# Patient Record
Sex: Female | Born: 1935 | Race: Black or African American | Hispanic: No | Smoking: Never smoker
Health system: Southern US, Community
[De-identification: ages and names within clinical notes are randomized; demographics above are authoritative.]

## PROBLEM LIST (undated history)

## (undated) DIAGNOSIS — Z9289 Personal history of other medical treatment: Secondary | ICD-10-CM

## (undated) DIAGNOSIS — R55 Syncope and collapse: Secondary | ICD-10-CM

## (undated) DIAGNOSIS — R0902 Hypoxemia: Secondary | ICD-10-CM

## (undated) DIAGNOSIS — E049 Nontoxic goiter, unspecified: Secondary | ICD-10-CM

## (undated) DIAGNOSIS — L309 Dermatitis, unspecified: Secondary | ICD-10-CM

## (undated) DIAGNOSIS — S065X9A Traumatic subdural hemorrhage with loss of consciousness of unspecified duration, initial encounter: Secondary | ICD-10-CM

## (undated) DIAGNOSIS — I480 Paroxysmal atrial fibrillation: Principal | ICD-10-CM

## (undated) DIAGNOSIS — N329 Bladder disorder, unspecified: Secondary | ICD-10-CM

## (undated) DIAGNOSIS — E039 Hypothyroidism, unspecified: Secondary | ICD-10-CM

## (undated) DIAGNOSIS — E785 Hyperlipidemia, unspecified: Secondary | ICD-10-CM

## (undated) DIAGNOSIS — M1990 Unspecified osteoarthritis, unspecified site: Secondary | ICD-10-CM

## (undated) DIAGNOSIS — S065XAA Traumatic subdural hemorrhage with loss of consciousness status unknown, initial encounter: Secondary | ICD-10-CM

## (undated) DIAGNOSIS — I495 Sick sinus syndrome: Secondary | ICD-10-CM

## (undated) HISTORY — DX: Traumatic subdural hemorrhage with loss of consciousness status unknown, initial encounter: S06.5XAA

## (undated) HISTORY — PX: APPENDECTOMY: SHX54

## (undated) HISTORY — DX: Hyperlipidemia, unspecified: E78.5

## (undated) HISTORY — PX: OTHER SURGICAL HISTORY: SHX169

## (undated) HISTORY — DX: Personal history of other medical treatment: Z92.89

## (undated) HISTORY — PX: ABDOMINAL HYSTERECTOMY: SHX81

## (undated) HISTORY — DX: Paroxysmal atrial fibrillation: I48.0

## (undated) HISTORY — DX: Traumatic subdural hemorrhage with loss of consciousness of unspecified duration, initial encounter: S06.5X9A

## (undated) HISTORY — DX: Syncope and collapse: R55

## (undated) HISTORY — DX: Hypothyroidism, unspecified: E03.9

## (undated) HISTORY — DX: Nontoxic goiter, unspecified: E04.9

## (undated) HISTORY — DX: Hypoxemia: R09.02

---

## 1998-12-26 ENCOUNTER — Encounter: Admission: RE | Admit: 1998-12-26 | Discharge: 1998-12-26 | Payer: Self-pay | Admitting: Endocrinology

## 1998-12-26 ENCOUNTER — Encounter: Payer: Self-pay | Admitting: Obstetrics

## 2000-01-29 ENCOUNTER — Encounter: Payer: Self-pay | Admitting: Endocrinology

## 2000-01-29 ENCOUNTER — Encounter: Admission: RE | Admit: 2000-01-29 | Discharge: 2000-01-29 | Payer: Self-pay | Admitting: Endocrinology

## 2001-03-10 ENCOUNTER — Encounter: Payer: Self-pay | Admitting: Endocrinology

## 2001-03-10 ENCOUNTER — Encounter: Admission: RE | Admit: 2001-03-10 | Discharge: 2001-03-10 | Payer: Self-pay | Admitting: Endocrinology

## 2001-10-07 ENCOUNTER — Encounter: Payer: Self-pay | Admitting: Endocrinology

## 2001-10-07 ENCOUNTER — Encounter: Admission: RE | Admit: 2001-10-07 | Discharge: 2001-10-07 | Payer: Self-pay | Admitting: Endocrinology

## 2001-10-20 ENCOUNTER — Other Ambulatory Visit: Admission: RE | Admit: 2001-10-20 | Discharge: 2001-10-20 | Payer: Self-pay | Admitting: Obstetrics and Gynecology

## 2001-12-24 ENCOUNTER — Encounter: Payer: Self-pay | Admitting: Urology

## 2001-12-24 ENCOUNTER — Encounter: Admission: RE | Admit: 2001-12-24 | Discharge: 2001-12-24 | Payer: Self-pay | Admitting: Urology

## 2002-02-25 ENCOUNTER — Encounter: Admission: RE | Admit: 2002-02-25 | Discharge: 2002-02-25 | Payer: Self-pay | Admitting: Endocrinology

## 2002-02-25 ENCOUNTER — Encounter: Payer: Self-pay | Admitting: Endocrinology

## 2002-03-16 ENCOUNTER — Encounter: Payer: Self-pay | Admitting: Endocrinology

## 2002-03-16 ENCOUNTER — Encounter: Admission: RE | Admit: 2002-03-16 | Discharge: 2002-03-16 | Payer: Self-pay | Admitting: Endocrinology

## 2002-12-02 ENCOUNTER — Encounter: Admission: RE | Admit: 2002-12-02 | Discharge: 2002-12-02 | Payer: Self-pay | Admitting: Endocrinology

## 2003-03-24 ENCOUNTER — Encounter: Admission: RE | Admit: 2003-03-24 | Discharge: 2003-03-24 | Payer: Self-pay | Admitting: Endocrinology

## 2004-04-23 ENCOUNTER — Ambulatory Visit (HOSPITAL_COMMUNITY): Admission: RE | Admit: 2004-04-23 | Discharge: 2004-04-23 | Payer: Self-pay | Admitting: Endocrinology

## 2004-04-24 ENCOUNTER — Encounter: Admission: RE | Admit: 2004-04-24 | Discharge: 2004-04-24 | Payer: Self-pay | Admitting: Endocrinology

## 2005-04-29 ENCOUNTER — Encounter: Admission: RE | Admit: 2005-04-29 | Discharge: 2005-04-29 | Payer: Self-pay | Admitting: Endocrinology

## 2005-07-18 ENCOUNTER — Ambulatory Visit (HOSPITAL_COMMUNITY): Admission: RE | Admit: 2005-07-18 | Discharge: 2005-07-18 | Payer: Self-pay | Admitting: Endocrinology

## 2006-05-05 ENCOUNTER — Encounter: Admission: RE | Admit: 2006-05-05 | Discharge: 2006-05-05 | Payer: Self-pay | Admitting: Endocrinology

## 2006-09-02 ENCOUNTER — Encounter: Payer: Self-pay | Admitting: Cardiology

## 2006-10-06 ENCOUNTER — Ambulatory Visit (HOSPITAL_COMMUNITY): Admission: RE | Admit: 2006-10-06 | Discharge: 2006-10-06 | Payer: Self-pay | Admitting: Endocrinology

## 2007-03-31 ENCOUNTER — Encounter: Payer: Self-pay | Admitting: Cardiology

## 2007-06-25 ENCOUNTER — Encounter: Admission: RE | Admit: 2007-06-25 | Discharge: 2007-06-25 | Payer: Self-pay | Admitting: Endocrinology

## 2008-07-27 ENCOUNTER — Ambulatory Visit (HOSPITAL_COMMUNITY): Admission: RE | Admit: 2008-07-27 | Discharge: 2008-07-27 | Payer: Self-pay | Admitting: Endocrinology

## 2008-12-22 ENCOUNTER — Encounter: Payer: Self-pay | Admitting: Cardiology

## 2009-01-04 ENCOUNTER — Encounter: Payer: Self-pay | Admitting: Cardiology

## 2009-01-05 ENCOUNTER — Encounter: Admission: RE | Admit: 2009-01-05 | Discharge: 2009-01-05 | Payer: Self-pay | Admitting: Endocrinology

## 2009-01-26 ENCOUNTER — Other Ambulatory Visit: Admission: RE | Admit: 2009-01-26 | Discharge: 2009-01-26 | Payer: Self-pay | Admitting: Endocrinology

## 2009-03-21 ENCOUNTER — Encounter: Payer: Self-pay | Admitting: Cardiology

## 2009-07-31 ENCOUNTER — Encounter: Payer: Self-pay | Admitting: Cardiology

## 2009-08-15 ENCOUNTER — Encounter: Admission: RE | Admit: 2009-08-15 | Discharge: 2009-08-15 | Payer: Self-pay | Admitting: Endocrinology

## 2010-01-05 ENCOUNTER — Encounter
Admission: RE | Admit: 2010-01-05 | Discharge: 2010-01-05 | Payer: Self-pay | Source: Home / Self Care | Attending: Endocrinology | Admitting: Endocrinology

## 2010-01-14 HISTORY — PX: CATARACT EXTRACTION W/ INTRAOCULAR LENS  IMPLANT, BILATERAL: SHX1307

## 2010-02-03 ENCOUNTER — Encounter: Payer: Self-pay | Admitting: Endocrinology

## 2010-03-07 DIAGNOSIS — R42 Dizziness and giddiness: Secondary | ICD-10-CM

## 2010-03-07 HISTORY — DX: Dizziness and giddiness: R42

## 2010-03-08 ENCOUNTER — Encounter: Payer: Self-pay | Admitting: Cardiology

## 2010-03-08 ENCOUNTER — Ambulatory Visit (INDEPENDENT_AMBULATORY_CARE_PROVIDER_SITE_OTHER): Payer: Medicare Other | Admitting: Cardiology

## 2010-03-08 DIAGNOSIS — R072 Precordial pain: Secondary | ICD-10-CM

## 2010-03-08 DIAGNOSIS — R011 Cardiac murmur, unspecified: Secondary | ICD-10-CM | POA: Insufficient documentation

## 2010-03-08 DIAGNOSIS — E785 Hyperlipidemia, unspecified: Secondary | ICD-10-CM

## 2010-03-08 DIAGNOSIS — R079 Chest pain, unspecified: Secondary | ICD-10-CM | POA: Insufficient documentation

## 2010-03-08 DIAGNOSIS — E039 Hypothyroidism, unspecified: Secondary | ICD-10-CM

## 2010-03-08 HISTORY — DX: Hypothyroidism, unspecified: E03.9

## 2010-03-08 HISTORY — DX: Hyperlipidemia, unspecified: E78.5

## 2010-03-13 NOTE — Assessment & Plan Note (Signed)
Summary: NP6 CHEST [place] MURMUR/SELF REF/[facility]/[place] [facility] [address]...   CC:  chest pressure.  History of Present Illness: 77 year old female for evaluation of chest pain. Echocardiogram in December of 2000 and showed normal LV function with an ejection fraction of 50%. There was trace mitral and tricuspid regurgitation. There was mild biatrial enlargement. Exercise tolerance test in March of 2009 was negative per report. Patient has been seen by Dr. Pauline Aus in the past. She states that she has had a "tired" feeling in her chest intermittently for some years. She noticed this recently while walking in the mall and wanted to be evaluated. She denies chest pain, pressure or tightness. She has mild dyspnea on exertion relieved with rest. There is no orthopnea, PND, pedal edema or syncope. Because of the above we were asked to further evaluate.  Preventive Screening-Counseling & Management  Alcohol-Tobacco     Smoking Status: never  Current Medications (verified): 1)  Multivitamins   Tabs (Multiple Vitamin) .Marland Kitchen.. 1 Tab By Mouth Once Daily 2)  Vit C .... 1 Tab By Mouth Once Daily 3)  Vit D .... 1 Tab By Mouth Once Daily 4)  Fish Oil   Oil (Fish Oil) .Marland Kitchen.. 1 Tab By Mouth Three Times A Day 5)  Synthroid 88 Mcg Tabs (Levothyroxine Sodium) .Marland Kitchen.. 1 Tab By Mouth Once Daily 6)  Potassium .Marland Kitchen.. 1 Tab By Mouth Three Times A Day  Past History:  Past Medical History: Hyperlipidemia Hypothyroid Goiter Murmur Cholelithiasis  Past Surgical History: Appendectomy TAH and BSO Tonsillectomy  Family History: Father with CHF  Social History: Part Time Building control surveyor Married  Tobacco Use - No.  Alcohol Use - no Smoking Status:  never  Review of Systems       Hematuria in the past which she states her physician is aware of and dizziness after eating pork but no fevers or chills, productive cough, hemoptysis, dysphasia, odynophagia, melena, hematochezia, dysuria,  rash, seizure activity,  orthopnea, PND, pedal edema, claudication. Remaining systems are negative.   Vital Signs:  Patient profile:   77 year old female Height:      67 inches Weight:      169 pounds BMI:     26.56 Pulse rate:   90 / minute Resp:     14 per minute BP sitting:   120 / 80  (right arm)  Vitals Entered By: Burnett Kanaris (March 08, 2010 11:58 AM)  Physical Exam  General:  Well developed/well nourished in NAD Skin warm/dry Patient not depressed No peripheral clubbing Back-normal HEENT-normal/normal eyelids Neck supple/normal carotid upstroke bilaterally; no bruits; no JVD; no thyromegaly chest - CTA/ normal expansion CV - RRR/normal S1 and S2; no murmurs, rubs or gallops;  PMI nondisplaced Abdomen -NT/ND, no HSM, no mass, + bowel sounds, no bruit 2+ femoral pulses, no bruits Ext-no edema, chords, 2+ DP Neuro-grossly nonfocal     EKG  Procedure date:  03/08/2010  Findings:      Sinus rhythm, occasional PVCs, left axis deviation, left atrial enlargement, RV conduction delay.  Impression & Recommendations:  Problem # 1:  CHEST PAIN (ICD-786.50) Symptoms are atypical. Schedule Myoview for risk stratification. Orders: Nuclear Stress Test (Nuc Stress Test)  Problem # 2:  DIZZINESS (ICD-780.4) Patient has had occasional dizziness in the past but only after eating pork. No further evaluation.  Problem # 3:  HYPOTHYROIDISM (ICD-244.9)  Her updated medication list for this problem includes:    Synthroid 88 Mcg Tabs (Levothyroxine sodium) .Marland Kitchen... 1 tab by mouth once  daily  Problem # 4:  HYPERLIPIDEMIA (ICD-272.4) Management per primary care.  Patient Instructions: 1)  Your physician has requested that you have an exercise stress myoview.  For further information please visit [email or web address].  Please follow instruction sheet, as given.

## 2010-03-23 ENCOUNTER — Telehealth (INDEPENDENT_AMBULATORY_CARE_PROVIDER_SITE_OTHER): Payer: Self-pay | Admitting: *Deleted

## 2010-03-26 ENCOUNTER — Ambulatory Visit (HOSPITAL_COMMUNITY): Payer: Medicare Other | Attending: Cardiology

## 2010-03-26 ENCOUNTER — Encounter: Payer: Self-pay | Admitting: Cardiology

## 2010-03-26 DIAGNOSIS — R0609 Other forms of dyspnea: Secondary | ICD-10-CM

## 2010-03-26 DIAGNOSIS — R0989 Other specified symptoms and signs involving the circulatory and respiratory systems: Secondary | ICD-10-CM

## 2010-03-26 DIAGNOSIS — R079 Chest pain, unspecified: Secondary | ICD-10-CM | POA: Insufficient documentation

## 2010-03-26 DIAGNOSIS — R0789 Other chest pain: Secondary | ICD-10-CM

## 2010-03-27 NOTE — Letter (Signed)
Summary: [facility]   Imported By: Marilynne Drivers 03/19/2010 09:57:41  _____________________________________________________________________  External Attachment:    Type:   Image     Comment:   External Document

## 2010-03-27 NOTE — Progress Notes (Signed)
Summary: Nuclear Pre-Procedure  Phone Note Outgoing Call Call back at Home Phone [phone]   Call placed by: Eliezer Lofts, EMT-P,  March 23, 2010 9:19 AM Action Taken: Phone Call Completed Summary of Call: Left message with information on Myoview Information Sheet (see scanned document for details). Eliezer Lofts, EMT-P  March 23, 2010 9:19 AM      Nuclear Med Background Indications for Stress Test: Evaluation for Ischemia   History: Echo, GXT  History Comments: GXT: (-) '00 Echo: EF=50%  Symptoms: Chest Pain, Chest Pain with Exertion, Dizziness, DOE    Nuclear Pre-Procedure Cardiac Risk Factors: Lipids Height (in): 67

## 2010-04-03 NOTE — Assessment & Plan Note (Signed)
Summary: Cardiology Nuclear Testing  Nuclear Med Background Indications for Stress Test: Evaluation for Ischemia   History: Echo, GXT, Myocardial Perfusion Study  History Comments: ?4 yrs. ago MPS:OK per patient; '09 LOV:FIEPPI; '00 Echo:EF=50%  Symptoms: Chest Pressure, Chest Pressure with Exertion, DOE  Symptoms Comments: Last episode of CP:2 weeks ago.   Nuclear Pre-Procedure Cardiac Risk Factors: Lipids Caffeine/Decaff Intake: None NPO After: 10:00 PM Lungs: Clear IV 0.9% NS with Angio Cath: 22g     IV Site: R Antecubital IV Started by: Irven Baltimore, RN Chest Size (in) 38     Cup Size B     Height (in): 67 Weight (lb): 169 BMI: 26.56  Nuclear Med Study 1 or 2 day study:  1 day     Stress Test Type:  Stress Reading MD:  Kirk Ruths, MD     Referring MD:  Kirk Ruths, MD Resting Radionuclide:  Technetium 69mTetrofosmin     Resting Radionuclide Dose:  10.8 mCi  Stress Radionuclide:  Technetium 93metrofosmin     Stress Radionuclide Dose:  33 mCi   Stress Protocol Exercise Time (min):  4:00 min     Max HR:  136 bpm     Predicted Max HR:  14951pm  Max Systolic BP: 17884m Hg     Percent Max HR:  93.79 %     METS: 5.8 Rate Pressure Product:  [id number]  Stress Test Technologist:  ShValetta FullerCMA-N     Nuclear Technologist:  ToAnnye RuskCNMT  Rest Procedure  Myocardial perfusion imaging was performed at rest 45 minutes following the intravenous administration of Technetium 9950mtrofosmin.  Stress Procedure  The patient exercised for four minutes.  The patient stopped due to fatigue and denied any chest pain, but did c/o right arm pain, 5-6/10..  Marland Kitchenhere were no significant ST-T wave changes, only frequent PVC's.  Technetium 37m58mrofosmin was injected at peak exercise and myocardial perfusion imaging was performed after a brief delay.  QPS Raw Data Images:  Acquisition technically good; normal left ventricular size. Stress Images:  There is decreased uptake in the  distal anterior wall. Rest Images:  There is decreased uptake in the distal anterior wall. Subtraction (SDS):  No evidence of ischemia. Transient Ischemic Dilatation:  .93  (Normal <1.22)  Lung/Heart Ratio:  .26  (Normal <0.45)  Quantitative Gated Spect Images QGS EDV:  74 ml QGS ESV:  22 ml QGS EF:  70 % QGS cine images:  Normal wall motion.   Overall Impression  Exercise Capacity: Fair exercise capacity. BP Response: Normal blood pressure response. Clinical Symptoms: Right arm pain. ECG Impression: No significant ST segment change suggestive of ischemia; frquent PVCs. Overall Impression: Normal stress nuclear study with mild soft tissue attenuation but no ischemia.  Appended Document: Cardiology Nuclear Testing ok  Appended Document: Cardiology Nuclear Testing pt aware of results

## 2010-04-03 NOTE — Letter (Signed)
Summary: [facility] Echo, EKG, 24 Hr Holter, myocardial Per  [facility] Echo, EKG, 24 Hr Holter, myocardial Perf 2007-2007   Imported By: Sallee Provencal 03/26/2010 09:24:34  _____________________________________________________________________  External Attachment:    Type:   Image     Comment:   External Document

## 2010-07-10 ENCOUNTER — Encounter: Payer: Self-pay | Admitting: Cardiology

## 2010-08-21 ENCOUNTER — Other Ambulatory Visit: Payer: Self-pay | Admitting: Endocrinology

## 2010-08-21 DIAGNOSIS — Z1231 Encounter for screening mammogram for malignant neoplasm of breast: Secondary | ICD-10-CM

## 2010-09-12 ENCOUNTER — Ambulatory Visit
Admission: RE | Admit: 2010-09-12 | Discharge: 2010-09-12 | Disposition: A | Discharge status: Home / Self Care | Payer: Medicare Other | Source: Ambulatory Visit | Attending: Endocrinology | Admitting: Endocrinology

## 2010-09-12 DIAGNOSIS — Z1231 Encounter for screening mammogram for malignant neoplasm of breast: Secondary | ICD-10-CM

## 2010-11-19 ENCOUNTER — Encounter (HOSPITAL_BASED_OUTPATIENT_CLINIC_OR_DEPARTMENT_OTHER): Payer: Self-pay | Admitting: *Deleted

## 2010-11-19 NOTE — Progress Notes (Signed)
NPO after MN with exception water/ gatorade until 0730 (no cream./ milk products). Pt to arrive 1215. Will take levothyroxine w/ sip of water. Needs hg . Ekg in epic ( ECG tab)

## 2010-11-21 ENCOUNTER — Ambulatory Visit (HOSPITAL_BASED_OUTPATIENT_CLINIC_OR_DEPARTMENT_OTHER)
Admission: RE | Admit: 2010-11-21 | Discharge: 2010-11-21 | Disposition: A | Discharge status: Home / Self Care | Payer: Medicare Other | Source: Ambulatory Visit | Attending: Urology | Admitting: Urology

## 2010-11-21 ENCOUNTER — Other Ambulatory Visit: Payer: Self-pay | Admitting: Urology

## 2010-11-21 ENCOUNTER — Ambulatory Visit (HOSPITAL_BASED_OUTPATIENT_CLINIC_OR_DEPARTMENT_OTHER): Payer: Medicare Other | Admitting: Anesthesiology

## 2010-11-21 ENCOUNTER — Encounter (HOSPITAL_BASED_OUTPATIENT_CLINIC_OR_DEPARTMENT_OTHER)
Admission: RE | Disposition: A | Discharge status: Home / Self Care | Payer: Self-pay | Source: Ambulatory Visit | Attending: Urology

## 2010-11-21 ENCOUNTER — Encounter (HOSPITAL_BASED_OUTPATIENT_CLINIC_OR_DEPARTMENT_OTHER): Payer: Self-pay | Admitting: Anesthesiology

## 2010-11-21 ENCOUNTER — Encounter (HOSPITAL_BASED_OUTPATIENT_CLINIC_OR_DEPARTMENT_OTHER): Payer: Self-pay | Admitting: *Deleted

## 2010-11-21 DIAGNOSIS — Z79899 Other long term (current) drug therapy: Secondary | ICD-10-CM | POA: Insufficient documentation

## 2010-11-21 DIAGNOSIS — R3129 Other microscopic hematuria: Secondary | ICD-10-CM | POA: Insufficient documentation

## 2010-11-21 DIAGNOSIS — E079 Disorder of thyroid, unspecified: Secondary | ICD-10-CM | POA: Insufficient documentation

## 2010-11-21 DIAGNOSIS — Z7982 Long term (current) use of aspirin: Secondary | ICD-10-CM | POA: Insufficient documentation

## 2010-11-21 DIAGNOSIS — R5381 Other malaise: Secondary | ICD-10-CM | POA: Insufficient documentation

## 2010-11-21 DIAGNOSIS — L539 Erythematous condition, unspecified: Secondary | ICD-10-CM | POA: Insufficient documentation

## 2010-11-21 HISTORY — DX: Unspecified osteoarthritis, unspecified site: M19.90

## 2010-11-21 HISTORY — PX: CYSTOSCOPY WITH BIOPSY: SHX5122

## 2010-11-21 HISTORY — DX: Bladder disorder, unspecified: N32.9

## 2010-11-21 HISTORY — DX: Dermatitis, unspecified: L30.9

## 2010-11-21 HISTORY — PX: CYSTOSCOPY/RETROGRADE/URETEROSCOPY: SHX5316

## 2010-11-21 LAB — POCT HEMOGLOBIN-HEMACUE: Hemoglobin: 12.3 g/dL (ref 12.0–15.0)

## 2010-11-21 SURGERY — CYSTOSCOPY, WITH BIOPSY
Anesthesia: Monitor Anesthesia Care | Site: Ureter

## 2010-11-21 MED ORDER — FENTANYL CITRATE 0.05 MG/ML IJ SOLN
INTRAMUSCULAR | Status: DC | PRN
  Administered 2010-11-21: 25 ug via INTRAVENOUS
  Administered 2010-11-21 (×2): 50 ug via INTRAVENOUS

## 2010-11-21 MED ORDER — FENTANYL CITRATE 0.05 MG/ML IJ SOLN: 25.0000 ug | INTRAMUSCULAR | Status: DC | PRN

## 2010-11-21 MED ORDER — LIDOCAINE HCL (CARDIAC) 20 MG/ML IV SOLN
INTRAVENOUS | Status: DC | PRN
  Administered 2010-11-21: 60 mg via INTRAVENOUS

## 2010-11-21 MED ORDER — CEFAZOLIN SODIUM 1-5 GM-% IV SOLN
1.0000 g | INTRAVENOUS | Status: AC
  Administered 2010-11-21: 2 g via INTRAVENOUS

## 2010-11-21 MED ORDER — STERILE WATER FOR IRRIGATION IR SOLN
Status: DC | PRN
  Administered 2010-11-21: 3000 mL

## 2010-11-21 MED ORDER — PROPOFOL 10 MG/ML IV EMUL
INTRAVENOUS | Status: DC | PRN
  Administered 2010-11-21: 200 mg via INTRAVENOUS

## 2010-11-21 MED ORDER — STERILE WATER FOR IRRIGATION IR SOLN
Status: DC | PRN
  Administered 2010-11-21: 2 mL

## 2010-11-21 MED ORDER — IOHEXOL 350 MG/ML SOLN
INTRAVENOUS | Status: DC | PRN
  Administered 2010-11-21: 10 mL

## 2010-11-21 MED ORDER — PROMETHAZINE HCL 25 MG/ML IJ SOLN: 6.2500 mg | INTRAMUSCULAR | Status: DC | PRN

## 2010-11-21 MED ORDER — ONDANSETRON HCL 4 MG/2ML IJ SOLN
INTRAMUSCULAR | Status: DC | PRN
  Administered 2010-11-21: 4 mg via INTRAVENOUS

## 2010-11-21 MED ORDER — INDIGOTINDISULFONATE SODIUM 8 MG/ML IJ SOLN
INTRAMUSCULAR | Status: DC | PRN
  Administered 2010-11-21: 3 mL

## 2010-11-21 MED ORDER — LACTATED RINGERS IV SOLN
INTRAVENOUS | Status: DC
  Administered 2010-11-21 (×2): via INTRAVENOUS

## 2010-11-21 SURGICAL SUPPLY — 21 items
BAG DRAIN URO-CYSTO SKYTR STRL (DRAIN) ×3 IMPLANT
CANISTER SUCT LVC 12 LTR MEDI- (MISCELLANEOUS) ×3 IMPLANT
CATH ROBINSON RED A/P 16FR (CATHETERS) ×3 IMPLANT
CATH URET 5FR 28IN OPEN ENDED (CATHETERS) ×3 IMPLANT
CLOTH BEACON ORANGE TIMEOUT ST (SAFETY) ×3 IMPLANT
DRAPE CAMERA CLOSED 9X96 (DRAPES) ×3 IMPLANT
ELECT REM PT RETURN 9FT ADLT (ELECTROSURGICAL) ×3
ELECTRODE REM PT RTRN 9FT ADLT (ELECTROSURGICAL) ×2 IMPLANT
GLOVE ECLIPSE 7.0 STRL STRAW (GLOVE) ×6 IMPLANT
GLOVE INDICATOR 6.5 STRL GRN (GLOVE) ×6 IMPLANT
GLOVE INDICATOR 7.5 STRL GRN (GLOVE) IMPLANT
GOWN BRE IMP SLV AUR LG STRL (GOWN DISPOSABLE) ×6 IMPLANT
GOWN STRL REIN XL XLG (GOWN DISPOSABLE) ×6 IMPLANT
GUIDEWIRE ANG ZIPWIRE 038X150 (WIRE) ×3 IMPLANT
NEEDLE HYPO 22GX1.5 SAFETY (NEEDLE) IMPLANT
NS IRRIG 500ML POUR BTL (IV SOLUTION) IMPLANT
PACK CYSTOSCOPY (CUSTOM PROCEDURE TRAY) ×3 IMPLANT
SYR 5ML LL (SYRINGE) ×3 IMPLANT
SYRINGE IRR TOOMEY STRL 70CC (SYRINGE) ×3 IMPLANT
WATER STERILE IRR 3000ML UROMA (IV SOLUTION) ×3 IMPLANT
WATER STERILE IRR 500ML POUR (IV SOLUTION) ×3 IMPLANT

## 2010-11-21 NOTE — H&P (Signed)
Chief Complaint  Hematuria   History of Present Illness 77 year old female who has a history of gross hematuria which was worked up in 2009 with a cystoscopy and a CT scan which was negative except for showing kidney stones. She has no personal history of smoking, working in a Systems developer, or exposure to dyes or rubbers. She does have a significant history of exposure to secondhand smoke from her husband for over 11 years. We discussed the risk and benefits of hematuria workup and I advised that we do this again since it has been since 2009 since her last workup. She is at increased risk given the fact that she has more than 3 red blood cells in her urine, her age, and exposure to secondhand smoke.  She had a small frondular area on office cystoscopy at the 12'o'clock position of her bladder neck on flexible cystoscopy.  She has a significant history of exposure to second hand smoke.  Her CT scan showed no findings consistent with GU malignancy.  Today I recommended a bladder biopsy. We discussed the alternatives, risks, benefits, and likelihood of achieving her goals with a bladder biopsy. Today I explained the risks of the procedure which include, but are not limited to, heart attack, stroke, pulmonary embolus, pneumonia, death, positioning injury, permanent or temporary neuropathy, bleeding, infection, need for further procedure, damage to contiguous structures, bladder perforation, hematuria, need for Foley catheter.Alternatives include surveillance with cystoscopy.  I did explain to her that it is likely that she would need to go home with the catheter as the bladder neck is very vascular area. She had the opportunity ask questions and wishes to proceed. We'll schedule her for a cystoscopy and bladder biopsy with the PK gyrus.    Past Medical History Problems  1. History of  Arthritis V13.4 2. History of  Murmurs 785.2 3. History of  Thyroid Disorder 246.9  Surgical History Problems    1. History of  Appendectomy 2. History of  Cataract Surgery Bilateral 3. History of  Gallbladder Surgery 4. History of  Hysterectomy 5. History of  Tonsillectomy  Current Meds 1. Aspirin 81 MG Oral Tablet Chewable; Therapy: (Recorded:12Feb2008) to 2. Multi-Vitamin TABS; Therapy: (Recorded:12Feb2008) to 3. Omega-3 Fish Oil CAPS; Therapy: (Recorded:26Apr2010) to 4. Potassium Citrate 10 MEQ (1080 MG) Oral Tablet Extended Release; TAKE 1 TABLET BY MOUTH  3 TIMES A DAY AFTER MEALS; Therapy: [id number] to (Evaluate:24Feb2013)  Requested for:  27Sep2012; Last VO:[id number] 5. Synthroid 88 MCG Oral Tablet; Therapy: [id number] to  Allergies Medication  1. Tylenol TABS  Family History Problems  1. Family history of  Coronary Artery Disease 2. Paternal history of  Death In The Family Father Age 19 3. Maternal history of  Death In The Family Mother Age 63 4. Family history of  Family Health Status Number Of Children 1 son 5. Family history of  Ischemic Stroke V17.1  Social History Problems  1. Marital History - Currently Married 2. Never A Smoker 3. Occupation: sub. Pharmacist, hospital Denied  4. Alcohol Use  Review of Systems Skin, eye, otolaryngeal, cardiovascular, pulmonary, endocrine, musculoskeletal, gastrointestinal and neurological system(s) were reviewed and pertinent findings if present are noted.  Constitutional: feeling tired (fatigue), but no fever and no night sweats.  Hematologic/Lymphatic: a tendency to easily bruise, but no swollen glands.  Psychiatric: anxiety, but no depression.     Physical Exam Constitutional: Well nourished and well developed.  ENT:. The ears and nose are normal in appearance. The oropharynx is normal.  Neck: The appearance of the neck is normal and no neck mass is present.  Pulmonary: No respiratory distress and clear bilateral breath sounds.  Cardiovascular: Heart rate and rhythm are normal . No peripheral edema.  Abdomen: The abdomen is soft and  nontender. No masses are palpated. The abdomen is no rebound. No CVA tenderness.  Lymphatics: The posterior cervical and supraclavicular nodes are not enlarged or tender.  Skin: Normal skin turgor and no visible rash.  Neuro/Psych:. Mood and affect are appropriate. No focal sensory deficits.     Assessment Assessed  1. Hematuria  2. Bladder Polyps   Plan Plan for cystoscopy and bladder biopsy with PK gyrus for polypoid area at the 12:00 position on the bladder neck.

## 2010-11-21 NOTE — Brief Op Note (Signed)
11/21/2010  2:41 PM  PATIENT:  Nancy Meadows P Armes  77 y.o. female  PRE-OPERATIVE DIAGNOSIS:  HEMATURIA & BLADDER LESION  POST-OPERATIVE DIAGNOSIS:  Microscopic hematuria, bladder erythema.  PROCEDURE:  Procedure(s): CYSTOSCOPY WITH BIOPSY CYSTOSCOPY/RETROGRADE/URETEROSCOPY  SURGEON:  Surgeon(s): Molli Hazard, MD  PHYSICIAN ASSISTANT:   ASSISTANTS: none   ANESTHESIA:   general  EBL:  None  BLOOD ADMINISTERED:none  DRAINS: none   LOCAL MEDICATIONS USED:  NONE  SPECIMEN:  Source of Specimen:  12 o'clock of bladder neck.  DISPOSITION OF SPECIMEN:  PATHOLOGY  COUNTS:  YES  TOURNIQUET:  * No tourniquets in log *  DICTATION: .Other Dictation: Dictation Number [id number]  PLAN OF CARE: Discharge to home after PACU  PATIENT DISPOSITION:  PACU - hemodynamically stable.   Delay start of Pharmacological VTE agent (>24hrs) due to surgical blood loss or risk of bleeding:  no

## 2010-11-21 NOTE — Anesthesia Preprocedure Evaluation (Addendum)
Anesthesia Evaluation  Patient identified by MRN, date of birth, ID band Patient awake    Reviewed: Allergy & Precautions, H&P , NPO status , Patient's Chart, lab work & pertinent test results, reviewed documented beta blocker date and time   Airway Mallampati: II TM Distance: >3 FB Neck ROM: full    Dental No notable dental hx. (+) Partial Upper and Partial Lower   Pulmonary neg pulmonary ROS,  clear to auscultation  Pulmonary exam normal       Cardiovascular Exercise Tolerance: Good neg cardio ROS - CAD + Valvular Problems/Murmurs (asymtomatic) regular Normal    Neuro/Psych Negative Neurological ROS  Negative Psych ROS   GI/Hepatic negative GI ROS, Neg liver ROS,   Endo/Other  Negative Endocrine ROS  Renal/GU negative Renal ROS  Genitourinary negative   Musculoskeletal   Abdominal   Peds  Hematology negative hematology ROS (+)   Anesthesia Other Findings   Reproductive/Obstetrics negative OB ROS                           Anesthesia Physical Anesthesia Plan  ASA: II  Anesthesia Plan: General   Post-op Pain Management:    Induction:   Airway Management Planned: LMA  Additional Equipment:   Intra-op Plan:   Post-operative Plan:   Informed Consent: I have reviewed the patients History and Physical, chart, labs and discussed the procedure including the risks, benefits and alternatives for the proposed anesthesia with the patient or authorized representative who has indicated his/her understanding and acceptance.   Dental Advisory Given and Dental advisory given  Plan Discussed with: CRNA  Anesthesia Plan Comments:        Anesthesia Quick Evaluation

## 2010-11-21 NOTE — Anesthesia Procedure Notes (Addendum)
Procedure Name: LMA Insertion Date/Time: 11/21/2010 1:33 PM Performed by: Edwyna Perfect Pre-anesthesia Checklist: Patient identified, Emergency Drugs available, Suction available and Patient being monitored Patient Re-evaluated:Patient Re-evaluated prior to inductionOxygen Delivery Method: Circle System Utilized Preoxygenation: Pre-oxygenation with 100% oxygen Intubation Type: IV induction Ventilation: Mask ventilation without difficulty LMA: LMA inserted LMA Size: 4.0 Number of attempts: 1 Placement Confirmation: positive ETCO2 and breath sounds checked- equal and bilateral Tube secured with: Tape Dental Injury: Teeth and Oropharynx as per pre-operative assessment

## 2010-11-21 NOTE — Transfer of Care (Signed)
Immediate Anesthesia Transfer of Care Note  Patient: Nancy Meadows  Procedure(s) Performed:  CYSTOSCOPY WITH BIOPSY - CYSTOSCOPY WITH BLADDER BIOPSY  AND INSTILLATION OF INDIGO CARMINE; CYSTOSCOPY/RETROGRADE/URETEROSCOPY  Patient Location: PACU  Anesthesia Type: General  Level of Consciousness: awake and alert   Airway & Oxygen Therapy: Patient Spontanous Breathing and Patient connected to face mask oxygen  Post-op Assessment: Report given to PACU RN and Post -op Vital signs reviewed and stable  Post vital signs: Reviewed and stable  Complications: No apparent anesthesia complications

## 2010-11-21 NOTE — Op Note (Signed)
NAMESERAPHIM, AFFINITO NO.:  [id number]  MEDICAL RECORD NO.:  [id number]  LOCATION:                                 FACILITY:  PHYSICIAN:  Rolan Bucco, MD    DATE OF BIRTH:  1932-07-09  DATE OF PROCEDURE:  11/21/2010 DATE OF DISCHARGE:                              OPERATIVE REPORT   SURGEON:  Rolan Bucco, MD  ASSISTANT:  None.  PREOPERATIVE DIAGNOSIS:  Microscopic hematuria.  POSTOPERATIVE DIAGNOSIS:  Microscopic hematuria.  PROCEDURES PERFORMED:  Cystoscopy, left retrograde pyelogram, vaginal exam under anesthesia, and bladder biopsy.  FINDINGS:  Small erythematous area at the 12 o'clock position on the bladder neck.  Also a dimple medial to her left ureteral orifice.  Placing contrast into this dimpled area resulted in extravasation of contrast material on fluoroscopy.  ESTIMATED BLOOD LOSS:  None.  SPECIMEN:  Bladder biopsy sent for permanent pathology.  HISTORY OF PRESENT ILLNESS:  This is a 77 year old female, who has a significant history of exposure to secondhand smoke.  Because she continued to have significant microscopic hematuria, I recommended further workup with a CT scan and cystoscopy in the office.  CT scan revealed no concerning findings, but cystoscopy in the office revealed a small frondular area at the 12 o'clock position of the bladder.  I recommended biopsy of this area.  She presents today for that biopsy.  PROCEDURE:  Informed consent was obtained.  This patient was taken to the operating room, where she was placed in supine position.  IV antibiotics were infused and general anesthesia was induced.  She was then placed in dorsal lithotomy position where genitals were prepped and draped in usual fashion.  Following this, the patient had a placement of a 12-degree rigid cystoscope through the urethra into the bladder.  The bladder was evaluated with this in a systematic fashion with the 12- degree and 70-degree lens.   There were no lesions in the bladder except for small erythematous area where there had been previous frondular lesion on an office cystoscopy at the 12 o'clock position of the bladder neck.  Evaluation of the rest of the bladder revealed left ureteral orifice with a small dimple medial to this left ureteral orifice.  It appeared that it could be a duplicated ureter; however, her CT scan did not show any duplication of her ureter or collecting system.  A angled- tip Glidewire catheter was able to be placed into this dimple and then a 5-French open-ended ureteral catheter was placed over the guidewire and the guidewire was removed.  Contrast was then placed in through the catheter and there noted to be what appeared to be extravasation of the contrast material without filling of any type of defect in the urinary system and it did not appear to be filling the bowel or any kind of GYN structure.  A extra glove was then placed on my hand and I had performed a rectal examination while manipulating this area, and did not appear to be any communication with the rectum.  After this was done, my gloves were changed, and a retrograde pyelogram was obtained on the left side, which showed the left ureter without communication  to this area, which travelled all the way up into the collecting system normally.  Next, bladder biopsy was carried out with cold cup biopsy forceps at the 12 o'clock position on the bladder neck and a Bugbee electrode was used to maintain hemostasis. Finally, a Red Robinson 16-French catheter was placed into the bladder. The bladder was drained and then filled by gravity with approximately 500 mL of normal saline with indigo carmine.  Before this was done, the vagina was packed with clean dry 4x4s.  Two 4x4s were placed into the vagina.  After the bladder was filled, it was held for several minutes and then drained and then the 4x4s were removed from the vagina and there was no  blue staining on the 4x4s.  The patient has placed back in supine position.  Anesthesia was reversed and she was taken to PACU in a stable condition.  DISCHARGE MEDICATIONS:  The patient was given discharge medications of: 1. Oxycodone 5 mg tablets, 25 total tablets. 2. Pyridium for dysuria. 3. Ciprofloxacin for antibiotic.  The patient will follow up with me in 2 days for further evaluation.          ______________________________ Rolan Bucco, MD     DW/MEDQ  D:  11/21/2010  T:  11/21/2010  Job:  [id number]

## 2010-11-22 NOTE — Anesthesia Postprocedure Evaluation (Signed)
  Anesthesia Post-op Note  Patient: Nancy Meadows  Procedure(s) Performed:  CYSTOSCOPY WITH BIOPSY - CYSTOSCOPY WITH BLADDER BIOPSY  AND INSTILLATION OF INDIGO CARMINE; CYSTOSCOPY/RETROGRADE/URETEROSCOPY  Patient Location: PACU  Anesthesia Type: General  Level of Consciousness: awake and alert   Airway and Oxygen Therapy: Patient Spontanous Breathing  Post-op Pain: mild  Post-op Assessment: Post-op Vital signs reviewed, Patient's Cardiovascular Status Stable, Respiratory Function Stable, Patent Airway and No signs of Nausea or vomiting  Post-op Vital Signs: stable  Complications: No apparent anesthesia complications

## 2010-11-26 ENCOUNTER — Encounter (HOSPITAL_BASED_OUTPATIENT_CLINIC_OR_DEPARTMENT_OTHER): Payer: Self-pay | Admitting: Urology

## 2010-12-10 ENCOUNTER — Other Ambulatory Visit: Payer: Self-pay | Admitting: Endocrinology

## 2010-12-10 DIAGNOSIS — E041 Nontoxic single thyroid nodule: Secondary | ICD-10-CM

## 2010-12-11 ENCOUNTER — Ambulatory Visit
Admission: RE | Admit: 2010-12-11 | Discharge: 2010-12-11 | Disposition: A | Discharge status: Home / Self Care | Payer: Medicare Other | Source: Ambulatory Visit | Attending: Endocrinology | Admitting: Endocrinology

## 2010-12-11 DIAGNOSIS — E041 Nontoxic single thyroid nodule: Secondary | ICD-10-CM

## 2010-12-14 ENCOUNTER — Telehealth: Payer: Self-pay | Admitting: Cardiology

## 2010-12-14 NOTE — Telephone Encounter (Signed)
LOV,Stress.12 faxed to [facility] @ [phone] KM

## 2010-12-18 ENCOUNTER — Other Ambulatory Visit: Payer: Self-pay | Admitting: Endocrinology

## 2010-12-18 DIAGNOSIS — R911 Solitary pulmonary nodule: Secondary | ICD-10-CM

## 2010-12-24 ENCOUNTER — Ambulatory Visit
Admission: RE | Admit: 2010-12-24 | Discharge: 2010-12-24 | Disposition: A | Discharge status: Home / Self Care | Payer: Medicare Other | Source: Ambulatory Visit | Attending: Endocrinology | Admitting: Endocrinology

## 2010-12-24 DIAGNOSIS — R911 Solitary pulmonary nodule: Secondary | ICD-10-CM

## 2011-09-24 ENCOUNTER — Other Ambulatory Visit: Payer: Self-pay | Admitting: Endocrinology

## 2011-09-24 DIAGNOSIS — Z1231 Encounter for screening mammogram for malignant neoplasm of breast: Secondary | ICD-10-CM

## 2011-10-11 ENCOUNTER — Ambulatory Visit: Payer: Self-pay

## 2011-10-21 ENCOUNTER — Ambulatory Visit
Admission: RE | Admit: 2011-10-21 | Discharge: 2011-10-21 | Disposition: A | Discharge status: Home / Self Care | Payer: Medicare Other | Source: Ambulatory Visit | Attending: Endocrinology | Admitting: Endocrinology

## 2011-10-21 DIAGNOSIS — Z1231 Encounter for screening mammogram for malignant neoplasm of breast: Secondary | ICD-10-CM

## 2012-02-08 ENCOUNTER — Encounter (HOSPITAL_BASED_OUTPATIENT_CLINIC_OR_DEPARTMENT_OTHER): Payer: Self-pay | Admitting: *Deleted

## 2012-02-08 ENCOUNTER — Emergency Department (HOSPITAL_BASED_OUTPATIENT_CLINIC_OR_DEPARTMENT_OTHER): Payer: Medicare Other

## 2012-02-08 ENCOUNTER — Inpatient Hospital Stay (HOSPITAL_BASED_OUTPATIENT_CLINIC_OR_DEPARTMENT_OTHER)
Admission: EM | Admit: 2012-02-08 | Discharge: 2012-02-11 | DRG: 084 | Disposition: A | Discharge status: Home / Self Care | Payer: Medicare Other | Attending: Internal Medicine | Admitting: Internal Medicine

## 2012-02-08 DIAGNOSIS — I2510 Atherosclerotic heart disease of native coronary artery without angina pectoris: Secondary | ICD-10-CM | POA: Diagnosis present

## 2012-02-08 DIAGNOSIS — W1809XA Striking against other object with subsequent fall, initial encounter: Secondary | ICD-10-CM | POA: Diagnosis present

## 2012-02-08 DIAGNOSIS — R079 Chest pain, unspecified: Secondary | ICD-10-CM

## 2012-02-08 DIAGNOSIS — R011 Cardiac murmur, unspecified: Secondary | ICD-10-CM

## 2012-02-08 DIAGNOSIS — S0990XA Unspecified injury of head, initial encounter: Secondary | ICD-10-CM

## 2012-02-08 DIAGNOSIS — E039 Hypothyroidism, unspecified: Secondary | ICD-10-CM

## 2012-02-08 DIAGNOSIS — E049 Nontoxic goiter, unspecified: Secondary | ICD-10-CM | POA: Diagnosis present

## 2012-02-08 DIAGNOSIS — R42 Dizziness and giddiness: Secondary | ICD-10-CM

## 2012-02-08 DIAGNOSIS — S066X9A Traumatic subarachnoid hemorrhage with loss of consciousness of unspecified duration, initial encounter: Secondary | ICD-10-CM | POA: Diagnosis present

## 2012-02-08 DIAGNOSIS — E785 Hyperlipidemia, unspecified: Secondary | ICD-10-CM

## 2012-02-08 DIAGNOSIS — S065X9A Traumatic subdural hemorrhage with loss of consciousness of unspecified duration, initial encounter: Secondary | ICD-10-CM

## 2012-02-08 DIAGNOSIS — R55 Syncope and collapse: Secondary | ICD-10-CM

## 2012-02-08 DIAGNOSIS — Z79899 Other long term (current) drug therapy: Secondary | ICD-10-CM

## 2012-02-08 DIAGNOSIS — S065XAA Traumatic subdural hemorrhage with loss of consciousness status unknown, initial encounter: Secondary | ICD-10-CM

## 2012-02-08 DIAGNOSIS — I609 Nontraumatic subarachnoid hemorrhage, unspecified: Secondary | ICD-10-CM

## 2012-02-08 NOTE — ED Notes (Signed)
Pt states she was outside earlier today around 5p, bent over and got dizzy. +LOC. Hit back of head on cement. Now c/o pain to same. PERL

## 2012-02-08 NOTE — ED Provider Notes (Addendum)
History   This chart was scribed for Moriyah Byington Alfonso Patten, MD scribed by Mitchell Heir. The patient was seen in room MH01/MH01 at 23:07   CSN: [id number]  Arrival date & time 02/08/12  2205   None     Chief Complaint  Patient presents with  . Fall    (Consider location/radiation/quality/duration/timing/severity/associated sxs/prior treatment) The history is provided by the patient and a relative. No language interpreter was used.   Nancy Meadows is a 79 y.o. female who presents to the Emergency Department for EVAL following a fall that occurred this evening. She says that she was outside today and was bent over to pick up something when she got dizzy and fell over. She says she LOC and that she woke up with a bump on her head. She notes she has only taken a bayer asa today. The patient notes that she took 25 mg meclizine a long time before her fall, which she takes to treat for dizziness.   Patient states she does not have spinning sensation.  She denies weakness numbness no CP no SOB.  No vomiting  Past Medical History  Diagnosis Date  . HLD (hyperlipidemia)   . Hypothyroid   . Goiter   . Cholelithiasis   . Murmur   . Coronary artery disease     cardiologist- dr Stanford Breed-- last visit  feb'12  note ,ekg &stress test(04-03-10)  in epic  . Arthritis     ddd-- occ. pain  . Eczema   . Lesion of bladder     hematuria/ frequency    Past Surgical History  Procedure Date  . Appendectomy age 89  . Abdominal hysterectomy age 18  . Bilateral salpingoophectomy age 9  . Gallstones removed age 67  . Cataract extraction w/ intraocular lens  implant, bilateral 2012  . Cystoscopy with biopsy 11/21/2010    Procedure: CYSTOSCOPY WITH BIOPSY;  Surgeon: Molli Hazard, MD;  Location: [facility];  Service: Urology;  Laterality: N/A;  CYSTOSCOPY WITH BLADDER BIOPSY  AND INSTILLATION OF INDIGO CARMINE  . Cystoscopy/retrograde/ureteroscopy 11/21/2010    Procedure:  CYSTOSCOPY/RETROGRADE/URETEROSCOPY;  Surgeon: Molli Hazard, MD;  Location: [facility];  Service: Urology;  Laterality: Left;    History reviewed. No pertinent family history.  History  Substance Use Topics  . Smoking status: Never Smoker   . Smokeless tobacco: Never Used     Comment: tobacco use - no  . Alcohol Use: No   Review of Systems  Neurological: Positive for dizziness, syncope and headaches. Negative for speech difficulty and numbness.  All other systems reviewed and are negative.    Allergies  Acetaminophen  Home Medications   Current Outpatient Rx  Name  Route  Sig  Dispense  Refill  . OMEGA-3 FATTY ACIDS 1000 MG PO CAPS   Oral   Take 1,200 mg by mouth daily. Takes sometimes         . LEVOTHYROXINE SODIUM 88 MCG PO TABS   Oral   Take 88 mcg by mouth every morning.          Marland Kitchen ONE-DAILY MULTI VITAMINS PO TABS   Oral   Take 1 tablet by mouth daily.          Marland Kitchen BIOTIN PLUS/CALCIUM/VIT D3 PO TABS   Oral   Take by mouth once a week. Dosage unknown          . POTASSIUM PO   Oral   Take 10 mEq by mouth 3 (  three) times daily.            BP 135/76  Pulse 90  Temp 98.3 F (36.8 C) (Oral)  Resp 20  Ht 5' 7.5" (1.715 m)  Wt 170 lb (77.111 kg)  BMI 26.23 kg/m2  SpO2 99%  Physical Exam  Nursing note and vitals reviewed. Constitutional: She is oriented to person, place, and time. She appears well-developed and well-nourished. No distress.  HENT:  Head: Normocephalic and atraumatic.  Right Ear: No hemotympanum.  Left Ear: No hemotympanum.  Mouth/Throat: Oropharynx is clear and moist. No oropharyngeal exudate.       No cephalohematoma.   Eyes: EOM are normal. Pupils are equal, round, and reactive to light.  Neck: Normal range of motion. Neck supple. Carotid bruit is not present. No tracheal deviation present.  Cardiovascular: Normal rate, regular rhythm and normal heart sounds.   No murmur heard. Pulmonary/Chest: Effort  normal and breath sounds normal. No stridor. No respiratory distress. She has no wheezes. She has no rales.       Lungs bilaterally  Abdominal: Soft. Bowel sounds are normal. She exhibits no distension. There is no tenderness. There is no rebound and no guarding.  Musculoskeletal: Normal range of motion. She exhibits no edema and no tenderness.       No c-spine tenderness.   Neurological: She is alert and oriented to person, place, and time. She has normal reflexes. No cranial nerve deficit or sensory deficit. She exhibits normal muscle tone.  Skin: Skin is warm and dry.  Psychiatric: She has a normal mood and affect. Her behavior is normal.    ED Course  Procedures (including critical care time) DIAGNOSTIC STUDIES: Oxygen Saturation is 99% on room air, normal by my interpretation.    COORDINATION OF CARE: 23:08: Physical exam performed.  23:50: Consult with Dr. Saintclair Halsted with neurosurgery complete and patient case discussed. Agrees to admit for further work-up.  23:58: Consult complete. Discussed with Dr. Nicole Kindred with neurology. States no need for stroke work-up and agrees to admit for further treatment.  00:25: Dr. Arnoldo Morale, admitting provider agreed to admit at team10 step down at [place] Contrast  02/08/2012  *RADIOLOGY REPORT*  Clinical Data:  Fall.  CT HEAD WITHOUT CONTRAST CT CERVICAL SPINE WITHOUT CONTRAST  Technique:  Multidetector CT imaging of the head and cervical spine was performed following the standard protocol without intravenous contrast.  Multiplanar CT image reconstructions of the cervical spine were also generated.  Comparison:   None  CT HEAD  Findings: Moderate to large sized left occipital subcutaneous hematoma.  No underlying skull fracture.  Small amount of subarachnoid blood/cortical parenchymal blood posterior left frontal - parietal junction.  Small left tentorial subdural hematoma.  No CT evidence of large acute infarct.  No hydrocephalus. No intracranial  mass lesion detected on this unenhanced exam.  IMPRESSION: Moderate to large sized left occipital subcutaneous hematoma.  No underlying skull fracture.  Small amount of subarachnoid blood/cortical parenchymal blood posterior left frontal - parietal junction.  Small left tentorial subdural hematoma.  CT CERVICAL SPINE  Findings: No evidence of cervical spine fracture.  Slightly blunted appearance of the left C5 superior facet but with no adjacent fracture fragment noted.  Cervical spondylotic changes with various degrees of spinal stenosis and foraminal narrowing.  Anterior slip of C7 felt to be related to the facet joint degenerative changes.  No abnormal prevertebral soft tissue swelling.  No apical pneumothorax.  Carotid bifurcation calcifications.  IMPRESSION: No cervical  spine fracture.  Please see above.  Critical Value/emergent results were called by telephone at the time of interpretation on 02/08/2012 at 11:37 p.m. to Dr. Randal Buba., who verbally acknowledged these results.   Original Report Authenticated By: Genia Del, M.D.    Ct Cervical Spine Wo Contrast  02/08/2012  *RADIOLOGY REPORT*  Clinical Data:  Fall.  CT HEAD WITHOUT CONTRAST CT CERVICAL SPINE WITHOUT CONTRAST  Technique:  Multidetector CT imaging of the head and cervical spine was performed following the standard protocol without intravenous contrast.  Multiplanar CT image reconstructions of the cervical spine were also generated.  Comparison:   None  CT HEAD  Findings: Moderate to large sized left occipital subcutaneous hematoma.  No underlying skull fracture.  Small amount of subarachnoid blood/cortical parenchymal blood posterior left frontal - parietal junction.  Small left tentorial subdural hematoma.  No CT evidence of large acute infarct.  No hydrocephalus. No intracranial mass lesion detected on this unenhanced exam.  IMPRESSION: Moderate to large sized left occipital subcutaneous hematoma.  No underlying skull fracture.  Small amount  of subarachnoid blood/cortical parenchymal blood posterior left frontal - parietal junction.  Small left tentorial subdural hematoma.  CT CERVICAL SPINE  Findings: No evidence of cervical spine fracture.  Slightly blunted appearance of the left C5 superior facet but with no adjacent fracture fragment noted.  Cervical spondylotic changes with various degrees of spinal stenosis and foraminal narrowing.  Anterior slip of C7 felt to be related to the facet joint degenerative changes.  No abnormal prevertebral soft tissue swelling.  No apical pneumothorax.  Carotid bifurcation calcifications.  IMPRESSION: No cervical spine fracture.  Please see above.  Critical Value/emergent results were called by telephone at the time of interpretation on 02/08/2012 at 11:37 p.m. to Dr. Randal Buba., who verbally acknowledged these results.   Original Report Authenticated By: Genia Del, M.D.      No diagnosis found.    MDM   Date: 02/09/2012  Rate: 73  Rhythm: normal sinus rhythm  QRS Axis: normal  Intervals: PR prolonged  ST/T Wave abnormalities: normal  Conduction Disutrbances:first-degree A-V block   Narrative Interpretation:   Old EKG Reviewed: none available   I personally performed the services described in this documentation, which was scribed in my presence. The recorded information has been reviewed and is accurate.  CRITICAL CARE Performed by: Carlisle Beers   Total critical care time: 60 minutes  Critical care time was exclusive of separately billable procedures and treating other patients.  Critical care was necessary to treat or prevent imminent or life-threatening deterioration.  Critical care was time spent personally by me on the following activities: development of treatment plan with patient and/or surrogate as well as nursing, discussions with consultants, evaluation of patient's response to treatment, examination of patient, obtaining history from patient or surrogate, ordering  and performing treatments and interventions, ordering and review of laboratory studies, ordering and review of radiographic studies, pulse oximetry and re-evaluation of patient's condition.    MDM Reviewed: nursing note and vitals Interpretation: labs, ECG, x-ray and CT scan Total time providing critical care: 30-74 minutes. This excludes time spent performing separately reportable procedures and services. Consults: neurosurgery, neurology and admitting MD     Annya Lizana K Cartha Rotert-Rasch, MD 02/09/12 [phone]

## 2012-02-09 ENCOUNTER — Emergency Department (HOSPITAL_BASED_OUTPATIENT_CLINIC_OR_DEPARTMENT_OTHER): Payer: Medicare Other

## 2012-02-09 DIAGNOSIS — S0990XA Unspecified injury of head, initial encounter: Secondary | ICD-10-CM | POA: Diagnosis present

## 2012-02-09 DIAGNOSIS — I6200 Nontraumatic subdural hemorrhage, unspecified: Secondary | ICD-10-CM

## 2012-02-09 DIAGNOSIS — R55 Syncope and collapse: Secondary | ICD-10-CM | POA: Diagnosis present

## 2012-02-09 DIAGNOSIS — R42 Dizziness and giddiness: Secondary | ICD-10-CM

## 2012-02-09 DIAGNOSIS — I609 Nontraumatic subarachnoid hemorrhage, unspecified: Secondary | ICD-10-CM | POA: Diagnosis present

## 2012-02-09 DIAGNOSIS — S065X9A Traumatic subdural hemorrhage with loss of consciousness of unspecified duration, initial encounter: Secondary | ICD-10-CM

## 2012-02-09 DIAGNOSIS — S065XAA Traumatic subdural hemorrhage with loss of consciousness status unknown, initial encounter: Secondary | ICD-10-CM

## 2012-02-09 HISTORY — DX: Unspecified injury of head, initial encounter: S09.90XA

## 2012-02-09 HISTORY — DX: Nontraumatic subarachnoid hemorrhage, unspecified: I60.9

## 2012-02-09 HISTORY — DX: Traumatic subdural hemorrhage with loss of consciousness status unknown, initial encounter: S06.5XAA

## 2012-02-09 HISTORY — DX: Traumatic subdural hemorrhage with loss of consciousness of unspecified duration, initial encounter: S06.5X9A

## 2012-02-09 LAB — CBC WITH DIFFERENTIAL/PLATELET
Eosinophils Absolute: 0.1 10*3/uL (ref 0.0–0.7)
Eosinophils Relative: 1 % (ref 0–5)
Lymphs Abs: 2.7 10*3/uL (ref 0.7–4.0)
MCH: 31 pg (ref 26.0–34.0)
MCV: 93.1 fL (ref 78.0–100.0)
Monocytes Absolute: 0.8 10*3/uL (ref 0.1–1.0)
Monocytes Relative: 9 % (ref 3–12)
Platelets: 160 10*3/uL (ref 150–400)
RBC: 3.93 MIL/uL (ref 3.87–5.11)

## 2012-02-09 LAB — COMPREHENSIVE METABOLIC PANEL
BUN: 20 mg/dL (ref 6–23)
Calcium: 10.2 mg/dL (ref 8.4–10.5)
Creatinine, Ser: 1.1 mg/dL (ref 0.50–1.10)
GFR calc Af Amer: 55 mL/min — ABNORMAL LOW (ref >90)
Glucose, Bld: 109 mg/dL — ABNORMAL HIGH (ref 70–99)
Sodium: 140 mEq/L (ref 135–145)
Total Protein: 8 g/dL (ref 6.0–8.3)

## 2012-02-09 LAB — URINE MICROSCOPIC-ADD ON

## 2012-02-09 LAB — URINALYSIS, ROUTINE W REFLEX MICROSCOPIC
Leukocytes, UA: NEGATIVE
Nitrite: NEGATIVE
Specific Gravity, Urine: 1.014 (ref 1.005–1.030)
Urobilinogen, UA: 0.2 mg/dL (ref 0.0–1.0)
pH: 5 (ref 5.0–8.0)

## 2012-02-09 MED ORDER — ONDANSETRON HCL 4 MG/2ML IJ SOLN: 4.0000 mg | Freq: Four times a day (QID) | INTRAMUSCULAR | Status: DC | PRN

## 2012-02-09 MED ORDER — SODIUM CHLORIDE 0.9 % IJ SOLN
3.0000 mL | Freq: Two times a day (BID) | INTRAMUSCULAR | Status: DC
  Administered 2012-02-09 – 2012-02-11 (×4): 3 mL via INTRAVENOUS

## 2012-02-09 MED ORDER — SODIUM CHLORIDE 0.9 % IJ SOLN: 3.0000 mL | INTRAMUSCULAR | Status: DC | PRN

## 2012-02-09 MED ORDER — MORPHINE SULFATE 2 MG/ML IJ SOLN: 1.0000 mg | INTRAMUSCULAR | Status: DC | PRN

## 2012-02-09 MED ORDER — OXYCODONE HCL 5 MG PO TABS: 5.0000 mg | ORAL_TABLET | ORAL | Status: DC | PRN

## 2012-02-09 MED ORDER — CYCLOSPORINE 0.05 % OP EMUL
1.0000 [drp] | Freq: Every day | OPHTHALMIC | Status: DC
  Administered 2012-02-09 – 2012-02-11 (×3): 1 [drp] via OPHTHALMIC
  Filled 2012-02-09 (×3): qty 1

## 2012-02-09 MED ORDER — HYDROMORPHONE HCL PF 1 MG/ML IJ SOLN: 1.0000 mg | INTRAMUSCULAR | Status: DC | PRN

## 2012-02-09 MED ORDER — ONDANSETRON HCL 4 MG PO TABS: 4.0000 mg | ORAL_TABLET | Freq: Four times a day (QID) | ORAL | Status: DC | PRN

## 2012-02-09 MED ORDER — HYDROMORPHONE HCL PF 1 MG/ML IJ SOLN: 0.5000 mg | INTRAMUSCULAR | Status: DC | PRN

## 2012-02-09 MED ORDER — TRAMADOL HCL 50 MG PO TABS
25.0000 mg | ORAL_TABLET | Freq: Four times a day (QID) | ORAL | Status: DC | PRN
  Administered 2012-02-10: 25 mg via ORAL
  Filled 2012-02-09: qty 1

## 2012-02-09 MED ORDER — LEVOTHYROXINE SODIUM 88 MCG PO TABS
88.0000 ug | ORAL_TABLET | Freq: Every day | ORAL | Status: DC
  Administered 2012-02-09 – 2012-02-11 (×3): 88 ug via ORAL
  Filled 2012-02-09 (×4): qty 1

## 2012-02-09 MED ORDER — SODIUM CHLORIDE 0.9 % IV SOLN: 250.0000 mL | INTRAVENOUS | Status: DC | PRN

## 2012-02-09 NOTE — ED Notes (Signed)
Assigned to room 3106 @ [facility], [facility] called for transport, RN notified.

## 2012-02-09 NOTE — H&P (Signed)
Chief Complaint:  Passed out  HPI: 79 yo female overall healthy was cleaning her bbq pit when she was bending over and got dizzy and passed out, hit her head.  She has been in her normal state of health.  No fevers.  No headache prior to syncopal event.  No cp.  No vision changes.  She is not having any neurological deficits now, denies any n/t anywhere or weakness.  Denies headache or vision changes now.  No n/v.  No le edema or swelling.  Took 2 baby asa yesterday and takes this inconsistently not daily.  Review of Systems:  O/w neg  Past Medical History: Past Medical History  Diagnosis Date  . HLD (hyperlipidemia)   . Hypothyroid   . Goiter   . Cholelithiasis   . Murmur   . Coronary artery disease     cardiologist- dr Stanford Breed-- last visit  feb'12  note ,ekg &stress test(04-03-10)  in epic  . Arthritis     ddd-- occ. pain  . Eczema   . Lesion of bladder     hematuria/ frequency   Past Surgical History  Procedure Date  . Appendectomy age 72  . Abdominal hysterectomy age 26  . Bilateral salpingoophectomy age 49  . Gallstones removed age 89  . Cataract extraction w/ intraocular lens  implant, bilateral 2012  . Cystoscopy with biopsy 11/21/2010    Procedure: CYSTOSCOPY WITH BIOPSY;  Surgeon: Molli Hazard, MD;  Location: [facility];  Service: Urology;  Laterality: N/A;  CYSTOSCOPY WITH BLADDER BIOPSY  AND INSTILLATION OF INDIGO CARMINE  . Cystoscopy/retrograde/ureteroscopy 11/21/2010    Procedure: CYSTOSCOPY/RETROGRADE/URETEROSCOPY;  Surgeon: Molli Hazard, MD;  Location: [facility];  Service: Urology;  Laterality: Left;    Medications: Prior to Admission medications   Medication Sig Start Date End Date Taking? Authorizing Provider  meclizine (ANTIVERT) 25 MG tablet Take 25 mg by mouth 3 (three) times daily as needed.   Yes Historical Provider, MD  fish oil-omega-3 fatty acids 1000 MG capsule Take 1,200 mg by mouth daily. Takes  sometimes    Historical Provider, MD  levothyroxine (SYNTHROID, LEVOTHROID) 88 MCG tablet Take 88 mcg by mouth every morning.     Historical Provider, MD  Multiple Vitamin (MULTIVITAMIN) tablet Take 1 tablet by mouth daily.     Historical Provider, MD  Multiple Vitamins-Minerals (BITOIN PLUS/CALCIUM/VIT D3) TABS Take by mouth once a week. Dosage unknown     Historical Provider, MD  POTASSIUM PO Take 10 mEq by mouth 3 (three) times daily.     Historical Provider, MD    Allergies:   Allergies  Allergen Reactions  . Acetaminophen Other (See Comments)    REACTION: Syncope    Social History:  reports that she has never smoked. She has never used smokeless tobacco. She reports that she does not drink alcohol or use illicit drugs.  Family History: History reviewed. No pertinent family history.  Physical Exam: Filed Vitals:   02/09/12 0039 02/09/12 0110 02/09/12 0210 02/09/12 0300  BP:  142/72 132/69 121/60  Pulse:  71 77 80  Temp: 98.3 F (36.8 C) 98 F (36.7 C)    TempSrc:  Oral    Resp:   16 19  Height:      Weight:      SpO2:  99% 100% 98%   General appearance: alert, cooperative and no distress Neck: no JVD and supple, symmetrical, trachea midline Lungs: clear to auscultation bilaterally Heart: regular rate and rhythm,  S1, S2 normal, no murmur, click, rub or gallop Abdomen: soft, non-tender; bowel sounds normal; no masses,  no organomegaly Extremities: extremities normal, atraumatic, no cyanosis or edema Pulses: 2+ and symmetric Skin: Skin color, texture, turgor normal. No rashes or lesions Neurologic: Grossly normal strength normal.  Vision normal.      Labs on Admission:   [facility] 02/09/12 0020  NA 140  K 4.1  CL 103  CO2 27  GLUCOSE 109*  BUN 20  CREATININE 1.10  CALCIUM 10.2  MG --  PHOS --    Basename 02/09/12 0020  AST 21  ALT 16  ALKPHOS 84  BILITOT 0.3  PROT 8.0  ALBUMIN 3.8    Basename 02/09/12 0020  WBC 8.8  NEUTROABS 5.3  HGB 12.2    HCT 36.6  MCV 93.1  PLT 160    Basename 02/09/12 0020  CKTOTAL --  CKMB --  CKMBINDEX --  TROPONINI <0.30   Radiological Exams on Admission: Dg Chest 2 View  02/09/2012  *RADIOLOGY REPORT*  Clinical Data: Syncope; dizziness.  Concern for chest injury.  CHEST - 2 VIEW  Comparison: Chest radiograph performed 12/29/2008, and CT of the chest performed 12/24/2010  Findings: The lungs are mildly hypoexpanded; mild bibasilar atelectasis is noted.  There is no evidence of pleural effusion or pneumothorax.  The heart is normal in size; the mediastinal contour is within normal limits.  No acute osseous abnormalities are seen.  IMPRESSION: Lungs mildly hypoexpanded, with mild bibasilar atelectasis.   Original Report Authenticated By: Santa Lighter, M.D.    Ct Head Wo Contrast  02/08/2012  *RADIOLOGY REPORT*  Clinical Data:  Fall.  CT HEAD WITHOUT CONTRAST CT CERVICAL SPINE WITHOUT CONTRAST  Technique:  Multidetector CT imaging of the head and cervical spine was performed following the standard protocol without intravenous contrast.  Multiplanar CT image reconstructions of the cervical spine were also generated.  Comparison:   None  CT HEAD  Findings: Moderate to large sized left occipital subcutaneous hematoma.  No underlying skull fracture.  Small amount of subarachnoid blood/cortical parenchymal blood posterior left frontal - parietal junction.  Small left tentorial subdural hematoma.  No CT evidence of large acute infarct.  No hydrocephalus. No intracranial mass lesion detected on this unenhanced exam.  IMPRESSION: Moderate to large sized left occipital subcutaneous hematoma.  No underlying skull fracture.  Small amount of subarachnoid blood/cortical parenchymal blood posterior left frontal - parietal junction.  Small left tentorial subdural hematoma.  CT CERVICAL SPINE  Findings: No evidence of cervical spine fracture.  Slightly blunted appearance of the left C5 superior facet but with no adjacent  fracture fragment noted.  Cervical spondylotic changes with various degrees of spinal stenosis and foraminal narrowing.  Anterior slip of C7 felt to be related to the facet joint degenerative changes.  No abnormal prevertebral soft tissue swelling.  No apical pneumothorax.  Carotid bifurcation calcifications.  IMPRESSION: No cervical spine fracture.  Please see above.  Critical Value/emergent results were called by telephone at the time of interpretation on 02/08/2012 at 11:37 p.m. to Dr. Randal Buba., who verbally acknowledged these results.   Original Report Authenticated By: Genia Del, M.D.    Ct Cervical Spine Wo Contrast  02/08/2012  *RADIOLOGY REPORT*  Clinical Data:  Fall.  CT HEAD WITHOUT CONTRAST CT CERVICAL SPINE WITHOUT CONTRAST  Technique:  Multidetector CT imaging of the head and cervical spine was performed following the standard protocol without intravenous contrast.  Multiplanar CT image reconstructions of the cervical spine were  also generated.  Comparison:   None  CT HEAD  Findings: Moderate to large sized left occipital subcutaneous hematoma.  No underlying skull fracture.  Small amount of subarachnoid blood/cortical parenchymal blood posterior left frontal - parietal junction.  Small left tentorial subdural hematoma.  No CT evidence of large acute infarct.  No hydrocephalus. No intracranial mass lesion detected on this unenhanced exam.  IMPRESSION: Moderate to large sized left occipital subcutaneous hematoma.  No underlying skull fracture.  Small amount of subarachnoid blood/cortical parenchymal blood posterior left frontal - parietal junction.  Small left tentorial subdural hematoma.  CT CERVICAL SPINE  Findings: No evidence of cervical spine fracture.  Slightly blunted appearance of the left C5 superior facet but with no adjacent fracture fragment noted.  Cervical spondylotic changes with various degrees of spinal stenosis and foraminal narrowing.  Anterior slip of C7 felt to be related to  the facet joint degenerative changes.  No abnormal prevertebral soft tissue swelling.  No apical pneumothorax.  Carotid bifurcation calcifications.  IMPRESSION: No cervical spine fracture.  Please see above.  Critical Value/emergent results were called by telephone at the time of interpretation on 02/08/2012 at 11:37 p.m. to Dr. Randal Buba., who verbally acknowledged these results.   Original Report Authenticated By: Genia Del, M.D.     Assessment/Plan  79 yo female with syncopal event resulting in head trauma/subdural hematoma and subarachnoid hemorrhage Principal Problem:  *Acute head trauma Active Problems:  Syncope  Subdural hematoma, acute  SAH (subarachnoid hemorrhage)  No neurological deficits.  Hold all asa products and anticoagulants.  Neurosurgery following.  Repeat ct head in approx 24 hours.  Will also do syncopal w/u with 2d cardiac echo, ck orthostatics and serial enzymes with cardiac monitoring.  Also carotid dopplers.  Keep in stepdown overnight with frequent neuro checks.  ekg nsr 1st avb and irbbb.    Makayla Confer A 02/09/2012, 3:41 AM

## 2012-02-09 NOTE — Consult Note (Signed)
Reason for Consult: Fall closed head injury subarachnoid and subdural hematoma Referring Physician: Emergency department  Nancy Meadows is an 79 y.o. female.  HPI: Patient is 79 year old female who apparently had a feeling of dizziness or vertigo earlier took some meclizine significantly made her more confused dizzy and she passed out striking the back of her head. She is amnestic for most the event she was taken to [facility] was evaluated noted to be neurologically intact a CT scan revealed a small amount of subarachnoid hemorrhage and tentorial subdural as well as a supple hematoma this is been transferred for workup of syncope  Past Medical History  Diagnosis Date  . HLD (hyperlipidemia)   . Hypothyroid   . Goiter   . Cholelithiasis   . Murmur   . Coronary artery disease     cardiologist- dr Stanford Breed-- last visit  feb'12  note ,ekg &stress test(04-03-10)  in epic  . Arthritis     ddd-- occ. pain  . Eczema   . Lesion of bladder     hematuria/ frequency    Past Surgical History  Procedure Date  . Appendectomy age 10  . Abdominal hysterectomy age 88  . Bilateral salpingoophectomy age 10  . Gallstones removed age 66  . Cataract extraction w/ intraocular lens  implant, bilateral 2012  . Cystoscopy with biopsy 11/21/2010    Procedure: CYSTOSCOPY WITH BIOPSY;  Surgeon: Molli Hazard, MD;  Location: [facility];  Service: Urology;  Laterality: N/A;  CYSTOSCOPY WITH BLADDER BIOPSY  AND INSTILLATION OF INDIGO CARMINE  . Cystoscopy/retrograde/ureteroscopy 11/21/2010    Procedure: CYSTOSCOPY/RETROGRADE/URETEROSCOPY;  Surgeon: Molli Hazard, MD;  Location: [facility];  Service: Urology;  Laterality: Left;    History reviewed. No pertinent family history.  Social History:  reports that she has never smoked. She has never used smokeless tobacco. She reports that she does not drink alcohol or use illicit drugs.  Allergies:    Allergies  Allergen Reactions  . Acetaminophen Other (See Comments)    REACTION: Syncope    Medications: I have reviewed the patient's current medications.  Results for orders placed during the hospital encounter of 02/08/12 (from the past 48 hour(s))  URINALYSIS, ROUTINE W REFLEX MICROSCOPIC     Status: Abnormal   Collection Time   02/09/12 12:17 AM      Component Value Range Comment   Color, Urine YELLOW  YELLOW    APPearance CLEAR  CLEAR    Specific Gravity, Urine 1.014  1.005 - 1.030    pH 5.0  5.0 - 8.0    Glucose, UA NEGATIVE  NEGATIVE mg/dL    Hgb urine dipstick MODERATE (*) NEGATIVE    Bilirubin Urine NEGATIVE  NEGATIVE    Ketones, ur NEGATIVE  NEGATIVE mg/dL    Protein, ur NEGATIVE  NEGATIVE mg/dL    Urobilinogen, UA 0.2  0.0 - 1.0 mg/dL    Nitrite NEGATIVE  NEGATIVE    Leukocytes, UA NEGATIVE  NEGATIVE   URINE MICROSCOPIC-ADD ON     Status: Normal   Collection Time   02/09/12 12:17 AM      Component Value Range Comment   Squamous Epithelial / LPF RARE  RARE    WBC, UA 0-2  <3 WBC/hpf    RBC / HPF 0-2  <3 RBC/hpf   CBC WITH DIFFERENTIAL     Status: Normal   Collection Time   02/09/12 12:20 AM      Component Value Range  Comment   WBC 8.8  4.0 - 10.5 K/uL    RBC 3.93  3.87 - 5.11 MIL/uL    Hemoglobin 12.2  12.0 - 15.0 g/dL    HCT 36.6  36.0 - 46.0 %    MCV 93.1  78.0 - 100.0 fL    MCH 31.0  26.0 - 34.0 pg    MCHC 33.3  30.0 - 36.0 g/dL    RDW 13.9  11.5 - 15.5 %    Platelets 160  150 - 400 K/uL    Neutrophils Relative 60  43 - 77 %    Neutro Abs 5.3  1.7 - 7.7 K/uL    Lymphocytes Relative 30  12 - 46 %    Lymphs Abs 2.7  0.7 - 4.0 K/uL    Monocytes Relative 9  3 - 12 %    Monocytes Absolute 0.8  0.1 - 1.0 K/uL    Eosinophils Relative 1  0 - 5 %    Eosinophils Absolute 0.1  0.0 - 0.7 K/uL    Basophils Relative 0  0 - 1 %    Basophils Absolute 0.0  0.0 - 0.1 K/uL   COMPREHENSIVE METABOLIC PANEL     Status: Abnormal   Collection Time   02/09/12 12:20 AM       Component Value Range Comment   Sodium 140  135 - 145 mEq/L    Potassium 4.1  3.5 - 5.1 mEq/L    Chloride 103  96 - 112 mEq/L    CO2 27  19 - 32 mEq/L    Glucose, Bld 109 (*) 70 - 99 mg/dL    BUN 20  6 - 23 mg/dL    Creatinine, Ser 1.10  0.50 - 1.10 mg/dL    Calcium 10.2  8.4 - 10.5 mg/dL    Total Protein 8.0  6.0 - 8.3 g/dL    Albumin 3.8  3.5 - 5.2 g/dL    AST 21  0 - 37 U/L    ALT 16  0 - 35 U/L    Alkaline Phosphatase 84  39 - 117 U/L    Total Bilirubin 0.3  0.3 - 1.2 mg/dL    GFR calc non Af Amer 47 (*) >90 mL/min    GFR calc Af Amer 55 (*) >90 mL/min   TROPONIN I     Status: Normal   Collection Time   02/09/12 12:20 AM      Component Value Range Comment   Troponin I <0.30  <0.30 ng/mL     Dg Chest 2 View  02/09/2012  *RADIOLOGY REPORT*  Clinical Data: Syncope; dizziness.  Concern for chest injury.  CHEST - 2 VIEW  Comparison: Chest radiograph performed 12/29/2008, and CT of the chest performed 12/24/2010  Findings: The lungs are mildly hypoexpanded; mild bibasilar atelectasis is noted.  There is no evidence of pleural effusion or pneumothorax.  The heart is normal in size; the mediastinal contour is within normal limits.  No acute osseous abnormalities are seen.  IMPRESSION: Lungs mildly hypoexpanded, with mild bibasilar atelectasis.   Original Report Authenticated By: Santa Lighter, M.D.    Ct Head Wo Contrast  02/08/2012  *RADIOLOGY REPORT*  Clinical Data:  Fall.  CT HEAD WITHOUT CONTRAST CT CERVICAL SPINE WITHOUT CONTRAST  Technique:  Multidetector CT imaging of the head and cervical spine was performed following the standard protocol without intravenous contrast.  Multiplanar CT image reconstructions of the cervical spine were also generated.  Comparison:   None  CT HEAD  Findings: Moderate to large sized left occipital subcutaneous hematoma.  No underlying skull fracture.  Small amount of subarachnoid blood/cortical parenchymal blood posterior left frontal - parietal junction.   Small left tentorial subdural hematoma.  No CT evidence of large acute infarct.  No hydrocephalus. No intracranial mass lesion detected on this unenhanced exam.  IMPRESSION: Moderate to large sized left occipital subcutaneous hematoma.  No underlying skull fracture.  Small amount of subarachnoid blood/cortical parenchymal blood posterior left frontal - parietal junction.  Small left tentorial subdural hematoma.  CT CERVICAL SPINE  Findings: No evidence of cervical spine fracture.  Slightly blunted appearance of the left C5 superior facet but with no adjacent fracture fragment noted.  Cervical spondylotic changes with various degrees of spinal stenosis and foraminal narrowing.  Anterior slip of C7 felt to be related to the facet joint degenerative changes.  No abnormal prevertebral soft tissue swelling.  No apical pneumothorax.  Carotid bifurcation calcifications.  IMPRESSION: No cervical spine fracture.  Please see above.  Critical Value/emergent results were called by telephone at the time of interpretation on 02/08/2012 at 11:37 p.m. to Dr. Randal Buba., who verbally acknowledged these results.   Original Report Authenticated By: Genia Del, M.D.    Ct Cervical Spine Wo Contrast  02/08/2012  *RADIOLOGY REPORT*  Clinical Data:  Fall.  CT HEAD WITHOUT CONTRAST CT CERVICAL SPINE WITHOUT CONTRAST  Technique:  Multidetector CT imaging of the head and cervical spine was performed following the standard protocol without intravenous contrast.  Multiplanar CT image reconstructions of the cervical spine were also generated.  Comparison:   None  CT HEAD  Findings: Moderate to large sized left occipital subcutaneous hematoma.  No underlying skull fracture.  Small amount of subarachnoid blood/cortical parenchymal blood posterior left frontal - parietal junction.  Small left tentorial subdural hematoma.  No CT evidence of large acute infarct.  No hydrocephalus. No intracranial mass lesion detected on this unenhanced exam.   IMPRESSION: Moderate to large sized left occipital subcutaneous hematoma.  No underlying skull fracture.  Small amount of subarachnoid blood/cortical parenchymal blood posterior left frontal - parietal junction.  Small left tentorial subdural hematoma.  CT CERVICAL SPINE  Findings: No evidence of cervical spine fracture.  Slightly blunted appearance of the left C5 superior facet but with no adjacent fracture fragment noted.  Cervical spondylotic changes with various degrees of spinal stenosis and foraminal narrowing.  Anterior slip of C7 felt to be related to the facet joint degenerative changes.  No abnormal prevertebral soft tissue swelling.  No apical pneumothorax.  Carotid bifurcation calcifications.  IMPRESSION: No cervical spine fracture.  Please see above.  Critical Value/emergent results were called by telephone at the time of interpretation on 02/08/2012 at 11:37 p.m. to Dr. Randal Buba., who verbally acknowledged these results.   Original Report Authenticated By: Genia Del, M.D.     Review of Systems  Constitutional: Negative.   Eyes: Negative.   Respiratory: Negative.   Cardiovascular: Negative.   Gastrointestinal: Negative.   Skin: Negative.   Neurological: Positive for dizziness and headaches.  Psychiatric/Behavioral: Negative.    Blood pressure 130/76, pulse 89, temperature 98.3 F (36.8 C), temperature source Oral, resp. rate 18, height 5' 7.5" (1.715 m), weight 77.111 kg (170 lb), SpO2 99.00%. Physical Exam  Constitutional: She is oriented to person, place, and time. She appears well-developed and well-nourished.  HENT:  Head: Normocephalic and atraumatic.  Eyes: Pupils are equal, round, and reactive to light.  Neck: Normal range of motion.  Cardiovascular: Normal rate.   Respiratory: Effort normal.  GI: Soft.  Genitourinary: Vagina normal.  Musculoskeletal: Normal range of motion.  Neurological: She is alert and oriented to person, place, and time. She has normal strength.  GCS eye subscore is 4. GCS verbal subscore is 5. GCS motor subscore is 6.       Awake alert oriented strength 5 out of 5 pupils are equal and reactive extraocular movements are intact no pronator drift    Assessment/Plan: 79 year female with a minor closed head injury very small amount of traumatic subarachnoid hemorrhage left parietal and tentorial subdural nonsignificant recommend a repeat CT scan 24 hours but otherwise continue medical workup for syncope and vertigo  Ryden Wainer P 02/09/2012, 1:59 AM

## 2012-02-09 NOTE — Progress Notes (Signed)
*  PRELIMINARY RESULTS* Vascular Ultrasound Carotid Duplex (Doppler) has been completed.  Preliminary findings: Bilateral:  No evidence of hemodynamically significant internal carotid artery stenosis.   Vertebral artery flow is antegrade.      Landry Mellow, RDMS, RVT 02/09/2012, 9:37 AM

## 2012-02-09 NOTE — Progress Notes (Signed)
[facility] Progress Note [place] TEAM 1 - Stepdown/ICU TEAM   Nancy Meadows CBJ:[id number] DOB: [birth year] DOA: 02/08/2012 PCP: No primary provider on file.  Brief narrative: 79 yo female overall healthy was cleaning her bbq pit when she was bending over and got dizzy and passed out, hitting her head. She had experienced a dizzy spell earlier in the day and took a meclizine, but reported that the med only made her feel more dizzy and confused.  She had been in her normal state of health. No fevers. No headache prior to syncopal event. No cp. No vision changes. She was not having any neurological deficits on presentation, denied any weakness. Denied headache or vision changes. No n/v. No le edema or swelling. Took 2 baby asa 1/25 - takes this inconsistently/not daily.  Assessment/Plan:  Closed head injury w/ acute SDH and SAH F/u CT scan in AM - clinically stable  Syncope (postural) Possibly realted to meclizine use - TTE pending - carotids normal  HLD  Hypothyroid Check TSH   CAD? NORMAL stress nuclear study March 2012 - EKG w/o acute findings, but does reveal 1st degree AVB - follow on tele - Followed by [place] as outpt  Remote hx of gross hematuria Followed by Urology as outpt  Code Status: FULL Family Communication:  Disposition Plan: transfer to neuro tele bed  Consultants: Neurosurgery  Procedures: Carotid dopplers - 1/26 - no stenosis noted - antegrade vertebral artery flow  Antibiotics: none  DVT prophylaxis: SCDs only  HPI/Subjective: F/U visit completed   Objective: Blood pressure 110/64, pulse 71, temperature 98.1 F (36.7 C), temperature source Oral, resp. rate 20, height _0  (1.727 m), weight 75.5 kg (166 lb 7.2 oz), SpO2 99.00%.  Intake/Output Summary (Last 24 hours) at 02/09/12 1010 Last data filed at 02/09/12 0700  Gross per 24 hour  Intake      0 ml  Output    650 ml  Net   -650 ml     Exam: F/U exam completed  Data  Reviewed: Basic Metabolic Panel:  Lab 16/07/37 0020  NA 140  K 4.1  CL 103  CO2 27  GLUCOSE 109*  BUN 20  CREATININE 1.10  CALCIUM 10.2  MG --  PHOS --   Liver Function Tests:  Lab 02/09/12 0020  AST 21  ALT 16  ALKPHOS 84  BILITOT 0.3  PROT 8.0  ALBUMIN 3.8   CBC:  Lab 02/09/12 0020  WBC 8.8  NEUTROABS 5.3  HGB 12.2  HCT 36.6  MCV 93.1  PLT 160   Cardiac Enzymes:  Lab 02/09/12 0845 02/09/12 0020  CKTOTAL -- --  CKMB -- --  CKMBINDEX -- --  TROPONINI <0.30 <0.30    Recent Results (from the past 240 hour(s))  MRSA PCR SCREENING     Status: Normal   Collection Time   02/09/12  1:49 AM      Component Value Range Status Comment   MRSA by PCR NEGATIVE  NEGATIVE Final      Studies:  Recent x-ray studies have been reviewed in detail by the Attending Physician  Scheduled Meds:  Reviewed in detail by the Attending Physician   Cherene Altes, MD [facility] Office  [phone] Pager [phone]  On-Call/Text Page:      [email or web address]      password [email or web address]  If 7PM-7AM, please contact night-coverage [email or web address] Password [email or web address] 02/09/2012, 10:10 AM   LOS: 1 day

## 2012-02-10 ENCOUNTER — Inpatient Hospital Stay (HOSPITAL_COMMUNITY): Payer: Medicare Other

## 2012-02-10 DIAGNOSIS — I059 Rheumatic mitral valve disease, unspecified: Secondary | ICD-10-CM

## 2012-02-10 LAB — COMPREHENSIVE METABOLIC PANEL
ALT: 13 U/L (ref 0–35)
Alkaline Phosphatase: 72 U/L (ref 39–117)
BUN: 19 mg/dL (ref 6–23)
Chloride: 104 mEq/L (ref 96–112)
GFR calc Af Amer: 59 mL/min — ABNORMAL LOW (ref >90)
Glucose, Bld: 92 mg/dL (ref 70–99)
Potassium: 4.2 mEq/L (ref 3.5–5.1)
Sodium: 139 mEq/L (ref 135–145)
Total Bilirubin: 0.5 mg/dL (ref 0.3–1.2)
Total Protein: 6.9 g/dL (ref 6.0–8.3)

## 2012-02-10 NOTE — Progress Notes (Signed)
Subjective: Patient reports She feels great no headaches no nausea no vomiting no dizziness  Objective: Vital signs in last 24 hours: Temp:  [97.9 F (36.6 C)-98.8 F (37.1 C)] 98.1 F (36.7 C) (01/27 0602) Pulse Rate:  [70-95] 95  (01/27 0602) Resp:  [18-23] 20  (01/27 0602) BP: (93-137)/(55-91) 93/55 mmHg (01/27 0602) SpO2:  [97 %-100 %] 98 % (01/27 0602)  Intake/Output from previous day: 01/26 0701 - 01/27 0700 In: -  Out: 800 [Urine:800] Intake/Output this shift: Total I/O In: 95 [Other:95] Out: -   Awake alert oriented strength 5 out of 5  Lab Results:  [facility] 02/09/12 0020  WBC 8.8  HGB 12.2  HCT 36.6  PLT 160   BMET  Basename 02/10/12 0655 02/09/12 0020  NA 139 140  K 4.2 4.1  CL 104 103  CO2 27 27  GLUCOSE 92 109*  BUN 19 20  CREATININE 1.04 1.10  CALCIUM 9.3 10.2    Studies/Results: Dg Chest 2 View  02/09/2012  *RADIOLOGY REPORT*  Clinical Data: Syncope; dizziness.  Concern for chest injury.  CHEST - 2 VIEW  Comparison: Chest radiograph performed 12/29/2008, and CT of the chest performed 12/24/2010  Findings: The lungs are mildly hypoexpanded; mild bibasilar atelectasis is noted.  There is no evidence of pleural effusion or pneumothorax.  The heart is normal in size; the mediastinal contour is within normal limits.  No acute osseous abnormalities are seen.  IMPRESSION: Lungs mildly hypoexpanded, with mild bibasilar atelectasis.   Original Report Authenticated By: Santa Lighter, M.D.    Ct Head Wo Contrast  02/10/2012  *RADIOLOGY REPORT*  Clinical Data: Follow-up closed head injury with intracranial hemorrhage  CT HEAD WITHOUT CONTRAST  Technique:  Contiguous axial images were obtained from the base of the skull through the vertex without contrast.  Comparison: 02/08/2011  Findings: A 4-5 mm focus of hemorrhage with in a sulcus at the vertex on the left has not increased.  No more generalized subarachnoid hemorrhage is noted.  Minimal hyperdensity along  the left falx remains evident to suggest the possibility of a small left tentorial subdural hematoma.  I think there could also be some mild hemorrhage/hemorrhagic contusion in the left posterior temporal lobe adjacent to that.  This has not progressed. Ventricular size is stable.  No mass effect or shift.  No skull fracture.  No fluid in the sinuses.  Scalp hematoma is smaller.  IMPRESSION: No increase in intracranial hemorrhage and no development of a worrisome complication.  Small amount of blood in a sulcus at the vertex on the left has not increased.  Small amount of blood, subdural along the left tentorium has not increased.  I do think there may be some mild contusion of the left posterior temporal lobe, not increased.   Original Report Authenticated By: Nelson Chimes, M.D.    Ct Head Wo Contrast  02/08/2012  *RADIOLOGY REPORT*  Clinical Data:  Fall.  CT HEAD WITHOUT CONTRAST CT CERVICAL SPINE WITHOUT CONTRAST  Technique:  Multidetector CT imaging of the head and cervical spine was performed following the standard protocol without intravenous contrast.  Multiplanar CT image reconstructions of the cervical spine were also generated.  Comparison:   None  CT HEAD  Findings: Moderate to large sized left occipital subcutaneous hematoma.  No underlying skull fracture.  Small amount of subarachnoid blood/cortical parenchymal blood posterior left frontal - parietal junction.  Small left tentorial subdural hematoma.  No CT evidence of large acute infarct.  No hydrocephalus. No  intracranial mass lesion detected on this unenhanced exam.  IMPRESSION: Moderate to large sized left occipital subcutaneous hematoma.  No underlying skull fracture.  Small amount of subarachnoid blood/cortical parenchymal blood posterior left frontal - parietal junction.  Small left tentorial subdural hematoma.  CT CERVICAL SPINE  Findings: No evidence of cervical spine fracture.  Slightly blunted appearance of the left C5 superior facet but  with no adjacent fracture fragment noted.  Cervical spondylotic changes with various degrees of spinal stenosis and foraminal narrowing.  Anterior slip of C7 felt to be related to the facet joint degenerative changes.  No abnormal prevertebral soft tissue swelling.  No apical pneumothorax.  Carotid bifurcation calcifications.  IMPRESSION: No cervical spine fracture.  Please see above.  Critical Value/emergent results were called by telephone at the time of interpretation on 02/08/2012 at 11:37 p.m. to Dr. Randal Buba., who verbally acknowledged these results.   Original Report Authenticated By: Genia Del, M.D.    Ct Cervical Spine Wo Contrast  02/08/2012  *RADIOLOGY REPORT*  Clinical Data:  Fall.  CT HEAD WITHOUT CONTRAST CT CERVICAL SPINE WITHOUT CONTRAST  Technique:  Multidetector CT imaging of the head and cervical spine was performed following the standard protocol without intravenous contrast.  Multiplanar CT image reconstructions of the cervical spine were also generated.  Comparison:   None  CT HEAD  Findings: Moderate to large sized left occipital subcutaneous hematoma.  No underlying skull fracture.  Small amount of subarachnoid blood/cortical parenchymal blood posterior left frontal - parietal junction.  Small left tentorial subdural hematoma.  No CT evidence of large acute infarct.  No hydrocephalus. No intracranial mass lesion detected on this unenhanced exam.  IMPRESSION: Moderate to large sized left occipital subcutaneous hematoma.  No underlying skull fracture.  Small amount of subarachnoid blood/cortical parenchymal blood posterior left frontal - parietal junction.  Small left tentorial subdural hematoma.  CT CERVICAL SPINE  Findings: No evidence of cervical spine fracture.  Slightly blunted appearance of the left C5 superior facet but with no adjacent fracture fragment noted.  Cervical spondylotic changes with various degrees of spinal stenosis and foraminal narrowing.  Anterior slip of C7 felt to  be related to the facet joint degenerative changes.  No abnormal prevertebral soft tissue swelling.  No apical pneumothorax.  Carotid bifurcation calcifications.  IMPRESSION: No cervical spine fracture.  Please see above.  Critical Value/emergent results were called by telephone at the time of interpretation on 02/08/2012 at 11:37 p.m. to Dr. Randal Buba., who verbally acknowledged these results.   Original Report Authenticated By: Genia Del, M.D.     Assessment/Plan: 79 year old female admitted with close injury and small amount of traumatic subarachnoid and tentorial subdural symptoms are completely resolved followup CAT scans have shown significant interval improvement she is stable enough my perspective to be discharged home when cleared from medical perspective. I do not necessarily need to follow patient up as an outpatient unless she has a recurrence of symptoms  LOS: 2 days     Jahzion Brogden P 02/10/2012, 1:13 PM

## 2012-02-10 NOTE — Evaluation (Signed)
Occupational Therapy Evaluation Patient Details Name: Nancy Meadows MRN: [id number] DOB: 12/03/1932 Today's Date: 02/10/2012 Time: 0903-0149 OT Time Calculation (min): 24 min  OT Assessment / Plan / Recommendation Clinical Impression  Pt admitted after fall due to dizziness in which she hit her head.  Dizziness has resolved.  Pt is performing mobililty and ADL independently.  No further OT needs.    OT Assessment  Patient does not need any further OT services    Follow Up Recommendations  No OT follow up    Barriers to Discharge      Equipment Recommendations       Recommendations for Other Services    Frequency       Precautions / Restrictions Precautions Precautions: None   Pertinent Vitals/Pain Soreness on back of head from fall, repositioned    ADL  Eating/Feeding: Independent Where Assessed - Eating/Feeding: Bed level Grooming: Independent Where Assessed - Grooming: Unsupported standing Upper Body Bathing: Independent Where Assessed - Upper Body Bathing: Unsupported standing Lower Body Bathing: Independent Where Assessed - Lower Body Bathing: Supported standing Upper Body Dressing: Independent Where Assessed - Upper Body Dressing: Unsupported sitting Lower Body Dressing: Independent Where Assessed - Lower Body Dressing: Unsupported sitting;Supported sit to stand Tub/Shower Transfer: Modified independent Tub/Shower Transfer Method: Ambulating Transfers/Ambulation Related to ADLs: Independent    OT Diagnosis:    OT Problem List:   OT Treatment Interventions:     OT Goals    Visit Information  Last OT Received On: 02/10/12 PT/OT Co-Evaluation/Treatment: Yes    Subjective Data  Subjective: "My husband likes to eat steak every Sunday." Patient Stated Goal: Return home.   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 Communication: No difficulties Dominant Hand: Right         Vision/Perception     Cognition  Overall Cognitive Status: Appears within functional limits for tasks assessed/performed Arousal/Alertness: Awake/alert Orientation Level: Appears intact for tasks assessed;Oriented X4 / Intact Behavior During Session: [facility] for tasks performed    Extremity/Trunk Assessment Right Upper Extremity Assessment RUE ROM/Strength/Tone: [facility] for tasks assessed Left Upper Extremity Assessment LUE ROM/Strength/Tone: WFL for tasks assessed Right Lower Extremity Assessment RLE ROM/Strength/Tone: [facility] for tasks assessed Left Lower Extremity Assessment LLE ROM/Strength/Tone: [facility] for tasks assessed Trunk Assessment Trunk Assessment: Normal     Mobility Bed Mobility Bed Mobility: Supine to Sit;Sitting - Scoot to Edge of Bed Supine to Sit: 6: Modified independent (Device/Increase time) Sitting - Scoot to Edge of Bed: 6: Modified independent (Device/Increase time) Details for Bed Mobility Assistance: mod I for increased time Transfers Sit to Stand: 6: Modified independent (Device/Increase time);From bed Stand to Sit: 6: Modified independent (Device/Increase time);To chair/3-in-1;With armrests Details for Transfer Assistance: mod I for increased time     Shoulder Instructions     Exercise     Balance     End of Session OT - End of Session Activity Tolerance: Patient tolerated treatment well Patient left: in chair;with call bell/phone within reach  GO     Malka So 02/10/2012, 12:49 PM [phone]

## 2012-02-10 NOTE — Progress Notes (Signed)
[facility] PROGRESS NOTE  Aija Scarfo [place] QQP:[id number] DOB: 01/23/1932 DOA: 02/08/2012 PCP: No primary provider on file.  Brief narrative:  79 yo female overall healthy was cleaning her bbq pit when she was bending over and got dizzy and passed out, hitting her head. She had experienced a dizzy spell earlier in the day and took a meclizine, but reported that the med only made her feel more dizzy and confused. She had been in her normal state of health. No fevers. No headache prior to syncopal event. No cp. No vision changes. She was not having any neurological deficits on presentation, denied any weakness. Denied headache or vision changes. No n/v. No le edema or swelling. Took 2 baby asa 1/25 - takes this inconsistently/not daily.  Assessment/Plan: Closed head injury w/ acute SDH and SAH  Repeat head CT on 02/10/2012 showed no increase in intracranial hemorrhage and no development of worrisome complication.  Clinical stable.  Continue to hold aspirin.  Syncope (postural)  Possibly realted to meclizine use? TTE pending. Carotids normal.  Continue physical therapy.  Hyperlipidemia Stable.  Hypothyroid  Continue levothyroxine.  TSH normal.  Coronary artery disease Normal stress nuclear study March 2012. EKG w/o acute findings, but does reveal 1st degree AVB. No events on telemetry. Followed by Velora Heckler as outpatient.  Remote hx of gross hematuria  Followed by Urology as outpt  Code Status:  Full code. Family Communication: No family at bedside. Disposition Plan:  Pending.  Consultants:  Dr. Saintclair Halsted, neurosurgery.  Procedures: Carotid Dopplers 02/09/2012 Bilateral: No evidence of hemodynamically significant internal carotid artery stenosis. Vertebral artery flow is antegrade.   Antibiotics:  None.  HPI/Subjective:  No specific concerns.  Wondering if she can go home  Objective: Filed Vitals:   02/09/12 2200 02/10/12 0600 02/10/12 0601 02/10/12 0602  BP: 137/66 100/61  106/62 93/55  Pulse: 85 76 78 95  Temp: 97.9 F (36.6 C) 98.1 F (36.7 C) 98.1 F (36.7 C) 98.1 F (36.7 C)  TempSrc: Oral Oral Oral Oral  Resp: _0 Height: _1  (1.702 m)     Weight:      SpO2: 100% 99% 98% 98%    Intake/Output Summary (Last 24 hours) at 02/10/12 1128 Last data filed at 02/10/12 0805  Gross per 24 hour  Intake     95 ml  Output    600 ml  Net   -505 ml   Filed Weights   02/08/12 2214 02/09/12 0110  Weight: 77.111 kg (170 lb) 75.5 kg (166 lb 7.2 oz)    Exam: Physical Exam: General: Awake, Oriented, No acute distress. HEENT: EOMI. Neck: Supple CV: S1 and S2 Lungs: Clear to ascultation bilaterally Abdomen: Soft, Nontender, Nondistended, +bowel sounds. Ext: Good pulses. Trace edema.  Data Reviewed: Basic Metabolic Panel:  Lab 71/24/58 0655 02/09/12 0020  NA 139 140  K 4.2 4.1  CL 104 103  CO2 27 27  GLUCOSE 92 109*  BUN 19 20  CREATININE 1.04 1.10  CALCIUM 9.3 10.2  MG -- --  PHOS -- --   Liver Function Tests:  Lab 02/10/12 0655 02/09/12 0020  AST 17 21  ALT 13 16  ALKPHOS 72 84  BILITOT 0.5 0.3  PROT 6.9 8.0  ALBUMIN 3.2* 3.8   No results found for this basename: LIPASE:5,AMYLASE:5 in the last 168 hours No results found for this basename: AMMONIA:5 in the last 168 hours CBC:  Lab 02/09/12 0020  WBC 8.8  NEUTROABS 5.3  HGB  12.2  HCT 36.6  MCV 93.1  PLT 160   Cardiac Enzymes:  Lab 02/10/12 0655 02/09/12 0845 02/09/12 0020  CKTOTAL -- -- --  CKMB -- -- --  CKMBINDEX -- -- --  TROPONINI <0.30 <0.30 <0.30   BNP (last 3 results) No results found for this basename: PROBNP:3 in the last 8760 hours CBG: No results found for this basename: GLUCAP:5 in the last 168 hours  Recent Results (from the past 240 hour(s))  MRSA PCR SCREENING     Status: Normal   Collection Time   02/09/12  1:49 AM      Component Value Range Status Comment   MRSA by PCR NEGATIVE  NEGATIVE Final      Studies: Dg Chest 2  View  02/09/2012  *RADIOLOGY REPORT*  Clinical Data: Syncope; dizziness.  Concern for chest injury.  CHEST - 2 VIEW  Comparison: Chest radiograph performed 12/29/2008, and CT of the chest performed 12/24/2010  Findings: The lungs are mildly hypoexpanded; mild bibasilar atelectasis is noted.  There is no evidence of pleural effusion or pneumothorax.  The heart is normal in size; the mediastinal contour is within normal limits.  No acute osseous abnormalities are seen.  IMPRESSION: Lungs mildly hypoexpanded, with mild bibasilar atelectasis.   Original Report Authenticated By: Santa Lighter, M.D.    Ct Head Wo Contrast  02/10/2012  *RADIOLOGY REPORT*  Clinical Data: Follow-up closed head injury with intracranial hemorrhage  CT HEAD WITHOUT CONTRAST  Technique:  Contiguous axial images were obtained from the base of the skull through the vertex without contrast.  Comparison: 02/08/2011  Findings: A 4-5 mm focus of hemorrhage with in a sulcus at the vertex on the left has not increased.  No more generalized subarachnoid hemorrhage is noted.  Minimal hyperdensity along the left falx remains evident to suggest the possibility of a small left tentorial subdural hematoma.  I think there could also be some mild hemorrhage/hemorrhagic contusion in the left posterior temporal lobe adjacent to that.  This has not progressed. Ventricular size is stable.  No mass effect or shift.  No skull fracture.  No fluid in the sinuses.  Scalp hematoma is smaller.  IMPRESSION: No increase in intracranial hemorrhage and no development of a worrisome complication.  Small amount of blood in a sulcus at the vertex on the left has not increased.  Small amount of blood, subdural along the left tentorium has not increased.  I do think there may be some mild contusion of the left posterior temporal lobe, not increased.   Original Report Authenticated By: Nelson Chimes, M.D.    Ct Head Wo Contrast  02/08/2012  *RADIOLOGY REPORT*  Clinical Data:   Fall.  CT HEAD WITHOUT CONTRAST CT CERVICAL SPINE WITHOUT CONTRAST  Technique:  Multidetector CT imaging of the head and cervical spine was performed following the standard protocol without intravenous contrast.  Multiplanar CT image reconstructions of the cervical spine were also generated.  Comparison:   None  CT HEAD  Findings: Moderate to large sized left occipital subcutaneous hematoma.  No underlying skull fracture.  Small amount of subarachnoid blood/cortical parenchymal blood posterior left frontal - parietal junction.  Small left tentorial subdural hematoma.  No CT evidence of large acute infarct.  No hydrocephalus. No intracranial mass lesion detected on this unenhanced exam.  IMPRESSION: Moderate to large sized left occipital subcutaneous hematoma.  No underlying skull fracture.  Small amount of subarachnoid blood/cortical parenchymal blood posterior left frontal - parietal junction.  Small left  tentorial subdural hematoma.  CT CERVICAL SPINE  Findings: No evidence of cervical spine fracture.  Slightly blunted appearance of the left C5 superior facet but with no adjacent fracture fragment noted.  Cervical spondylotic changes with various degrees of spinal stenosis and foraminal narrowing.  Anterior slip of C7 felt to be related to the facet joint degenerative changes.  No abnormal prevertebral soft tissue swelling.  No apical pneumothorax.  Carotid bifurcation calcifications.  IMPRESSION: No cervical spine fracture.  Please see above.  Critical Value/emergent results were called by telephone at the time of interpretation on 02/08/2012 at 11:37 p.m. to Dr. Randal Buba., who verbally acknowledged these results.   Original Report Authenticated By: Genia Del, M.D.    Ct Cervical Spine Wo Contrast  02/08/2012  *RADIOLOGY REPORT*  Clinical Data:  Fall.  CT HEAD WITHOUT CONTRAST CT CERVICAL SPINE WITHOUT CONTRAST  Technique:  Multidetector CT imaging of the head and cervical spine was performed following the  standard protocol without intravenous contrast.  Multiplanar CT image reconstructions of the cervical spine were also generated.  Comparison:   None  CT HEAD  Findings: Moderate to large sized left occipital subcutaneous hematoma.  No underlying skull fracture.  Small amount of subarachnoid blood/cortical parenchymal blood posterior left frontal - parietal junction.  Small left tentorial subdural hematoma.  No CT evidence of large acute infarct.  No hydrocephalus. No intracranial mass lesion detected on this unenhanced exam.  IMPRESSION: Moderate to large sized left occipital subcutaneous hematoma.  No underlying skull fracture.  Small amount of subarachnoid blood/cortical parenchymal blood posterior left frontal - parietal junction.  Small left tentorial subdural hematoma.  CT CERVICAL SPINE  Findings: No evidence of cervical spine fracture.  Slightly blunted appearance of the left C5 superior facet but with no adjacent fracture fragment noted.  Cervical spondylotic changes with various degrees of spinal stenosis and foraminal narrowing.  Anterior slip of C7 felt to be related to the facet joint degenerative changes.  No abnormal prevertebral soft tissue swelling.  No apical pneumothorax.  Carotid bifurcation calcifications.  IMPRESSION: No cervical spine fracture.  Please see above.  Critical Value/emergent results were called by telephone at the time of interpretation on 02/08/2012 at 11:37 p.m. to Dr. Randal Buba., who verbally acknowledged these results.   Original Report Authenticated By: Genia Del, M.D.     Scheduled Meds:   . cycloSPORINE  1 drop Both Eyes Daily  . levothyroxine  88 mcg Oral QAC breakfast  . sodium chloride  3 mL Intravenous Q12H   Continuous Infusions:   Principal Problem:  *Acute head trauma Active Problems:  Syncope  Subdural hematoma, acute  SAH (subarachnoid hemorrhage)    Cleotilde Spadaccini A, MD  [facility] Pager [phone]. If 7PM-7AM, please contact  night-coverage at [email or web address], password [facility] 02/10/2012, 11:28 AM  LOS: 2 days

## 2012-02-10 NOTE — Evaluation (Signed)
Physical Therapy Evaluation Patient Details Name: Nancy Meadows MRN: [id number] DOB: 12-08-1932 Today's Date: 02/10/2012 Time: 1212-1236 PT Time Calculation (min): 24 min  PT Assessment / Plan / Recommendation Clinical Impression  Pt is a 79 y.o. female s/p dizziness with fall causing head trauma.  Patient is at baseline level of mobility and is feeling much better. No dizziness reported since fall. Patient educated on slow controlled transitions to avoid orthostatic event. No further Acute PT needs at this time. Rec d/c home     PT Assessment  Patent does not need any further PT services                      Precautions / Restrictions Precautions Precautions: None   Pertinent Vitals/Pain Pt reports no pain or dizziness       Mobility  Bed Mobility Bed Mobility: Supine to Sit;Sitting - Scoot to Edge of Bed Supine to Sit: 6: Modified independent (Device/Increase time) Sitting - Scoot to Edge of Bed: 6: Modified independent (Device/Increase time) Details for Bed Mobility Assistance: mod I for increased time Transfers Transfers: Sit to Stand;Stand to Sit Sit to Stand: 6: Modified independent (Device/Increase time);From bed Stand to Sit: 6: Modified independent (Device/Increase time);To chair/3-in-1;With armrests Details for Transfer Assistance: mod I for increased time Ambulation/Gait Ambulation/Gait Assistance: 7: Independent Ambulation Distance (Feet): 100 Feet Assistive device: None Ambulation/Gait Assistance Details: Pt at baseline Gait Pattern: Within Functional Limits Gait velocity: WFL Stairs: Yes Stairs Assistance: 6: Modified independent (Device/Increase time) Stair Management Technique: One rail Right Number of Stairs: 5  (two trials)        Visit Information  Last PT Received On: 02/10/12 Assistance Needed: +1 PT/OT Co-Evaluation/Treatment: Yes    Subjective Data  Subjective: My husband wanted a steak so I told him this wouldn't have happened if he  would have eaten something else Patient Stated Goal: to go home   Prior [place] Lives With: Spouse Available Help at Discharge: Family;Available 24 hours/day Type of Home: House Home Access: Ramped entrance Home Layout: One level (2 steps from living room to kitchen with rail) Bathroom Shower/Tub: Tub/shower unit;Curtain Bathroom Toilet: Handicapped height Home Adaptive Equipment: Heritage manager - four wheeled Prior Function Level of Independence: Independent Able to Take Stairs?: Yes Driving: Yes 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 Communication: No difficulties Dominant Hand: Right    Cognition  Overall Cognitive Status: Appears within functional limits for tasks assessed/performed Arousal/Alertness: Awake/alert Orientation Level: Appears intact for tasks assessed;Oriented X4 / Intact Behavior During Session: [facility] for tasks performed    Extremity/Trunk Assessment Right Upper Extremity Assessment RUE ROM/Strength/Tone: [facility] for tasks assessed Left Upper Extremity Assessment LUE ROM/Strength/Tone: [facility] for tasks assessed Right Lower Extremity Assessment RLE ROM/Strength/Tone: [facility] for tasks assessed Left Lower Extremity Assessment LLE ROM/Strength/Tone: WFL for tasks assessed Trunk Assessment Trunk Assessment: Normal      End of Session PT - End of Session Equipment Utilized During Treatment: Gait belt Activity Tolerance: Patient tolerated treatment well Patient left: in chair;with call bell/phone within reach Nurse Communication: Mobility status  GP     Duncan Dull 02/10/2012, 1:04 PM  Alben Deeds, [place] DPT  [phone]

## 2012-02-10 NOTE — Progress Notes (Signed)
Orthostatic VS have been taken. Please refer to doc flowsheets for them.

## 2012-02-10 NOTE — Progress Notes (Signed)
Echocardiogram 2D Echocardiogram has been performed.  Deirdra Heumann 02/10/2012, 2:26 PM

## 2012-02-11 NOTE — Progress Notes (Signed)
[facility] PROGRESS NOTE  Sansa Alkema Frew WTG:[id number] DOB: [birth year] DOA: 02/08/2012 PCP: [name], MD  Brief narrative:  79 yo female overall healthy was cleaning her bbq pit when she was bending over and got dizzy and passed out, hitting her head. She had experienced a dizzy spell earlier in the day and took a meclizine, but reported that the med only made her feel more dizzy and confused. She had been in her normal state of health. No fevers. No headache prior to syncopal event. No cp. No vision changes. She was not having any neurological deficits on presentation, denied any weakness. Denied headache or vision changes. No n/v. No le edema or swelling. Took 2 baby asa 1/25 - takes this inconsistently/not daily.  Assessment/Plan: Closed head injury w/ acute SDH and SAH  Repeat head CT on 02/10/2012 showed no increase in intracranial hemorrhage and no development of worrisome complication.  Clinical stable.  Continue to hold aspirin, patient was instructed to discuss with her primary care physician about when to resume her aspirin.  Syncope (postural)  Possibly related to meclizine use? TTE unremarkable, results as indicated below. Carotids normal.  No physical therapy needs.  Hyperlipidemia Stable.  Hypothyroid  Continue levothyroxine.  TSH normal.  Coronary artery disease? Patient does not endorse ever having problems with her heart or heart attacks.  Normal stress nuclear study March 2012. EKG w/o acute findings, but does reveal 1st degree AVB. No events on telemetry. Followed by Velora Heckler as outpatient.  Continue to hold aspirin, discussed with primary care physician about when to resume aspirin.  Remote hx of gross hematuria  Followed by Urology as outpt  Code Status:  Full code. Family Communication: No family at bedside. Disposition Plan:  Discharge patient today.  Consultants:  Dr. Saintclair Halsted, neurosurgery.  Procedures: Carotid Dopplers 02/09/2012 Bilateral:  No evidence of hemodynamically significant internal carotid artery stenosis. Vertebral artery flow is antegrade.   2-D echocardiogram on 02/10/2012 Study Conclusions - Left ventricle: The cavity size was normal. Wall thickness was normal. Systolic function was normal. The estimated ejection fraction was in the range of 55% to 65%. - Mitral valve: Mild regurgitation.  Antibiotics:  None.  HPI/Subjective: Wondering if she can go home. No specific concerns.  Objective: Filed Vitals:   02/10/12 1400 02/10/12 2140 02/11/12 0615 02/11/12 1003  BP: 115/57 106/59 124/72 104/55  Pulse: 79 65 67 73  Temp: 98.1 F (36.7 C) 98.4 F (36.9 C) 98.1 F (36.7 C) 97.8 F (36.6 C)  TempSrc: Oral Oral Oral Oral  Resp: _0 Height:      Weight:      SpO2: 100% 100% 100% 100%    Intake/Output Summary (Last 24 hours) at 02/11/12 1152 Last data filed at 02/10/12 1300  Gross per 24 hour  Intake    360 ml  Output      0 ml  Net    360 ml   Filed Weights   02/08/12 2214 02/09/12 0110  Weight: 77.111 kg (170 lb) 75.5 kg (166 lb 7.2 oz)    Exam: Physical Exam: General: Awake, Oriented, No acute distress. HEENT: EOMI. Neck: Supple CV: S1 and S2 Lungs: Clear to ascultation bilaterally Abdomen: Soft, Nontender, Nondistended, +bowel sounds. Ext: Good pulses. Trace edema.  Data Reviewed: Basic Metabolic Panel:  Lab 92/49/32 0655 02/09/12 0020  NA 139 140  K 4.2 4.1  CL 104 103  CO2 27 27  GLUCOSE 92 109*  BUN 19 20  CREATININE 1.04  1.10  CALCIUM 9.3 10.2  MG -- --  PHOS -- --   Liver Function Tests:  Lab 02/10/12 0655 02/09/12 0020  AST 17 21  ALT 13 16  ALKPHOS 72 84  BILITOT 0.5 0.3  PROT 6.9 8.0  ALBUMIN 3.2* 3.8   No results found for this basename: LIPASE:5,AMYLASE:5 in the last 168 hours No results found for this basename: AMMONIA:5 in the last 168 hours CBC:  Lab 02/09/12 0020  WBC 8.8  NEUTROABS 5.3  HGB 12.2  HCT 36.6  MCV 93.1  PLT 160    Cardiac Enzymes:  Lab 02/10/12 0655 02/09/12 0845 02/09/12 0020  CKTOTAL -- -- --  CKMB -- -- --  CKMBINDEX -- -- --  TROPONINI <0.30 <0.30 <0.30   BNP (last 3 results) No results found for this basename: PROBNP:3 in the last 8760 hours CBG: No results found for this basename: GLUCAP:5 in the last 168 hours  Recent Results (from the past 240 hour(s))  MRSA PCR SCREENING     Status: Normal   Collection Time   02/09/12  1:49 AM      Component Value Range Status Comment   MRSA by PCR NEGATIVE  NEGATIVE Final      Studies: Ct Head Wo Contrast  02/10/2012  *RADIOLOGY REPORT*  Clinical Data: Follow-up closed head injury with intracranial hemorrhage  CT HEAD WITHOUT CONTRAST  Technique:  Contiguous axial images were obtained from the base of the skull through the vertex without contrast.  Comparison: 02/08/2011  Findings: A 4-5 mm focus of hemorrhage with in a sulcus at the vertex on the left has not increased.  No more generalized subarachnoid hemorrhage is noted.  Minimal hyperdensity along the left falx remains evident to suggest the possibility of a small left tentorial subdural hematoma.  I think there could also be some mild hemorrhage/hemorrhagic contusion in the left posterior temporal lobe adjacent to that.  This has not progressed. Ventricular size is stable.  No mass effect or shift.  No skull fracture.  No fluid in the sinuses.  Scalp hematoma is smaller.  IMPRESSION: No increase in intracranial hemorrhage and no development of a worrisome complication.  Small amount of blood in a sulcus at the vertex on the left has not increased.  Small amount of blood, subdural along the left tentorium has not increased.  I do think there may be some mild contusion of the left posterior temporal lobe, not increased.   Original Report Authenticated By: Nelson Chimes, M.D.     Scheduled Meds:    . cycloSPORINE  1 drop Both Eyes Daily  . levothyroxine  88 mcg Oral QAC breakfast  . sodium chloride   3 mL Intravenous Q12H   Continuous Infusions:   Principal Problem:  *Acute head trauma Active Problems:  Syncope  Subdural hematoma, acute  SAH (subarachnoid hemorrhage)    Anushka Hartinger A, MD  [facility] Pager [phone]. If 7PM-7AM, please contact night-coverage at [email or web address], password [facility] 02/11/2012, 11:52 AM  LOS: 3 days

## 2012-02-11 NOTE — Care Management Note (Signed)
Page 1 of 1   02/11/2012     3:03:36 PM   CARE MANAGEMENT NOTE 02/11/2012  Patient:  Nancy Meadows, Nancy Meadows   Account Number:  [id number]  Date Initiated:  02/10/2012  Documentation initiated by:  Fuller Plan  Subjective/Objective Assessment:   Admitted with SDH, SAH     Action/Plan:   PT/OT evals-no follow up needed   Anticipated DC Date:  02/13/2012   Anticipated DC Plan:  [place]  CM consult      Choice offered to / List presented to:             Status of service:  Completed, signed off Medicare Important Message given?   (If response is "NO", the following Medicare IM given date fields will be blank) Date Medicare IM given:   Date Additional Medicare IM given:    Discharge Disposition:  HOME/SELF CARE  Per UR Regulation:  Reviewed for med. necessity/level of care/duration of stay  If discussed at [place] of Stay Meetings, dates discussed:    Comments:

## 2012-02-11 NOTE — Progress Notes (Signed)
Pt given discharge instructions, no questions voiced. Discharged home.

## 2012-02-11 NOTE — Discharge Summary (Signed)
Physician Discharge Summary  Nancy Meadows QIO:[id number] DOB: 12-22-1932 DOA: 02/08/2012  PCP: [name], MD  Admit date: 02/08/2012 Discharge date: 02/11/2012  Time spent: 25 minutes  Recommendations for Outpatient Follow-up:  1. Followup with primary care physician in 1 week.  Discharge Diagnoses:  Principal Problem:  *Acute head trauma Active Problems:  Syncope  Subdural hematoma, acute  SAH (subarachnoid hemorrhage)   Discharge Condition: Stable  Diet recommendation: Heart healthy diet  Filed Weights   02/08/12 2214 02/09/12 0110  Weight: 77.111 kg (170 lb) 75.5 kg (166 lb 7.2 oz)    History of present illness:  79 yo female overall healthy was cleaning her bbq pit when she was bending over and got dizzy and passed out, hitting her head. She had experienced a dizzy spell earlier in the day and took a meclizine, but reported that the med only made her feel more dizzy and confused. She had been in her normal state of health. No fevers. No headache prior to syncopal event. No cp. No vision changes. She was not having any neurological deficits on presentation, denied any weakness. Denied headache or vision changes. No n/v. No le edema or swelling. Took 2 baby asa 1/25 - takes this inconsistently/not daily.  Hospital Course:  Closed head injury w/ acute SDH and SAH  Repeat head CT on 02/10/2012 showed no increase in intracranial hemorrhage and no development of worrisome complication.  Clinical stable.  Aspirin was discontinued.  Patient was instructed to discuss with her primary care physician about when to resume her aspirin.  Syncope (postural)  Possibly related to meclizine use? TTE unremarkable, results as indicated below. Carotids normal.  No physical therapy needs.  Hyperlipidemia Stable.  Hypothyroid  Continue levothyroxine.  TSH normal.  Coronary artery disease? Patient does not endorse ever having problems with her heart or heart attacks.  Normal stress  nuclear study March 2012. EKG w/o acute findings, but does reveal 1st degree AVB. No events on telemetry. Followed by Velora Heckler as outpatient.  Continue to hold aspirin, discussed with primary care physician about when to resume aspirin.  Remote hx of gross hematuria  Followed by Urology as outpt  Consultants:  Dr. Saintclair Halsted, neurosurgery.  Procedures: Carotid Dopplers 02/09/2012 Bilateral: No evidence of hemodynamically significant internal carotid artery stenosis. Vertebral artery flow is antegrade.   2-D echocardiogram on 02/10/2012 Study Conclusions - Left ventricle: The cavity size was normal. Wall thickness was normal. Systolic function was normal. The estimated ejection fraction was in the range of 55% to 65%. - Mitral valve: Mild regurgitation.  Antibiotics:  None.  Discharge Exam: Filed Vitals:   02/10/12 1400 02/10/12 2140 02/11/12 0615 02/11/12 1003  BP: 115/57 106/59 124/72 104/55  Pulse: 79 65 67 73  Temp: 98.1 F (36.7 C) 98.4 F (36.9 C) 98.1 F (36.7 C) 97.8 F (36.6 C)  TempSrc: Oral Oral Oral Oral  Resp: _0 Height:      Weight:      SpO2: 100% 100% 100% 100%   Discharge Instructions      Discharge Orders    Future Orders Please Complete By Expires   Diet - low sodium heart healthy      Increase activity slowly      Discharge instructions      Comments:   Please followup with [name], MD (PCP) in 1 week.  Did not take any aspirin.  Discuss with your primary care physician about when to resume aspirin.  Medication List     As of 02/11/2012 12:03 PM    STOP taking these medications         aspirin EC 81 MG tablet      TAKE these medications         BIOTIN 5000 PO   Take 1 tablet by mouth once a week. Monday      cycloSPORINE 0.05 % ophthalmic emulsion   Commonly known as: RESTASIS   Place 1 drop into both eyes daily.      levothyroxine 88 MCG tablet   Commonly known as: SYNTHROID, LEVOTHROID   Take 88 mcg by  mouth every morning.      potassium chloride 10 MEQ tablet   Commonly known as: K-DUR   Take 10 mEq by mouth 2 (two) times daily.      VITAMIN C PO   Take 1 tablet by mouth daily.      VITAMIN E PO   Take 1 tablet by mouth daily.           The results of significant diagnostics from this hospitalization (including imaging, microbiology, ancillary and laboratory) are listed below for reference.    Significant Diagnostic Studies: Dg Chest 2 View  02/09/2012  *RADIOLOGY REPORT*  Clinical Data: Syncope; dizziness.  Concern for chest injury.  CHEST - 2 VIEW  Comparison: Chest radiograph performed 12/29/2008, and CT of the chest performed 12/24/2010  Findings: The lungs are mildly hypoexpanded; mild bibasilar atelectasis is noted.  There is no evidence of pleural effusion or pneumothorax.  The heart is normal in size; the mediastinal contour is within normal limits.  No acute osseous abnormalities are seen.  IMPRESSION: Lungs mildly hypoexpanded, with mild bibasilar atelectasis.   Original Report Authenticated By: Santa Lighter, M.D.    Ct Head Wo Contrast  02/10/2012  *RADIOLOGY REPORT*  Clinical Data: Follow-up closed head injury with intracranial hemorrhage  CT HEAD WITHOUT CONTRAST  Technique:  Contiguous axial images were obtained from the base of the skull through the vertex without contrast.  Comparison: 02/08/2011  Findings: A 4-5 mm focus of hemorrhage with in a sulcus at the vertex on the left has not increased.  No more generalized subarachnoid hemorrhage is noted.  Minimal hyperdensity along the left falx remains evident to suggest the possibility of a small left tentorial subdural hematoma.  I think there could also be some mild hemorrhage/hemorrhagic contusion in the left posterior temporal lobe adjacent to that.  This has not progressed. Ventricular size is stable.  No mass effect or shift.  No skull fracture.  No fluid in the sinuses.  Scalp hematoma is smaller.  IMPRESSION: No  increase in intracranial hemorrhage and no development of a worrisome complication.  Small amount of blood in a sulcus at the vertex on the left has not increased.  Small amount of blood, subdural along the left tentorium has not increased.  I do think there may be some mild contusion of the left posterior temporal lobe, not increased.   Original Report Authenticated By: Nelson Chimes, M.D.    Ct Head Wo Contrast  02/08/2012  *RADIOLOGY REPORT*  Clinical Data:  Fall.  CT HEAD WITHOUT CONTRAST CT CERVICAL SPINE WITHOUT CONTRAST  Technique:  Multidetector CT imaging of the head and cervical spine was performed following the standard protocol without intravenous contrast.  Multiplanar CT image reconstructions of the cervical spine were also generated.  Comparison:   None  CT HEAD  Findings: Moderate to large sized left occipital  subcutaneous hematoma.  No underlying skull fracture.  Small amount of subarachnoid blood/cortical parenchymal blood posterior left frontal - parietal junction.  Small left tentorial subdural hematoma.  No CT evidence of large acute infarct.  No hydrocephalus. No intracranial mass lesion detected on this unenhanced exam.  IMPRESSION: Moderate to large sized left occipital subcutaneous hematoma.  No underlying skull fracture.  Small amount of subarachnoid blood/cortical parenchymal blood posterior left frontal - parietal junction.  Small left tentorial subdural hematoma.  CT CERVICAL SPINE  Findings: No evidence of cervical spine fracture.  Slightly blunted appearance of the left C5 superior facet but with no adjacent fracture fragment noted.  Cervical spondylotic changes with various degrees of spinal stenosis and foraminal narrowing.  Anterior slip of C7 felt to be related to the facet joint degenerative changes.  No abnormal prevertebral soft tissue swelling.  No apical pneumothorax.  Carotid bifurcation calcifications.  IMPRESSION: No cervical spine fracture.  Please see above.  Critical  Value/emergent results were called by telephone at the time of interpretation on 02/08/2012 at 11:37 p.m. to Dr. Randal Buba., who verbally acknowledged these results.   Original Report Authenticated By: Genia Del, M.D.    Ct Cervical Spine Wo Contrast  02/08/2012  *RADIOLOGY REPORT*  Clinical Data:  Fall.  CT HEAD WITHOUT CONTRAST CT CERVICAL SPINE WITHOUT CONTRAST  Technique:  Multidetector CT imaging of the head and cervical spine was performed following the standard protocol without intravenous contrast.  Multiplanar CT image reconstructions of the cervical spine were also generated.  Comparison:   None  CT HEAD  Findings: Moderate to large sized left occipital subcutaneous hematoma.  No underlying skull fracture.  Small amount of subarachnoid blood/cortical parenchymal blood posterior left frontal - parietal junction.  Small left tentorial subdural hematoma.  No CT evidence of large acute infarct.  No hydrocephalus. No intracranial mass lesion detected on this unenhanced exam.  IMPRESSION: Moderate to large sized left occipital subcutaneous hematoma.  No underlying skull fracture.  Small amount of subarachnoid blood/cortical parenchymal blood posterior left frontal - parietal junction.  Small left tentorial subdural hematoma.  CT CERVICAL SPINE  Findings: No evidence of cervical spine fracture.  Slightly blunted appearance of the left C5 superior facet but with no adjacent fracture fragment noted.  Cervical spondylotic changes with various degrees of spinal stenosis and foraminal narrowing.  Anterior slip of C7 felt to be related to the facet joint degenerative changes.  No abnormal prevertebral soft tissue swelling.  No apical pneumothorax.  Carotid bifurcation calcifications.  IMPRESSION: No cervical spine fracture.  Please see above.  Critical Value/emergent results were called by telephone at the time of interpretation on 02/08/2012 at 11:37 p.m. to Dr. Randal Buba., who verbally acknowledged these results.    Original Report Authenticated By: Genia Del, M.D.     Microbiology: Recent Results (from the past 240 hour(s))  MRSA PCR SCREENING     Status: Normal   Collection Time   02/09/12  1:49 AM      Component Value Range Status Comment   MRSA by PCR NEGATIVE  NEGATIVE Final      Labs: Basic Metabolic Panel:  Lab 02/77/41 0655 02/09/12 0020  NA 139 140  K 4.2 4.1  CL 104 103  CO2 27 27  GLUCOSE 92 109*  BUN 19 20  CREATININE 1.04 1.10  CALCIUM 9.3 10.2  MG -- --  PHOS -- --   Liver Function Tests:  Lab 02/10/12 0655 02/09/12 0020  AST 17 21  ALT  13 16  ALKPHOS 72 84  BILITOT 0.5 0.3  PROT 6.9 8.0  ALBUMIN 3.2* 3.8   No results found for this basename: LIPASE:5,AMYLASE:5 in the last 168 hours No results found for this basename: AMMONIA:5 in the last 168 hours CBC:  Lab 02/09/12 0020  WBC 8.8  NEUTROABS 5.3  HGB 12.2  HCT 36.6  MCV 93.1  PLT 160   Cardiac Enzymes:  Lab 02/10/12 0655 02/09/12 0845 02/09/12 0020  CKTOTAL -- -- --  CKMB -- -- --  CKMBINDEX -- -- --  TROPONINI <0.30 <0.30 <0.30   BNP: BNP (last 3 results) No results found for this basename: PROBNP:3 in the last 8760 hours CBG: No results found for this basename: GLUCAP:5 in the last 168 hours     Signed:  Allyce Bochicchio A  [facility] 02/11/2012, 12:03 PM

## 2012-06-22 ENCOUNTER — Encounter: Payer: Self-pay | Admitting: Physician Assistant

## 2012-06-24 ENCOUNTER — Telehealth: Payer: Self-pay | Admitting: *Deleted

## 2012-06-24 DIAGNOSIS — R42 Dizziness and giddiness: Secondary | ICD-10-CM

## 2012-06-24 DIAGNOSIS — R55 Syncope and collapse: Secondary | ICD-10-CM

## 2012-06-24 NOTE — Telephone Encounter (Signed)
24 Holter monitor ordered for dizziness/syncope with results to Dr. Wilson Singer.

## 2012-06-29 ENCOUNTER — Encounter: Payer: Self-pay | Admitting: *Deleted

## 2012-06-29 ENCOUNTER — Encounter (INDEPENDENT_AMBULATORY_CARE_PROVIDER_SITE_OTHER): Payer: Medicare Other

## 2012-06-29 DIAGNOSIS — R42 Dizziness and giddiness: Secondary | ICD-10-CM

## 2012-06-29 DIAGNOSIS — R55 Syncope and collapse: Secondary | ICD-10-CM

## 2012-06-29 NOTE — Progress Notes (Signed)
Patient ID: Nancy Meadows, female   DOB: [birth year], 79 y.o.   MRN: [id number] E-Cardio 24 hour holter monitor applied to patient.

## 2012-07-09 ENCOUNTER — Ambulatory Visit (INDEPENDENT_AMBULATORY_CARE_PROVIDER_SITE_OTHER): Payer: Medicare Other | Admitting: Physician Assistant

## 2012-07-09 ENCOUNTER — Encounter: Payer: Self-pay | Admitting: Physician Assistant

## 2012-07-09 VITALS — BP 122/84 | HR 83 | Ht 67.0 in | Wt 165.1 lb

## 2012-07-09 DIAGNOSIS — E039 Hypothyroidism, unspecified: Secondary | ICD-10-CM

## 2012-07-09 DIAGNOSIS — R42 Dizziness and giddiness: Secondary | ICD-10-CM

## 2012-07-09 DIAGNOSIS — R079 Chest pain, unspecified: Secondary | ICD-10-CM

## 2012-07-09 NOTE — Patient Instructions (Addendum)
Your physician recommends that you continue on your current medications as directed. Please refer to the Current Medication list given to you today.   Your physician has requested that you have en exercise stress myoview.Chest Pain For further information please visit [email or web address]. Please follow instruction sheet, as given.  Increase your fluid intake  Your physician recommends that you schedule a follow-up appointment with Dr. Rayann Heman in 2-4 weeks

## 2012-07-09 NOTE — Progress Notes (Signed)
[facility]. [address] [place], [place]  [address] Phone: [phone] Fax:  [phone]  Date:  07/09/2012   ID:  Nancy Meadows, DOB February 22, 1932, MRN [id number]  PCP:  [name], MD  Cardiologist:  Dr. Kirk Ruths     History of Present Illness: Nancy Meadows is a 79 y.o. female who is referred back by her PCP for the evaluation of atrial fibrillation.  She has a history of hypothyroidism, HL. She was seen by Dr. Stanford Breed 02/2010 for the evaluation of chest pain.  She had a history of an echocardiogram 12/1998 that demonstrated normal LV function and an ETT in 03/2007 it was negative.  ETT Myoview 03/2010: No ischemia, soft tissue attenuation, EF 70%. Patient was admitted 01/2012 after suffering a subdural hematoma and subarachnoid hemorrhage in the setting of syncope.  She did not require surgery. Echocardiogram 01/2012: EF 55-65%, mild MR.  Carotid Dopplers 01/2012: Negative for ICA stenosis.  Patient notes recent history of dizziness. This has been ongoing for the past month. She feels lightheaded. She denies further syncope. Lightheadedness may last for 30 seconds. She often gets that while sitting down. She denies any true near syncope. She does note a history of chest fullness with exertion. She has stopped walking at the mall because of this. She notes dyspnea with exertion. She probably describes NYHA class II-IIb symptoms. She denies orthopnea, PND or significant pedal edema.  I received records from her PCPs office. There was some question of atrial fibrillation. The patient had been placed on Pradaxa. In further review of the ECGs, these demonstrated sinus rhythm with PACs. Her Pradaxa has been discontinued. She was set up with a Holter monitor. I received that while the patient was in the room and reviewed it with her. I also reviewed her Holter monitor with Dr. Rayann Heman (DOD). This appears to demonstrate sinus rhythm, sinus bradycardia, PVCs, PACs and some brief  runs of atrial tachycardia. No apparent atrial fibrillation is noted.  Labs (1/14):  K 4.2, Cr 1.04, ALT 13, Hgb 12.2, TSH 0.518  Labs (6/14):  K 4.3, Cr 1.05, ALT 20, Hgb 12, TSH 1.020  Wt Readings from Last 3 Encounters:  02/09/12 166 lb 7.2 oz (75.5 kg)  11/19/10 170 lb (77.111 kg)  11/19/10 170 lb (77.111 kg)     Past Medical History  Diagnosis Date  . HLD (hyperlipidemia)   . Hypothyroid   . Goiter   . Cholelithiasis   . Murmur   . Coronary artery disease     cardiologist- dr Stanford Breed-- last visit  feb'12  note ,ekg &stress test(04-03-10)  in epic  . Arthritis     ddd-- occ. pain  . Eczema   . Lesion of bladder     hematuria/ frequency    Current Outpatient Prescriptions  Medication Sig Dispense Refill  . Ascorbic Acid (VITAMIN C PO) Take 1 tablet by mouth daily.      Marland Kitchen BIOTIN 5000 PO Take 1 tablet by mouth once a week. Monday      . cycloSPORINE (RESTASIS) 0.05 % ophthalmic emulsion Place 1 drop into both eyes daily.      Marland Kitchen levothyroxine (SYNTHROID, LEVOTHROID) 88 MCG tablet Take 88 mcg by mouth every morning.       . potassium chloride (K-DUR) 10 MEQ tablet Take 10 mEq by mouth 2 (two) times daily.      Marland Kitchen VITAMIN E PO Take 1 tablet by mouth daily.       No  current facility-administered medications for this visit.    Allergies:    Allergies  Allergen Reactions  . Acetaminophen Other (See Comments)    Syncope    Social History:  The patient  reports that she has never smoked. She has never used smokeless tobacco. She reports that she does not drink alcohol or use illicit drugs.   ROS:  Please see the history of present illness.      All other systems reviewed and negative.   PHYSICAL EXAM: VS:  BP 122/84  Pulse 83  Ht 5' 7" (1.702 m)  Wt 165 lb 1.9 oz (74.898 kg)  BMI 25.86 kg/m2 Well nourished, well developed, in no acute distress HEENT: normal Neck: no JVD Cardiac:  normal S1, S2; RRR; no murmur Lungs:  clear to auscultation bilaterally, no wheezing,  rhonchi or rales Abd: soft, nontender, no hepatomegaly Ext: no edema Skin: warm and dry Neuro:  CNs 2-12 intact, no focal abnormalities noted  EKG:  NSR, HR 77, frequent PACs, non-specific ST-T wave changes     ASSESSMENT AND PLAN:  1. Dizziness: As noted, I reviewed her Holter monitor and ECGs with Dr. Rayann Heman. She did not have any evidence of atrial fibrillation. With her history of syncope and associated subdural hemorrhage and subarachnoid hemorrhage, she is not an ideal candidate for chronic anticoagulation. At this point, she does not require anticoagulation. It is not clear that she is symptomatic from the PACs and atrial tachycardia noted on her Holter monitor. She does have some chest discomfort. This will be evaluated with stress testing. I will have her followup with Dr. Rayann Heman in several weeks. If she continues to have dizziness, we could consider implantation of a LINQ device (implantable loop recorder). Dr. Rayann Heman can discuss this with her further. 2. Chest Pain:  Arrange ETT-Myoview. This will allow for evaluation for chronotropic incompetence as well as to evaluate for ischemia. 3. Hypothyroidism: Recent TSH normal. 4. Disposition: Follow up with Dr. Rayann Heman in 2-4 weeks.  Signed, Richardson Dopp, PA-C  07/09/2012 2:02 PM

## 2012-07-14 ENCOUNTER — Ambulatory Visit (HOSPITAL_COMMUNITY): Payer: Medicare Other | Attending: Cardiology | Admitting: Radiology

## 2012-07-14 VITALS — BP 144/96 | HR 64 | Ht 67.0 in | Wt 164.0 lb

## 2012-07-14 DIAGNOSIS — R0602 Shortness of breath: Secondary | ICD-10-CM

## 2012-07-14 DIAGNOSIS — R0609 Other forms of dyspnea: Secondary | ICD-10-CM | POA: Insufficient documentation

## 2012-07-14 DIAGNOSIS — R079 Chest pain, unspecified: Secondary | ICD-10-CM

## 2012-07-14 DIAGNOSIS — E785 Hyperlipidemia, unspecified: Secondary | ICD-10-CM | POA: Insufficient documentation

## 2012-07-14 DIAGNOSIS — R5381 Other malaise: Secondary | ICD-10-CM | POA: Insufficient documentation

## 2012-07-14 DIAGNOSIS — R55 Syncope and collapse: Secondary | ICD-10-CM | POA: Insufficient documentation

## 2012-07-14 DIAGNOSIS — R0989 Other specified symptoms and signs involving the circulatory and respiratory systems: Secondary | ICD-10-CM | POA: Insufficient documentation

## 2012-07-14 DIAGNOSIS — R42 Dizziness and giddiness: Secondary | ICD-10-CM | POA: Insufficient documentation

## 2012-07-14 DIAGNOSIS — R0789 Other chest pain: Secondary | ICD-10-CM | POA: Insufficient documentation

## 2012-07-14 MED ORDER — TECHNETIUM TC 99M SESTAMIBI GENERIC - CARDIOLITE
11.0000 | Freq: Once | INTRAVENOUS | Status: AC | PRN
  Administered 2012-07-14: 11 via INTRAVENOUS

## 2012-07-14 MED ORDER — TECHNETIUM TC 99M SESTAMIBI GENERIC - CARDIOLITE
33.0000 | Freq: Once | INTRAVENOUS | Status: AC | PRN
  Administered 2012-07-14: 33 via INTRAVENOUS

## 2012-07-14 NOTE — Progress Notes (Signed)
[facility] [address] [place], [place] [address] [phone]    Cardiology Nuclear Med Study  Nancy Meadows is a 79 y.o. female     MRN : [id number]     DOB: September 04, 1932  Procedure Date: 07/14/2012  Nuclear Med Background Indication for Stress Test:  Evaluation for Ischemia History:  '09 WLS:LHTDSK; '12 AJG:OTLXBW, EF=70%; 02/10/12 Echo:EF=65% Cardiac Risk Factors: Lipids  Symptoms:  Chest Pressure with Exertion (last episode of chest discomfort was last week), Dizziness, DOE, Fatigue, Light-Headedness and Syncope (1/14)   Nuclear Pre-Procedure Caffeine/Decaff Intake:  None NPO After: 7:00pm   Lungs:  Clear. O2 Sat: 98% on room air. IV 0.9% NS with Angio Cath:  22g  IV Site: R Hand  IV Started by:  Matilde Haymaker, RN  Chest Size (in):  40 Cup Size: B  Height: _0  (1.702 m)  Weight:  164 lb (74.39 kg)  BMI:  Body mass index is 25.68 kg/(m^2). Tech Comments:  n/a    Nuclear Med Study 1 or 2 day study: 1 day  Stress Test Type:  Stress  Reading MD: Kirk Ruths, MD  Order Authorizing Provider:  Kirk Ruths, MD and Richardson Dopp, PA-C  Resting Radionuclide: Technetium 69mSestamibi  Resting Radionuclide Dose: 11.0 mCi   Stress Radionuclide:  Technetium 983mestamibi  Stress Radionuclide Dose: 33.0 mCi           Stress Protocol Rest HR: 64 Stress HR: 139  Rest BP: 144/96 Stress BP: 220/93  Exercise Time (min): 3:35 METS: 4.6   Predicted Max HR: 142 bpm % Max HR: 97.89 bpm Rate Pressure Product: 30580   Dose of Adenosine (mg):  n/a Dose of Lexiscan: n/a mg  Dose of Atropine (mg): n/a Dose of Dobutamine: n/a mcg/kg/min (at max HR)  Stress Test Technologist: ShLetta MoynahanCMA-N  Nuclear Technologist:  ElVedia PereyraCNMT     Rest Procedure:  Myocardial perfusion imaging was performed at rest 45 minutes following the intravenous administration of Technetium 9925mstamibi.  Rest ECG: Sinus with first degree AV block, pacs,  RVCD.  Stress Procedure:  The patient exercised on the treadmill utilizing the Bruce Protocol for 3:35 minutes. The patient stopped due to fatigue and denied any chest pain.  Technetium 26m26mtamibi was injected at peak exercise and myocardial perfusion imaging was performed after a brief delay.  Stress ECG: No significant ST segment change suggestive of ischemia.  QPS Raw Data Images:  Acquisition technically good; normal left ventricular size. Stress Images:  There is decreased uptake in the anterior wall. Rest Images:  There is decreased uptake in the anterior wall. Subtraction (SDS):  No evidence of ischemia. Transient Ischemic Dilatation (Normal <1.22):  n/a Lung/Heart Ratio (Normal <0.45):  0.32  Quantitative Gated Spect Images QGS EDV:  63 ml QGS ESV:  14 ml  Impression Exercise Capacity:  Poor exercise capacity. BP Response:  Hypertensive blood pressure response. Clinical Symptoms:  There is dyspnea. ECG Impression:  No significant ST segment change suggestive of ischemia. Comparison with Prior Nuclear Study: No images to compare  Overall Impression:  Low risk stress nuclear study with a moderate size, moderate intensity, fixed anterior defect consistent with soft tissue attenuation; no ischemia..  LV Ejection Fraction: 77%.  LV Wall Motion:  NL LV Function; NL Wall Motion  BriaKirk Ruths

## 2012-07-16 ENCOUNTER — Encounter: Payer: Self-pay | Admitting: Physician Assistant

## 2012-07-16 ENCOUNTER — Telehealth: Payer: Self-pay | Admitting: *Deleted

## 2012-07-16 NOTE — Telephone Encounter (Signed)
Message copied by Michae Kava on Thu Jul 16, 2012 10:06 AM ------      Message from: [place], [place] T      Created: Thu Jul 16, 2012  9:31 AM       Please inform patient stress test normal.      Richardson Dopp, PA-C  9:31 AM 07/16/2012 ------

## 2012-07-16 NOTE — Telephone Encounter (Signed)
lmom myoview normal,

## 2012-07-23 ENCOUNTER — Telehealth: Payer: Self-pay | Admitting: Internal Medicine

## 2012-07-23 NOTE — Telephone Encounter (Signed)
Called patient with nuc stess test results. States she did not get message from [place] on 7/3. Reminded her of appointment with Dr.Allred.

## 2012-07-23 NOTE — Telephone Encounter (Signed)
Follow Up    Pt calling to follow up on test results. Please call.

## 2012-08-17 ENCOUNTER — Ambulatory Visit (INDEPENDENT_AMBULATORY_CARE_PROVIDER_SITE_OTHER): Payer: Medicare Other | Admitting: Internal Medicine

## 2012-08-17 ENCOUNTER — Encounter: Payer: Self-pay | Admitting: Internal Medicine

## 2012-08-17 VITALS — BP 102/63 | HR 81 | Ht 67.5 in | Wt 162.0 lb

## 2012-08-17 DIAGNOSIS — R42 Dizziness and giddiness: Secondary | ICD-10-CM

## 2012-08-17 DIAGNOSIS — R002 Palpitations: Secondary | ICD-10-CM

## 2012-08-17 HISTORY — DX: Palpitations: R00.2

## 2012-08-17 NOTE — Progress Notes (Signed)
PCP:  [name], MD Primary Cardiologist: Kirk Ruths, MD  The patient presents today for electrophysiology followup.  She was seen by Richardson Dopp previously for dizziness and palpitations.  Since last being seen in our clinic, the patient reports doing very well. She has had no further dizzy spells.  She attributes her dizziness to "fumes" from a gas heater.  She has turned off the gas and thinks that her symptoms have resolved with this intervention.  She does report occasional palpitations but these are not bothersome.   Recent myoview demonstrated an EF of 70% with poor exercise tolerance; no ischemia.  She feels however that her exercise tolerance is preserved.  She cares for her elderly husband without difficulty.  She just has to "slow down".  Today, she denies symptoms of chest pain, shortness of breath, orthopnea, PND, lower extremity edema, presyncope, syncope, or neurologic sequela.  The patient feels that she is tolerating medications without difficulties and is otherwise without complaint today.   Past Medical History  Diagnosis Date  . HLD (hyperlipidemia)   . Hypothyroid   . Goiter   . Cholelithiasis   . Murmur   . Coronary artery disease     cardiologist- dr Stanford Breed-- last visit  feb'12  note ,ekg &stress test(04-03-10)  in epic  . Arthritis     ddd-- occ. pain  . Eczema   . Lesion of bladder     hematuria/ frequency  . Hx of cardiovascular stress test     a. ETT-MV 7/14:  Low risk, ant defect c/w soft tissue atten., no ischemia, EF 77%   Past Surgical History  Procedure Laterality Date  . Appendectomy  age 54  . Abdominal hysterectomy  age 62  . Bilateral salpingoophectomy  age 17  . Gallstones removed  age 60  . Cataract extraction w/ intraocular lens  implant, bilateral  2012  . Cystoscopy with biopsy  11/21/2010    Procedure: CYSTOSCOPY WITH BIOPSY;  Surgeon: Molli Hazard, MD;  Location: [facility];  Service: Urology;   Laterality: N/A;  CYSTOSCOPY WITH BLADDER BIOPSY  AND INSTILLATION OF INDIGO CARMINE  . Cystoscopy/retrograde/ureteroscopy  11/21/2010    Procedure: CYSTOSCOPY/RETROGRADE/URETEROSCOPY;  Surgeon: Molli Hazard, MD;  Location: [facility];  Service: Urology;  Laterality: Left;    Current Outpatient Prescriptions  Medication Sig Dispense Refill  . aspirin 325 MG tablet Take 325 mg by mouth daily.      Marland Kitchen BIOTIN 5000 PO Take 1 tablet by mouth once a week. Monday      . cycloSPORINE (RESTASIS) 0.05 % ophthalmic emulsion Place 1 drop into both eyes daily.      Marland Kitchen levothyroxine (SYNTHROID, LEVOTHROID) 88 MCG tablet Take 88 mcg by mouth every morning.       . Multiple Vitamins-Minerals (CENTRUM) tablet Take 1 tablet by mouth daily.      . potassium chloride (K-DUR) 10 MEQ tablet Take 10 mEq by mouth 2 (two) times daily.      Marland Kitchen VITAMIN E PO Take 1 tablet by mouth daily.       No current facility-administered medications for this visit.    Allergies  Allergen Reactions  . Acetaminophen Other (See Comments)    Syncope  . Meclizine Other (See Comments)    Increased dizziness and confusion    History   Social History  . Marital Status: Married    Spouse Name: N/A    Number of Children: N/A  . Years  of Education: N/A   Occupational History  . Not on file.   Social History Main Topics  . Smoking status: Never Smoker   . Smokeless tobacco: Never Used     Comment: tobacco use - no  . Alcohol Use: No  . Drug Use: No  . Sexually Active: Not on file   Other Topics Concern  . Not on file   Social History Narrative   Married, pt time Control and instrumentation engineer.     No family history on file.  ROS-  All systems are reviewed and are negative except as outlined in the HPI above  Physical Exam: Filed Vitals:   08/17/12 0910  BP: 102/63  Pulse: 81  Height: 5' 7.5" (1.715 m)  Weight: 162 lb (73.483 kg)    GEN- The patient is elderly appearing, alert and oriented x 3  today.   Head- normocephalic, atraumatic Eyes-  Sclera clear, conjunctiva pink Ears- hearing intact Oropharynx- clear Neck- supple Lungs- Clear to ausculation bilaterally, normal work of breathing Heart- Regular rate and rhythm, no murmurs, rubs or gallops, PMI not laterally displaced GI- soft, NT, ND, + BS Extremities- no clubbing, cyanosis, or edema MS- no significant deformity or atrophy Skin- no rash or lesion Psych- euthymic mood, full affect Neuro- strength and sensation are intact  holter reviewed Stress test reviewed  Assessment and Plan:  1.  Palpitations- stable,  She has pacs and nonsustained atach.  I do not see afib documented. No indication for anticoagulation at this time  2. Dizziness- resolved She does not wish to pursue further workup.  If further symptoms occur, could consider a implantable loop recorder  Follow-up with Dr Stanford Breed I will see as needed going forward

## 2012-08-17 NOTE — Patient Instructions (Addendum)
Your physician recommends that you schedule a follow-up appointment in: 3 months with Dr Stanford Breed and as needed with Dr Rayann Heman

## 2012-10-13 ENCOUNTER — Other Ambulatory Visit: Payer: Self-pay

## 2012-10-13 DIAGNOSIS — Z1231 Encounter for screening mammogram for malignant neoplasm of breast: Secondary | ICD-10-CM

## 2012-11-12 ENCOUNTER — Ambulatory Visit
Admission: RE | Admit: 2012-11-12 | Discharge: 2012-11-12 | Disposition: A | Discharge status: Home / Self Care | Payer: Medicare Other | Source: Ambulatory Visit

## 2012-11-12 DIAGNOSIS — Z1231 Encounter for screening mammogram for malignant neoplasm of breast: Secondary | ICD-10-CM

## 2012-11-17 ENCOUNTER — Encounter: Payer: Self-pay | Admitting: Cardiology

## 2012-11-17 ENCOUNTER — Encounter: Payer: Medicare Other | Admitting: Cardiology

## 2012-11-17 NOTE — Progress Notes (Signed)
HPI: fu Chest pain. Patient was admitted 01/2012 after suffering a subdural hematoma and subarachnoid hemorrhage in the setting of syncope. She did not require surgery. Echocardiogram 01/2012: EF 55-65%, mild MR. Carotid Dopplers 01/2012: Negative for ICA stenosis. Nuclear study in July of 2014 showed soft tissue attenuation but no ischemia. Her ejection fraction was 77%. Patient seen in June of 2014 for question atrial fibrillation. However review of the monitor showed sinus rhythm, sinus bradycardia, PVCs, PACs and some brief runs of atrial tachycardia. No apparent atrial fibrillation is noted.     Current Outpatient Prescriptions  Medication Sig Dispense Refill  . aspirin 325 MG tablet Take 325 mg by mouth daily.      Marland Kitchen BIOTIN 5000 PO Take 1 tablet by mouth once a week. Monday      . cycloSPORINE (RESTASIS) 0.05 % ophthalmic emulsion Place 1 drop into both eyes daily.      Marland Kitchen levothyroxine (SYNTHROID, LEVOTHROID) 88 MCG tablet Take 88 mcg by mouth every morning.       . Multiple Vitamins-Minerals (CENTRUM) tablet Take 1 tablet by mouth daily.      . potassium chloride (K-DUR) 10 MEQ tablet Take 10 mEq by mouth 2 (two) times daily.      Marland Kitchen VITAMIN E PO Take 1 tablet by mouth daily.       No current facility-administered medications for this visit.     Past Medical History  Diagnosis Date  . HLD (hyperlipidemia)   . Hypothyroid   . Goiter   . Cholelithiasis   . Murmur   . Coronary artery disease     cardiologist- dr Stanford Breed-- last visit  feb'12  note ,ekg &stress test(04-03-10)  in epic  . Arthritis     ddd-- occ. pain  . Eczema   . Lesion of bladder     hematuria/ frequency  . Hx of cardiovascular stress test     a. ETT-MV 7/14:  Low risk, ant defect c/w soft tissue atten., no ischemia, EF 77%    Past Surgical History  Procedure Laterality Date  . Appendectomy  age 22  . Abdominal hysterectomy  age 32  . Bilateral salpingoophectomy  age 44  . Gallstones removed  age 21   . Cataract extraction w/ intraocular lens  implant, bilateral  2012  . Cystoscopy with biopsy  11/21/2010    Procedure: CYSTOSCOPY WITH BIOPSY;  Surgeon: Molli Hazard, MD;  Location: [facility];  Service: Urology;  Laterality: N/A;  CYSTOSCOPY WITH BLADDER BIOPSY  AND INSTILLATION OF INDIGO CARMINE  . Cystoscopy/retrograde/ureteroscopy  11/21/2010    Procedure: CYSTOSCOPY/RETROGRADE/URETEROSCOPY;  Surgeon: Molli Hazard, MD;  Location: [facility];  Service: Urology;  Laterality: Left;    History   Social History  . Marital Status: Married    Spouse Name: N/A    Number of Children: N/A  . Years of Education: N/A   Occupational History  . Not on file.   Social History Main Topics  . Smoking status: Never Smoker   . Smokeless tobacco: Never Used     Comment: tobacco use - no  . Alcohol Use: No  . Drug Use: No  . Sexual Activity: Not on file   Other Topics Concern  . Not on file   Social History Narrative   Married, pt time Control and instrumentation engineer.     ROS: no fevers or chills, productive cough, hemoptysis, dysphasia, odynophagia, melena, hematochezia, dysuria, hematuria, rash, seizure activity, orthopnea,  PND, pedal edema, claudication. Remaining systems are negative.  Physical Exam: Well-developed well-nourished in no acute distress.  Skin is warm and dry.  HEENT is normal.  Neck is supple.  Chest is clear to auscultation with normal expansion.  Cardiovascular exam is regular rate and rhythm.  Abdominal exam nontender or distended. No masses palpated. Extremities show no edema. neuro grossly intact  ECG     This encounter was created in error - please disregard.

## 2012-12-22 ENCOUNTER — Encounter: Payer: Self-pay | Admitting: *Deleted

## 2012-12-22 ENCOUNTER — Encounter: Payer: Self-pay | Admitting: Cardiology

## 2012-12-22 ENCOUNTER — Ambulatory Visit (INDEPENDENT_AMBULATORY_CARE_PROVIDER_SITE_OTHER): Payer: Medicare Other | Admitting: Cardiology

## 2012-12-22 VITALS — BP 116/80 | HR 78 | Ht 68.0 in | Wt 169.0 lb

## 2012-12-22 DIAGNOSIS — R079 Chest pain, unspecified: Secondary | ICD-10-CM

## 2012-12-22 DIAGNOSIS — R55 Syncope and collapse: Secondary | ICD-10-CM

## 2012-12-22 DIAGNOSIS — E785 Hyperlipidemia, unspecified: Secondary | ICD-10-CM

## 2012-12-22 NOTE — Progress Notes (Signed)
HPI: FU chest pain and PAT. Patient was admitted 01/2012 after suffering a subdural hematoma and subarachnoid hemorrhage in the setting of syncope. She did not require surgery. Echocardiogram 01/2012: EF 55-65%, mild MR. Carotid Dopplers 01/2012: Negative for ICA stenosis. Patient seen recently by Richardson Dopp for question atrial fibrillation. However electrocardiograms revealed sinus rhythm with PACs and a Holter monitor showed sinus rhythm with PVCs, PACs and brief runs of atrial tachycardia. No atrial fibrillation. Myoview repeated in July 2014 and showed an ejection fraction of 77%. There is soft tissue attenuation but no ischemia. Patient has had episodes of dizziness and if recurrence implantable loop felt potentially needed based on review of Dr. Jackalyn Lombard notes. Since she was last seen she has some dyspnea on exertion but no orthopnea, PND, pedal edema or chest pain. She has had further dizzy spells. On Thanksgiving she was washing dishes. She developed sudden dizziness followed by syncope. No associated palpitations, dyspnea, chest pain, nausea or diaphoresis. No seizure activity or loss of consciousness. These episodes typically last 30 seconds and resolve.   Current Outpatient Prescriptions  Medication Sig Dispense Refill  . aspirin 325 MG tablet Take 325 mg by mouth every 4 (four) hours as needed.       . cycloSPORINE (RESTASIS) 0.05 % ophthalmic emulsion Place 1 drop into both eyes daily.      Marland Kitchen levothyroxine (SYNTHROID, LEVOTHROID) 88 MCG tablet Take 88 mcg by mouth every morning.       . Multiple Vitamins-Minerals (CENTRUM) tablet Take 1 tablet by mouth daily.      . potassium chloride (K-DUR) 10 MEQ tablet Take 10 mEq by mouth 2 (two) times daily.      Marland Kitchen VITAMIN E PO Take 1 tablet by mouth daily.       No current facility-administered medications for this visit.     Past Medical History  Diagnosis Date  . HLD (hyperlipidemia)   . Hypothyroid   . Goiter   . Cholelithiasis     . Arthritis     ddd-- occ. pain  . Eczema   . Lesion of bladder     hematuria/ frequency  . Hx of cardiovascular stress test     a. ETT-MV 7/14:  Low risk, ant defect c/w soft tissue atten., no ischemia, EF 77%  . SAH (subarachnoid hemorrhage)   . SDH (subdural hematoma)   . Syncope     Past Surgical History  Procedure Laterality Date  . Appendectomy  age 48  . Abdominal hysterectomy  age 58  . Bilateral salpingoophectomy  age 40  . Gallstones removed  age 87  . Cataract extraction w/ intraocular lens  implant, bilateral  2012  . Cystoscopy with biopsy  11/21/2010    Procedure: CYSTOSCOPY WITH BIOPSY;  Surgeon: Molli Hazard, MD;  Location: [facility];  Service: Urology;  Laterality: N/A;  CYSTOSCOPY WITH BLADDER BIOPSY  AND INSTILLATION OF INDIGO CARMINE  . Cystoscopy/retrograde/ureteroscopy  11/21/2010    Procedure: CYSTOSCOPY/RETROGRADE/URETEROSCOPY;  Surgeon: Molli Hazard, MD;  Location: [facility];  Service: Urology;  Laterality: Left;    History   Social History  . Marital Status: Married    Spouse Name: N/A    Number of Children: N/A  . Years of Education: N/A   Occupational History  . Not on file.   Social History Main Topics  . Smoking status: Never Smoker   . Smokeless tobacco: Never Used     Comment: tobacco  use - no  . Alcohol Use: No  . Drug Use: No  . Sexual Activity: Not on file   Other Topics Concern  . Not on file   Social History Narrative   Married, pt time Control and instrumentation engineer.     ROS: no fevers or chills, productive cough, hemoptysis, dysphasia, odynophagia, melena, hematochezia, dysuria, hematuria, rash, seizure activity, orthopnea, PND, pedal edema, claudication. Remaining systems are negative.  Physical Exam: Well-developed well-nourished in no acute distress.  Skin is warm and dry.  HEENT is normal.  Neck is supple.  Chest is clear to auscultation with normal expansion.  Cardiovascular  exam is regular rate and rhythm.  Abdominal exam nontender or distended. No masses palpated. Extremities show no edema. neuro grossly intact  ECG ssinus rhythm at a rate of 78. RV conduction delay. Left axis deviation. First degree AV block.

## 2012-12-22 NOTE — Assessment & Plan Note (Signed)
No further symptoms. Nuclear study showed soft tissue attenuation but no ischemia and normal LV function. No further evaluation at this time.

## 2012-12-22 NOTE — Assessment & Plan Note (Signed)
Patient has had another syncopal episode. Etiology unclear. We will arrange an implantable loop. I have instructed her not to drive for 6 months.

## 2012-12-22 NOTE — Patient Instructions (Addendum)
Your physician recommends that you schedule a follow-up appointment in: 3 MONTHS WITH DR CRENSHAW   REPORT TO [facility] ON 12-31-12 @ 9:30 AM FOR LOOP RECORDER INSERTION - LINQ

## 2012-12-22 NOTE — Assessment & Plan Note (Signed)
Management per primary care. 

## 2012-12-23 ENCOUNTER — Encounter (HOSPITAL_COMMUNITY): Payer: Self-pay | Admitting: Pharmacy Technician

## 2012-12-31 ENCOUNTER — Encounter (HOSPITAL_COMMUNITY)
Admission: RE | Disposition: A | Discharge status: Home / Self Care | Payer: Self-pay | Source: Ambulatory Visit | Attending: Internal Medicine

## 2012-12-31 ENCOUNTER — Ambulatory Visit (HOSPITAL_COMMUNITY)
Admission: RE | Admit: 2012-12-31 | Discharge: 2012-12-31 | Disposition: A | Discharge status: Home / Self Care | Payer: Medicare Other | Source: Ambulatory Visit | Attending: Internal Medicine | Admitting: Internal Medicine

## 2012-12-31 DIAGNOSIS — R55 Syncope and collapse: Secondary | ICD-10-CM | POA: Diagnosis present

## 2012-12-31 DIAGNOSIS — R002 Palpitations: Secondary | ICD-10-CM | POA: Diagnosis present

## 2012-12-31 DIAGNOSIS — R42 Dizziness and giddiness: Secondary | ICD-10-CM | POA: Diagnosis present

## 2012-12-31 HISTORY — PX: LOOP RECORDER IMPLANT: SHX5477

## 2012-12-31 SURGERY — LOOP RECORDER IMPLANT
Anesthesia: LOCAL

## 2012-12-31 MED ORDER — LIDOCAINE-EPINEPHRINE 1 %-1:100000 IJ SOLN
INTRAMUSCULAR | Status: AC
  Filled 2012-12-31: qty 1

## 2012-12-31 NOTE — Progress Notes (Signed)
   SUBJECTIVE: The patient is doing well today.  At this time, she denies chest pain, shortness of breath, or any new concerns.  She was recently seen in the office by Dr Stanford Breed after having another syncopal spell.  Her dizziness has also recurred.  She has occasional palpitations.  She has worn a holter monitor which I have previously reviewed which was unrevealing.  Myoview in the July of this year demonstrated an EF of 70% with poor exercise tolerance but no ischemia. Echocardiogram in January of this year demonstrated an EF of 55-65%, no RWMA.  OBJECTIVE: Physical Exam: There were no vitals filed for this visit. No intake or output data in the 24 hours ending 12/31/12 0946  GEN- The patient is well appearing, alert and oriented x 3 today.   Head- normocephalic, atraumatic Eyes-  Sclera clear, conjunctiva pink Ears- hearing intact Oropharynx- clear Neck- supple  Lungs- Clear to ausculation bilaterally, normal work of breathing Heart- Regular rate and rhythm  GI- soft, NT, ND, + BS Extremities- no clubbing, cyanosis, or edema  ASSESSMENT AND PLAN:  1.  Recurrent syncope Unclear etiology.  Given dizziness, recurrent unexplained syncope, and palpitations I agree with Dr Stanford Breed that an implantable loop recorder is reasonable. Risks, benefits, and alternatives to implantable loop recorder placement were discussed at length with the patient who wishes to proceed. She should not drive for 6 months following unexplained syncope.

## 2012-12-31 NOTE — H&P (View-Only) (Signed)
SUBJECTIVE: The patient is doing well today.  At this time, she denies chest pain, shortness of breath, or any new concerns.  She was recently seen in the office by Dr Stanford Breed after having another syncopal spell.  Her dizziness has also recurred.  She has occasional palpitations.  She has worn a holter monitor which I have previously reviewed which was unrevealing.  Myoview in the July of this year demonstrated an EF of 70% with poor exercise tolerance but no ischemia. Echocardiogram in January of this year demonstrated an EF of 55-65%, no RWMA.  OBJECTIVE: Physical Exam: There were no vitals filed for this visit. No intake or output data in the 24 hours ending 12/31/12 0946  GEN- The patient is well appearing, alert and oriented x 3 today.   Head- normocephalic, atraumatic Eyes-  Sclera clear, conjunctiva pink Ears- hearing intact Oropharynx- clear Neck- supple  Lungs- Clear to ausculation bilaterally, normal work of breathing Heart- Regular rate and rhythm  GI- soft, NT, ND, + BS Extremities- no clubbing, cyanosis, or edema  ASSESSMENT AND PLAN:  1.  Recurrent syncope Unclear etiology.  Given dizziness, recurrent unexplained syncope, and palpitations I agree with Dr Stanford Breed that an implantable loop recorder is reasonable. Risks, benefits, and alternatives to implantable loop recorder placement were discussed at length with the patient who wishes to proceed. She should not drive for 6 months following unexplained syncope.

## 2012-12-31 NOTE — CV Procedure (Signed)
SURGEON:  Thompson Grayer, MD     PREPROCEDURE DIAGNOSIS:  Recurrent unexplained syncope    POSTPROCEDURE DIAGNOSIS:  Recurrent unexplained syncope     PROCEDURES:   1. Implantable loop recorder implantation    INTRODUCTION:  Nancy Meadows is a 79 y.o. female with a history of unexplained syncope who presents today for implantable loop implantation.  The patient has had several episodes of unexplained syncope.  Despite an extensive workup, no reversible causes have been identified.  she has worn a holter monitor during which she did not have arrhythmias or syncope. The patient therefore presents today for implantable loop implantation.     DESCRIPTION OF PROCEDURE:  Informed written consent was obtained, and the patient was brought to the electrophysiology lab in a fasting state.  The patient required no sedation for the procedure today.  Mapping over the patient's chest was performed by the EP lab staff to identify the area where electrograms were most prominent for ILR recording.  This area was found to be the left parasternal region over the 3rd-4th intercostal space. The patients left chest was therefore prepped and draped in the usual sterile fashion by the EP lab staff. The skin overlying the left parasternal region was infiltrated with lidocaine for local analgesia.  A 0.5-cm incision was made over the left parasternal region over the 3rd intercostal space.  A subcutaneous ILR pocket was fashioned using a combination of sharp and blunt dissection.  A Medtronic Reveal [place] model [id number] implantable loop recorder was then placed into the pocket  R waves were very prominent and measured 0.45m.  Steri- Strips and a sterile dressing were then applied.  There were no early apparent complications.     CONCLUSIONS:   1. Successful implantation of a Medtronic Reveal LINQ implantable loop recorder for syncope  2. No early apparent complications.

## 2012-12-31 NOTE — Interval H&P Note (Signed)
History and Physical Interval Note:  12/31/2012 10:39 AM  Nancy Meadows  has presented today for surgery, with the diagnosis of Syncope  The various methods of treatment have been discussed with the patient and family. After consideration of risks, benefits and other options for treatment, the patient has consented to  Procedure(s): LOOP RECORDER IMPLANT (N/A) as a surgical intervention .  The patient's history has been reviewed, patient examined, no change in status, stable for surgery.  I have reviewed the patient's chart and labs.  Questions were answered to the patient's satisfaction.     Thompson Grayer

## 2013-01-01 ENCOUNTER — Ambulatory Visit (INDEPENDENT_AMBULATORY_CARE_PROVIDER_SITE_OTHER): Payer: Medicare Other | Admitting: *Deleted

## 2013-01-01 ENCOUNTER — Telehealth: Payer: Self-pay | Admitting: Cardiology

## 2013-01-01 DIAGNOSIS — R55 Syncope and collapse: Secondary | ICD-10-CM

## 2013-01-01 NOTE — Progress Notes (Signed)
The patient was seen in clinic today for concerns of oozing from her ILR placement done 12/31/12.  No further oozing noted and steri strips were reapplied.  Follow up 12/31 for a wound check.

## 2013-01-01 NOTE — Telephone Encounter (Signed)
New message   Loop was implanted incision is bleeding. Started last night.. Bandaged as well as she could// Transferred to device clinic.

## 2013-01-01 NOTE — Telephone Encounter (Signed)
Having pt come in this morning.

## 2013-01-13 ENCOUNTER — Ambulatory Visit (INDEPENDENT_AMBULATORY_CARE_PROVIDER_SITE_OTHER): Payer: Medicare Other | Admitting: *Deleted

## 2013-01-13 ENCOUNTER — Encounter: Payer: Self-pay | Admitting: Internal Medicine

## 2013-01-13 DIAGNOSIS — R55 Syncope and collapse: Secondary | ICD-10-CM

## 2013-01-13 LAB — MDC_IDC_ENUM_SESS_TYPE_INCLINIC
Zone Setting Detection Interval: 2000 ms
Zone Setting Detection Interval: 390 ms

## 2013-01-13 NOTE — Progress Notes (Signed)
Pt seen in device clinic for follow up of recently implanted loop recorder.  Wound well healed.  No redness, swelling, or edema.  Steri-strips removed today.   Device interrogated and found to be functioning normally.  No changes made today. See PaceArt for full details.  Pt denies chest pain, shortness of breath, palpitations, or dizziness.  Pt to follow up with Dr. Rayann Heman in 3 mo.

## 2013-02-17 ENCOUNTER — Ambulatory Visit (INDEPENDENT_AMBULATORY_CARE_PROVIDER_SITE_OTHER): Payer: Medicare HMO | Admitting: *Deleted

## 2013-02-17 ENCOUNTER — Telehealth: Payer: Self-pay | Admitting: *Deleted

## 2013-02-17 ENCOUNTER — Encounter: Payer: Self-pay | Admitting: Internal Medicine

## 2013-02-17 DIAGNOSIS — I495 Sick sinus syndrome: Secondary | ICD-10-CM

## 2013-02-17 NOTE — Telephone Encounter (Signed)
Spoke w/pt in regards to [facility] monitor being disconnected. Pt successfully sent a manuel transmission with monitor.

## 2013-03-05 ENCOUNTER — Encounter: Payer: Self-pay | Admitting: Internal Medicine

## 2013-03-05 ENCOUNTER — Ambulatory Visit (INDEPENDENT_AMBULATORY_CARE_PROVIDER_SITE_OTHER): Payer: Medicare HMO | Admitting: Internal Medicine

## 2013-03-05 VITALS — BP 134/74 | HR 84 | Ht 67.5 in | Wt 170.0 lb

## 2013-03-05 DIAGNOSIS — R002 Palpitations: Secondary | ICD-10-CM

## 2013-03-05 DIAGNOSIS — I495 Sick sinus syndrome: Secondary | ICD-10-CM

## 2013-03-05 DIAGNOSIS — R55 Syncope and collapse: Secondary | ICD-10-CM

## 2013-03-05 LAB — MDC_IDC_ENUM_SESS_TYPE_INCLINIC
Date Time Interrogation Session: 20150220133244
MDC IDC SET ZONE DETECTION INTERVAL: 2000 ms
Zone Setting Detection Interval: 3000 ms
Zone Setting Detection Interval: 390 ms

## 2013-03-05 NOTE — Patient Instructions (Signed)
Your physician has recommended that you have a pacemaker inserted. A pacemaker is a small device that is placed under the skin of your chest or abdomen to help control abnormal heart rhythms. This device uses electrical pulses to prompt the heart to beat at a normal rate. Pacemakers are used to treat heart rhythms that are too slow. Wire (leads) are attached to the pacemaker that goes into the chambers of you heart. This is done in the hospital and usually requires and overnight stay. Please see the instruction sheet given to you today for more information.    Your physician recommends that you schedule a follow-up appointment in:  4 weeks with Dr Rayann Heman

## 2013-03-07 ENCOUNTER — Encounter: Payer: Self-pay | Admitting: Internal Medicine

## 2013-03-07 DIAGNOSIS — I495 Sick sinus syndrome: Secondary | ICD-10-CM | POA: Insufficient documentation

## 2013-03-07 NOTE — Progress Notes (Signed)
PCP:  [name], MD Primary Cardiologist:  Dr Stanford Breed  The patient presents today for electrophysiology followup. Her implantable loop recorder (ILR) has recently documented pauses for which she was symptomatic.  She reports that she had brief syncope which corresponded to a pause 01/13/13 of 4 seconds.  She has had several other pauses and thinks that she had dizziness with these.  Today, she denies symptoms of palpitations, chest pain, shortness of breath, orthopnea, PND, lower extremity edema, or neurologic sequela.  The patient feels that she is tolerating medications without difficulties and is otherwise without complaint today.   Past Medical History  Diagnosis Date  . HLD (hyperlipidemia)   . Hypothyroid   . Goiter   . Cholelithiasis   . Arthritis     ddd-- occ. pain  . Eczema   . Lesion of bladder     hematuria/ frequency  . Hx of cardiovascular stress test     a. ETT-MV 7/14:  Low risk, ant defect c/w soft tissue atten., no ischemia, EF 77%  . SAH (subarachnoid hemorrhage)   . SDH (subdural hematoma)   . Syncope     pauses of >4 seconds   Past Surgical History  Procedure Laterality Date  . Appendectomy  age 36  . Abdominal hysterectomy  age 19  . Bilateral salpingoophectomy  age 19  . Gallstones removed  age 7  . Cataract extraction w/ intraocular lens  implant, bilateral  2012  . Cystoscopy with biopsy  11/21/2010    Procedure: CYSTOSCOPY WITH BIOPSY;  Surgeon: Molli Hazard, MD;  Location: [facility];  Service: Urology;  Laterality: N/A;  CYSTOSCOPY WITH BLADDER BIOPSY  AND INSTILLATION OF INDIGO CARMINE  . Cystoscopy/retrograde/ureteroscopy  11/21/2010    Procedure: CYSTOSCOPY/RETROGRADE/URETEROSCOPY;  Surgeon: Molli Hazard, MD;  Location: [facility];  Service: Urology;  Laterality: Left;    Current Outpatient Prescriptions  Medication Sig Dispense Refill  . aspirin 325 MG tablet Take 325 mg by mouth every  4 (four) hours as needed (for pain).       Marland Kitchen betamethasone valerate ointment (VALISONE) 0.1 % Apply 1 application topically as needed.      . cycloSPORINE (RESTASIS) 0.05 % ophthalmic emulsion Place 1 drop into both eyes daily.      Marland Kitchen levothyroxine (SYNTHROID, LEVOTHROID) 88 MCG tablet Take 88 mcg by mouth every morning.       . Multiple Vitamins-Minerals (CENTRUM) tablet Take 1 tablet by mouth daily.      . potassium citrate (UROCIT-K) 10 MEQ (1080 MG) SR tablet Take 10 mEq by mouth 2 (two) times daily.      Marland Kitchen tobramycin (TOBREX) 0.3 % ophthalmic solution Place 1 drop into the right eye daily.      Marland Kitchen VITAMIN E PO Take 1 tablet by mouth daily.       No current facility-administered medications for this visit.    Allergies  Allergen Reactions  . Acetaminophen Other (See Comments)    Syncope  . Meclizine Other (See Comments)    Increased dizziness and confusion    History   Social History  . Marital Status: Married    Spouse Name: N/A    Number of Children: N/A  . Years of Education: N/A   Occupational History  . Not on file.   Social History Main Topics  . Smoking status: Never Smoker   . Smokeless tobacco: Never Used     Comment: tobacco use - no  . Alcohol  Use: No  . Drug Use: No  . Sexual Activity: Not on file   Other Topics Concern  . Not on file   Social History Narrative   Married, pt time Control and instrumentation engineer.     No family history on file.  ROS-  All systems are reviewed and are negative except as outlined in the HPI above  Physical Exam: Filed Vitals:   03/05/13 1232  BP: 134/74  Pulse: 84  Height: 5' 7.5" (1.715 m)  Weight: 170 lb (77.111 kg)    GEN- The patient is elderly appearing, alert and oriented x 3 today.   Head- normocephalic, atraumatic Eyes-  Sclera clear, conjunctiva pink Ears- hearing intact Oropharynx- clear Neck- supple, no JVP Lymph- no cervical lymphadenopathy Lungs- Clear to ausculation bilaterally, normal work of  breathing Heart- Regular rate and rhythm, no murmurs, rubs or gallops, PMI not laterally displaced GI- soft, NT, ND, + BS Extremities- no clubbing, cyanosis, or edema MS- no significant deformity or atrophy Skin- no rash or lesion Psych- euthymic mood, full affect Neuro- strength and sensation are intact  ILR interrogation is reviewed- see paceArt  Assessment and Plan:  1. Syncope She has documented sick sinus syndrome with symptomatic pauses of > 4 seconds.  No reversible causes have been found.  I have therefore recommended PPM implantation.  As her histogram is very normal at other times, I think that she would be a very good candidate for a leadless pacemaker.  Risks and benefits, to both traditional and leadless pacemaker were discussed at length.  Unfortunately, she is clear in her decision to decline any type of pacemaker at this time.  I have cautioned her about my concerns that she could pass out again and sustain significant (potentially life threatening) injury.  Her spouse is with her and understands my concern.  The patient is aware that she should not drive until after PPM implantation. She will return for further discussion in 4 weeks.  She is instructed to contact my office if she wishes to proceed with pacemaker implantation in the interim.

## 2013-03-09 ENCOUNTER — Telehealth: Payer: Self-pay | Admitting: *Deleted

## 2013-03-09 NOTE — Telephone Encounter (Signed)
Spoke with patient about Dr Jackalyn Lombard recommendations for pacemaker placement.  She is still considering her options, but is leaning towards pacemaker implantation. She has an appt with her primary cardiologist, Dr Stanford Breed, next Tuesday 3-3 and will decide for sure after speaking with him.   She is interested in the Leadless II study and the research team has been notified so they can meet with her after appt with Dr Stanford Breed next week.  Pt aware to call back with problems/concerns between now and then.

## 2013-03-15 ENCOUNTER — Encounter (HOSPITAL_COMMUNITY): Payer: Self-pay | Admitting: Pharmacy Technician

## 2013-03-15 ENCOUNTER — Telehealth: Payer: Self-pay | Admitting: Internal Medicine

## 2013-03-15 ENCOUNTER — Other Ambulatory Visit: Payer: Self-pay | Admitting: *Deleted

## 2013-03-15 DIAGNOSIS — R55 Syncope and collapse: Secondary | ICD-10-CM

## 2013-03-15 DIAGNOSIS — I495 Sick sinus syndrome: Secondary | ICD-10-CM

## 2013-03-15 NOTE — Telephone Encounter (Signed)
Spoke with patient and let her know her PPM implant is scheduled for 03/18/13 at 1pm  Check in at the [facility] at 11.  NPO after MN.  She is aware.  Will get labs at hospital the day of.  I will cancel her appointment with Dr Stanford Breed for tomorrow

## 2013-03-15 NOTE — Telephone Encounter (Signed)
New message    Calling to talk to [facility] regarding getting a pacemaker.  Should pt keep appt with Dr Stanford Breed tomorrow?

## 2013-03-16 ENCOUNTER — Ambulatory Visit: Payer: Medicare HMO | Admitting: Cardiology

## 2013-03-17 MED ORDER — CHLORHEXIDINE GLUCONATE 4 % EX LIQD
60.0000 mL | Freq: Once | CUTANEOUS | Status: DC
  Filled 2013-03-17: qty 60

## 2013-03-17 MED ORDER — SODIUM CHLORIDE 0.9 % IV SOLN: INTRAVENOUS | Status: DC

## 2013-03-17 MED ORDER — CEFAZOLIN SODIUM-DEXTROSE 2-3 GM-% IV SOLR: 2.0000 g | INTRAVENOUS | Status: AC

## 2013-03-17 MED ORDER — SODIUM CHLORIDE 0.9 % IR SOLN
80.0000 mg | Status: AC
  Filled 2013-03-17: qty 2

## 2013-03-18 ENCOUNTER — Ambulatory Visit (HOSPITAL_COMMUNITY): Admission: RE | Admit: 2013-03-18 | Payer: Medicare HMO | Source: Ambulatory Visit | Admitting: Internal Medicine

## 2013-03-18 SURGERY — PERMANENT PACEMAKER INSERTION
Anesthesia: LOCAL

## 2013-03-24 ENCOUNTER — Telehealth: Payer: Self-pay | Admitting: *Deleted

## 2013-03-24 NOTE — Telephone Encounter (Signed)
See note from Adonis Huguenin as she spoke with patient

## 2013-03-24 NOTE — Telephone Encounter (Signed)
Message copied by Dionicio Stall on Wed Mar 24, 2013  9:20 AM ------      Message from: Sandie Ano D      Created: Tue Mar 23, 2013  3:04 PM       Claiborne Billings, I spoke to [employer] and she does not want to do the Leadless Study. I offered to set her up for regular pacemaker but she stated she would call you at the office. ------

## 2013-03-26 ENCOUNTER — Ambulatory Visit (INDEPENDENT_AMBULATORY_CARE_PROVIDER_SITE_OTHER): Payer: Medicare HMO | Admitting: *Deleted

## 2013-03-26 DIAGNOSIS — R55 Syncope and collapse: Secondary | ICD-10-CM

## 2013-03-27 LAB — MDC_IDC_ENUM_SESS_TYPE_REMOTE
Date Time Interrogation Session: 20150313021117
MDC IDC SET ZONE DETECTION INTERVAL: 2000 ms
Zone Setting Detection Interval: 3000 ms
Zone Setting Detection Interval: 390 ms

## 2013-04-02 LAB — MDC_IDC_ENUM_SESS_TYPE_REMOTE
Date Time Interrogation Session: 20150204165759
MDC IDC SET ZONE DETECTION INTERVAL: 3000 ms
Zone Setting Detection Interval: 2000 ms
Zone Setting Detection Interval: 390 ms

## 2013-04-12 ENCOUNTER — Encounter: Payer: Self-pay | Admitting: Internal Medicine

## 2013-04-23 ENCOUNTER — Telehealth: Payer: Self-pay | Admitting: Internal Medicine

## 2013-04-23 NOTE — Telephone Encounter (Signed)
New problem   Pt returning a call from nurse. Please call pt.

## 2013-04-23 NOTE — Telephone Encounter (Signed)
Will give to [facility] to schedule

## 2013-04-26 ENCOUNTER — Ambulatory Visit (INDEPENDENT_AMBULATORY_CARE_PROVIDER_SITE_OTHER): Payer: Medicare HMO | Admitting: *Deleted

## 2013-04-26 DIAGNOSIS — R55 Syncope and collapse: Secondary | ICD-10-CM

## 2013-04-26 DIAGNOSIS — S065X9A Traumatic subdural hemorrhage with loss of consciousness of unspecified duration, initial encounter: Secondary | ICD-10-CM

## 2013-04-26 DIAGNOSIS — S065XAA Traumatic subdural hemorrhage with loss of consciousness status unknown, initial encounter: Secondary | ICD-10-CM

## 2013-04-26 DIAGNOSIS — I6200 Nontraumatic subdural hemorrhage, unspecified: Secondary | ICD-10-CM

## 2013-04-30 ENCOUNTER — Encounter: Payer: Self-pay | Admitting: Internal Medicine

## 2013-05-05 ENCOUNTER — Encounter: Payer: Self-pay | Admitting: Internal Medicine

## 2013-05-13 ENCOUNTER — Encounter: Payer: Self-pay | Admitting: Internal Medicine

## 2013-05-13 ENCOUNTER — Ambulatory Visit (INDEPENDENT_AMBULATORY_CARE_PROVIDER_SITE_OTHER): Payer: Medicare HMO | Admitting: Internal Medicine

## 2013-05-13 ENCOUNTER — Encounter: Payer: Self-pay | Admitting: *Deleted

## 2013-05-13 VITALS — BP 144/90 | HR 73 | Ht 67.5 in | Wt 170.0 lb

## 2013-05-13 DIAGNOSIS — R55 Syncope and collapse: Secondary | ICD-10-CM

## 2013-05-13 DIAGNOSIS — I495 Sick sinus syndrome: Secondary | ICD-10-CM

## 2013-05-13 LAB — CBC WITH DIFFERENTIAL/PLATELET
BASOS ABS: 0 10*3/uL (ref 0.0–0.1)
Basophils Relative: 0.5 % (ref 0.0–3.0)
EOS PCT: 1.4 % (ref 0.0–5.0)
Eosinophils Absolute: 0.1 10*3/uL (ref 0.0–0.7)
HEMATOCRIT: 35.4 % — AB (ref 36.0–46.0)
HEMOGLOBIN: 11.7 g/dL — AB (ref 12.0–15.0)
LYMPHS ABS: 1.9 10*3/uL (ref 0.7–4.0)
Lymphocytes Relative: 26.4 % (ref 12.0–46.0)
MCHC: 33 g/dL (ref 30.0–36.0)
MCV: 95.8 fl (ref 78.0–100.0)
Monocytes Absolute: 0.3 10*3/uL (ref 0.1–1.0)
Monocytes Relative: 4.9 % (ref 3.0–12.0)
NEUTROS ABS: 4.8 10*3/uL (ref 1.4–7.7)
Neutrophils Relative %: 66.8 % (ref 43.0–77.0)
PLATELETS: 153 10*3/uL (ref 150.0–400.0)
RBC: 3.7 Mil/uL — ABNORMAL LOW (ref 3.87–5.11)
RDW: 14.8 % — ABNORMAL HIGH (ref 11.5–14.6)
WBC: 7.2 10*3/uL (ref 4.5–10.5)

## 2013-05-13 LAB — BASIC METABOLIC PANEL
BUN: 17 mg/dL (ref 6–23)
CO2: 28 mEq/L (ref 19–32)
Calcium: 9.4 mg/dL (ref 8.4–10.5)
Chloride: 104 mEq/L (ref 96–112)
Creatinine, Ser: 0.9 mg/dL (ref 0.4–1.2)
GFR: 74.82 mL/min (ref >60.00)
GLUCOSE: 94 mg/dL (ref 70–99)
Potassium: 4.1 mEq/L (ref 3.5–5.1)
Sodium: 137 mEq/L (ref 135–145)

## 2013-05-13 LAB — MDC_IDC_ENUM_SESS_TYPE_INCLINIC
MDC IDC SET ZONE DETECTION INTERVAL: 390 ms
Zone Setting Detection Interval: 2000 ms
Zone Setting Detection Interval: 3000 ms

## 2013-05-13 NOTE — Patient Instructions (Addendum)
Your physician has recommended that you have a pacemaker inserted. A pacemaker is a small device that is placed under the skin of your chest or abdomen to help control abnormal heart rhythms. This device uses electrical pulses to prompt the heart to beat at a normal rate. Pacemakers are used to treat heart rhythms that are too slow. Wire (leads) are attached to the pacemaker that goes into the chambers of you heart. This is done in the hospital and usually requires and overnight stay. Please see the instruction sheet given to you today for more information.  See instruction sheet

## 2013-05-13 NOTE — Progress Notes (Signed)
PCP: Nancy Bolt, MD Primary Cardiologist: Lenon Curt Nancy Meadows is a 80 y.o. female who presents today for routine electrophysiology followup.  Since last being seen in our clinic, the patient reports doing reasonably well. Her ILR was implanted 12/2012 for evaluation of syncope.  Her ILR has demonstrated multiple pauses associated with pre-syncope.  She has been offered pacemaker implantation in the past which she has previously declined.    Today, she denies symptoms of palpitations, chest pain, shortness of breath,  lower extremity edema,  or syncope.  She continues to have dizziness. The patient is otherwise without complaint today.   Past Medical History  Diagnosis Date  . HLD (hyperlipidemia)   . Hypothyroid   . Goiter   . Cholelithiasis   . Arthritis     ddd-- occ. pain  . Eczema   . Lesion of bladder     hematuria/ frequency  . Hx of cardiovascular stress test     a. ETT-MV 7/14:  Low risk, ant defect c/w soft tissue atten., no ischemia, EF 77%  . SAH (subarachnoid hemorrhage)   . SDH (subdural hematoma)   . Syncope     pauses of >4 seconds   Past Surgical History  Procedure Laterality Date  . Appendectomy  age 31  . Abdominal hysterectomy  age 69  . Bilateral salpingoophectomy  age 74  . Gallstones removed  age 13  . Cataract extraction w/ intraocular lens  implant, bilateral  2012  . Cystoscopy with biopsy  11/21/2010    Procedure: CYSTOSCOPY WITH BIOPSY;  Surgeon: Molli Hazard, MD;  Location: [facility];  Service: Urology;  Laterality: N/A;  CYSTOSCOPY WITH BLADDER BIOPSY  AND INSTILLATION OF INDIGO CARMINE  . Cystoscopy/retrograde/ureteroscopy  11/21/2010    Procedure: CYSTOSCOPY/RETROGRADE/URETEROSCOPY;  Surgeon: Molli Hazard, MD;  Location: [facility];  Service: Urology;  Laterality: Left;    Current Outpatient Prescriptions  Medication Sig Dispense Refill  . aspirin 325 MG tablet Take 1/2 ASA 325 mg as  needed      . beta carotene w/minerals (OCUVITE) tablet Take 1 tablet by mouth daily.      . betamethasone valerate ointment (VALISONE) 0.1 % Apply 1 application topically daily as needed (eczema).       . cycloSPORINE (RESTASIS) 0.05 % ophthalmic emulsion Place 1 drop into both eyes daily.      Marland Kitchen levothyroxine (SYNTHROID, LEVOTHROID) 88 MCG tablet Take 88 mcg by mouth every morning.       . Multiple Vitamin (MULTIVITAMIN WITH MINERALS) TABS tablet Take 1 tablet by mouth daily.      . potassium citrate (UROCIT-K) 10 MEQ (1080 MG) SR tablet Take 10 mEq by mouth 2 (two) times daily.      Marland Kitchen tobramycin (TOBREX) 0.3 % ophthalmic solution Place 1 drop into the right eye See admin instructions. Once monthly, administer 1 drop into right eye four times a day on the day prior to your eye treatment procedure at [facility] and for 1 day after the eye procedure. Repeat this monthly x 4 more treatments.      Marland Kitchen VITAMIN E PO Take 1 tablet by mouth daily.       No current facility-administered medications for this visit.   Facility-Administered Medications Ordered in Other Visits  Medication Dose Route Frequency Provider Last Rate Last Dose  . 0.9 %  sodium chloride infusion   Intravenous Continuous Thompson Grayer, MD      . chlorhexidine (HIBICLENS)  4 % liquid 4 application  60 mL Topical Once Thompson Grayer, MD        Physical Exam: Filed Vitals:   05/13/13 0922  BP: 144/90  Pulse: 73  Height: 5' 7.5" (1.715 m)  Weight: 170 lb (77.111 kg)    GEN- The patient is well appearing, alert and oriented x 3 today.   Head- normocephalic, atraumatic Eyes-  Sclera clear, conjunctiva pink Ears- hearing intact Oropharynx- clear Lungs- Clear to ausculation bilaterally, normal work of breathing Chest- pacemaker pocket is well healed Heart- Regular rate and rhythm, no murmurs, rubs or gallops, PMI not laterally displaced GI- soft, NT, ND, + BS Extremities- no clubbing, cyanosis, or edema  Loop  recorder interrogation- reviewed in detail today,  See PACEART report  Assessment and Plan:  1. Syncope The patient has symptomatic sick sinus syndrome with pauses > 4 seconds and syncope. No reversible causes are identified.  I would therefore recommend pacemaker implantation at this time.  Risks, benefits, alternatives to pacemaker implantation were discussed in detail with the patient today. The patient understands that the risks include but are not limited to bleeding, infection, pneumothorax, perforation, tamponade, vascular damage, renal failure, MI, stroke, death,  and lead dislodgement and wishes to proceed. We will therefore schedule the procedure at the next available time.  She does not wish to have this done this week as I have advised but is willing to consider next week.  If she has syncope then she may be more willing to proceed sooner and should call 911.  No driving (pt aware).

## 2013-05-17 ENCOUNTER — Encounter (HOSPITAL_COMMUNITY): Payer: Self-pay | Admitting: Pharmacy Technician

## 2013-05-18 ENCOUNTER — Encounter: Payer: Self-pay | Admitting: Internal Medicine

## 2013-05-19 MED ORDER — CEFAZOLIN SODIUM-DEXTROSE 2-3 GM-% IV SOLR
2.0000 g | INTRAVENOUS | Status: DC
  Filled 2013-05-19: qty 50

## 2013-05-19 MED ORDER — MUPIROCIN 2 % EX OINT
TOPICAL_OINTMENT | Freq: Two times a day (BID) | CUTANEOUS | Status: DC
  Administered 2013-05-20: 1 via NASAL
  Filled 2013-05-19 (×2): qty 22

## 2013-05-19 MED ORDER — SODIUM CHLORIDE 0.9 % IR SOLN
80.0000 mg | Status: DC
  Filled 2013-05-19: qty 2

## 2013-05-19 MED ORDER — CHLORHEXIDINE GLUCONATE 4 % EX LIQD
60.0000 mL | Freq: Once | CUTANEOUS | Status: DC
  Filled 2013-05-19: qty 60

## 2013-05-20 ENCOUNTER — Ambulatory Visit (HOSPITAL_COMMUNITY)
Admission: RE | Admit: 2013-05-20 | Discharge: 2013-05-21 | Disposition: A | Discharge status: Home / Self Care | Payer: Medicare HMO | Source: Ambulatory Visit | Attending: Internal Medicine | Admitting: Internal Medicine

## 2013-05-20 ENCOUNTER — Encounter (HOSPITAL_COMMUNITY): Payer: Self-pay | Admitting: *Deleted

## 2013-05-20 ENCOUNTER — Encounter (HOSPITAL_COMMUNITY)
Admission: RE | Disposition: A | Discharge status: Home / Self Care | Payer: Self-pay | Source: Ambulatory Visit | Attending: Internal Medicine

## 2013-05-20 DIAGNOSIS — R55 Syncope and collapse: Secondary | ICD-10-CM | POA: Diagnosis present

## 2013-05-20 DIAGNOSIS — I495 Sick sinus syndrome: Secondary | ICD-10-CM

## 2013-05-20 DIAGNOSIS — Z7982 Long term (current) use of aspirin: Secondary | ICD-10-CM | POA: Insufficient documentation

## 2013-05-20 DIAGNOSIS — E039 Hypothyroidism, unspecified: Secondary | ICD-10-CM | POA: Insufficient documentation

## 2013-05-20 DIAGNOSIS — E785 Hyperlipidemia, unspecified: Secondary | ICD-10-CM | POA: Insufficient documentation

## 2013-05-20 HISTORY — PX: PACEMAKER INSERTION: SHX728

## 2013-05-20 HISTORY — DX: Sick sinus syndrome: I49.5

## 2013-05-20 HISTORY — PX: PERMANENT PACEMAKER INSERTION: SHX5480

## 2013-05-20 LAB — SURGICAL PCR SCREEN
MRSA, PCR: NEGATIVE
STAPHYLOCOCCUS AUREUS: NEGATIVE

## 2013-05-20 SURGERY — PERMANENT PACEMAKER INSERTION
Anesthesia: LOCAL | Laterality: Left

## 2013-05-20 MED ORDER — SODIUM CHLORIDE 0.9 % IJ SOLN: 3.0000 mL | Freq: Two times a day (BID) | INTRAMUSCULAR | Status: DC

## 2013-05-20 MED ORDER — CYCLOSPORINE 0.05 % OP EMUL
1.0000 [drp] | Freq: Two times a day (BID) | OPHTHALMIC | Status: DC
  Filled 2013-05-20 (×3): qty 1

## 2013-05-20 MED ORDER — SODIUM CHLORIDE 0.9 % IV SOLN: 250.0000 mL | INTRAVENOUS | Status: DC | PRN

## 2013-05-20 MED ORDER — CEFAZOLIN SODIUM 1-5 GM-% IV SOLN
1.0000 g | Freq: Four times a day (QID) | INTRAVENOUS | Status: DC
  Filled 2013-05-20 (×3): qty 50

## 2013-05-20 MED ORDER — TOBRAMYCIN 0.3 % OP SOLN: 1.0000 [drp] | OPHTHALMIC | Status: DC

## 2013-05-20 MED ORDER — LIDOCAINE HCL (PF) 1 % IJ SOLN
INTRAMUSCULAR | Status: AC
  Filled 2013-05-20: qty 30

## 2013-05-20 MED ORDER — SODIUM CHLORIDE 0.9 % IV SOLN
INTRAVENOUS | Status: DC
  Administered 2013-05-20: 12:00:00 via INTRAVENOUS

## 2013-05-20 MED ORDER — POTASSIUM CHLORIDE CRYS ER 10 MEQ PO TBCR
10.0000 meq | EXTENDED_RELEASE_TABLET | Freq: Two times a day (BID) | ORAL | Status: DC
  Administered 2013-05-20 – 2013-05-21 (×2): 10 meq via ORAL
  Filled 2013-05-20 (×3): qty 1

## 2013-05-20 MED ORDER — HEPARIN (PORCINE) IN NACL 2-0.9 UNIT/ML-% IJ SOLN
INTRAMUSCULAR | Status: AC
  Filled 2013-05-20: qty 500

## 2013-05-20 MED ORDER — TRAMADOL HCL 50 MG PO TABS: 50.0000 mg | ORAL_TABLET | Freq: Four times a day (QID) | ORAL | Status: DC | PRN

## 2013-05-20 MED ORDER — LEVOTHYROXINE SODIUM 88 MCG PO TABS
88.0000 ug | ORAL_TABLET | ORAL | Status: DC
  Administered 2013-05-21: 88 ug via ORAL
  Filled 2013-05-20 (×2): qty 1

## 2013-05-20 MED ORDER — POTASSIUM CITRATE ER 10 MEQ (1080 MG) PO TBCR
10.0000 meq | EXTENDED_RELEASE_TABLET | Freq: Two times a day (BID) | ORAL | Status: DC
  Filled 2013-05-20: qty 1

## 2013-05-20 MED ORDER — SODIUM CHLORIDE 0.9 % IJ SOLN: 3.0000 mL | INTRAMUSCULAR | Status: DC | PRN

## 2013-05-20 MED ORDER — ONDANSETRON HCL 4 MG/2ML IJ SOLN: 4.0000 mg | Freq: Four times a day (QID) | INTRAMUSCULAR | Status: DC | PRN

## 2013-05-20 MED ORDER — CEFAZOLIN SODIUM 1-5 GM-% IV SOLN
1.0000 g | Freq: Four times a day (QID) | INTRAVENOUS | Status: AC
  Administered 2013-05-20 – 2013-05-21 (×3): 1 g via INTRAVENOUS
  Filled 2013-05-20 (×3): qty 50

## 2013-05-20 NOTE — Op Note (Signed)
SURGEON:  Thompson Grayer, MD     PREPROCEDURE DIAGNOSIS:  Sick sinus syndrome and syncope    POSTPROCEDURE DIAGNOSIS:  Sick sinus syndrome and syncope     PROCEDURES:   1. Pacemaker implantation.   2. Implantable loop recorder removal    INTRODUCTION: Nancy Meadows is a 80 y.o. female  with a history of sick sinus syndrome and syncope who presents today for pacemaker implantation.  The patient reports intermittent episodes of dizziness over the past few months.  Implantable loop recorder placement has revealed multiple pauses > 4 seconds as the cause.  No reversible causes have been identified.  The patient therefore presents today for pacemaker implantation.     DESCRIPTION OF PROCEDURE:  Informed written consent was obtained, and the patient was brought to the electrophysiology lab in a fasting state.  The patient required no sedation for the procedure today.  The patients left chest was prepped and draped in the usual sterile fashion by the EP lab staff. The skin overlying the left deltopectoral region was infiltrated with lidocaine for local analgesia.  A 4-cm incision was made over the left deltopectoral region.  A left subcutaneous pacemaker pocket was fashioned using a combination of sharp and blunt dissection. Electrocautery was required to assure hemostasis.    RA/RV Lead Placement: The left axillary vein was therefore cannulated.  Through the left axillary vein, a Medtronic model [phone] (serial number [id number]) right atrial lead and a Medtronic model 5465- 58 (serial number [id number]) right ventricular lead were advanced with fluoroscopic visualization into the right atrial appendage and right ventricular apex positions respectively.  Initial atrial lead P- waves measured 3 mV with impedance of 437 ohms and a threshold of 0.7 V at 0.5 msec.  Right ventricular lead R-waves measured 10.5 mV with an impedance of 587 ohms and a threshold of 0.4 V at 0.5 msec.  Both leads were secured to  the pectoralis fascia using #2-0 silk over the suture sleeves.   Device Placement:  The leads were then connected to a Medtronic Adapta L model ADDRL 1 (serial number [id number]) pacemaker.  The pocket was irrigated with copious gentamicin solution.  The pacemaker was then placed into the pocket.  The pocket was then closed in 2 layers with 2.0 Vicryl suture for the subcutaneous and subcuticular layers.  Steri- Strips and a sterile dressing were then applied.  There were no early apparent complications.  No contrast was required for the procedure today.  Implantable loop recorder removal: The skin overlying the previously implanted medtronic LINQ [id number] ILR placed 12/31/12 was prepped and draped in the usual sterile fashion. The skin overlying the device was infiltrated with lidocaine for local analgesia. A 0.5-cm incision was made over the device. The device was easily removed from the pocket. The steristrips were placed over the incision for closure.   CONCLUSIONS:  1. Successful implantation of a Medtronic Adapta L dual-chamber pacemaker for sick sinus syndrome and syncope 2. Successful removal of a previously implanted MDT LINQ implantable monitor  3. No early apparent complications.        Thompson Grayer, MD 05/20/2013 2:20 PM

## 2013-05-20 NOTE — H&P (View-Only) (Signed)
PCP: KOHUT,WALTER DENNIS, MD Primary Cardiologist: [place]  Nancy Meadows is a 80 y.o. female who presents today for routine electrophysiology followup.  Since last being seen in our clinic, the patient reports doing reasonably well. Her ILR was implanted 12/2012 for evaluation of syncope.  Her ILR has demonstrated multiple pauses associated with pre-syncope.  She has been offered pacemaker implantation in the past which she has previously declined.    Today, she denies symptoms of palpitations, chest pain, shortness of breath,  lower extremity edema,  or syncope.  She continues to have dizziness. The patient is otherwise without complaint today.   Past Medical History  Diagnosis Date  . HLD (hyperlipidemia)   . Hypothyroid   . Goiter   . Cholelithiasis   . Arthritis     ddd-- occ. pain  . Eczema   . Lesion of bladder     hematuria/ frequency  . Hx of cardiovascular stress test     a. ETT-MV 7/14:  Low risk, ant defect c/w soft tissue atten., no ischemia, EF 77%  . SAH (subarachnoid hemorrhage)   . SDH (subdural hematoma)   . Syncope     pauses of >4 seconds   Past Surgical History  Procedure Laterality Date  . Appendectomy  age 10  . Abdominal hysterectomy  age 32  . Bilateral salpingoophectomy  age 52  . Gallstones removed  age 67  . Cataract extraction w/ intraocular lens  implant, bilateral  2012  . Cystoscopy with biopsy  11/21/2010    Procedure: CYSTOSCOPY WITH BIOPSY;  Surgeon: Daniel Young Woodruff, MD;  Location: [facility];  Service: Urology;  Laterality: N/A;  CYSTOSCOPY WITH BLADDER BIOPSY  AND INSTILLATION OF INDIGO CARMINE  . Cystoscopy/retrograde/ureteroscopy  11/21/2010    Procedure: CYSTOSCOPY/RETROGRADE/URETEROSCOPY;  Surgeon: Daniel Young Woodruff, MD;  Location: [facility];  Service: Urology;  Laterality: Left;    Current Outpatient Prescriptions  Medication Sig Dispense Refill  . aspirin 325 MG tablet Take 1/2 ASA 325 mg as  needed      . beta carotene w/minerals (OCUVITE) tablet Take 1 tablet by mouth daily.      . betamethasone valerate ointment (VALISONE) 0.1 % Apply 1 application topically daily as needed (eczema).       . cycloSPORINE (RESTASIS) 0.05 % ophthalmic emulsion Place 1 drop into both eyes daily.      . levothyroxine (SYNTHROID, LEVOTHROID) 88 MCG tablet Take 88 mcg by mouth every morning.       . Multiple Vitamin (MULTIVITAMIN WITH MINERALS) TABS tablet Take 1 tablet by mouth daily.      . potassium citrate (UROCIT-K) 10 MEQ (1080 MG) SR tablet Take 10 mEq by mouth 2 (two) times daily.      . tobramycin (TOBREX) 0.3 % ophthalmic solution Place 1 drop into the right eye See admin instructions. Once monthly, administer 1 drop into right eye four times a day on the day prior to your eye treatment procedure at [facility] and for 1 day after the eye procedure. Repeat this monthly x 4 more treatments.      . VITAMIN E PO Take 1 tablet by mouth daily.       No current facility-administered medications for this visit.   Facility-Administered Medications Ordered in Other Visits  Medication Dose Route Frequency Provider Last Rate Last Dose  . 0.9 %  sodium chloride infusion   Intravenous Continuous Maayan Jenning, MD      . chlorhexidine (HIBICLENS)   4 % liquid 4 application  60 mL Topical Once Ariyah Sedlack, MD        Physical Exam: Filed Vitals:   05/13/13 0922  BP: 144/90  Pulse: 73  Height: 5' 7.5" (1.715 m)  Weight: 170 lb (77.111 kg)    GEN- The patient is well appearing, alert and oriented x 3 today.   Head- normocephalic, atraumatic Eyes-  Sclera clear, conjunctiva pink Ears- hearing intact Oropharynx- clear Lungs- Clear to ausculation bilaterally, normal work of breathing Chest- pacemaker pocket is well healed Heart- Regular rate and rhythm, no murmurs, rubs or gallops, PMI not laterally displaced GI- soft, NT, ND, + BS Extremities- no clubbing, cyanosis, or edema  Loop  recorder interrogation- reviewed in detail today,  See PACEART report  Assessment and Plan:  1. Syncope The patient has symptomatic sick sinus syndrome with pauses > 4 seconds and syncope. No reversible causes are identified.  I would therefore recommend pacemaker implantation at this time.  Risks, benefits, alternatives to pacemaker implantation were discussed in detail with the patient today. The patient understands that the risks include but are not limited to bleeding, infection, pneumothorax, perforation, tamponade, vascular damage, renal failure, MI, stroke, death,  and lead dislodgement and wishes to proceed. We will therefore schedule the procedure at the next available time.  She does not wish to have this done this week as I have advised but is willing to consider next week.  If she has syncope then she may be more willing to proceed sooner and should call 911.  No driving (pt aware).    

## 2013-05-20 NOTE — Interval H&P Note (Signed)
History and Physical Interval Note:  05/20/2013 12:27 PM  Nancy Meadows  has presented today for surgery, with the diagnosis of Syncope  The various methods of treatment have been discussed with the patient and family. After consideration of risks, benefits and other options for treatment, the patient has consented to  Procedure(s): PERMANENT PACEMAKER INSERTION (Left) and implantable loop recorder removal as a surgical intervention .  The patient's history has been reviewed, patient examined, no change in status, stable for surgery.  I have reviewed the patient's chart and labs.  Questions were answered to the patient's satisfaction.     Thompson Grayer

## 2013-05-21 ENCOUNTER — Encounter (HOSPITAL_COMMUNITY): Payer: Self-pay | Admitting: *Deleted

## 2013-05-21 ENCOUNTER — Telehealth: Payer: Self-pay | Admitting: Cardiology

## 2013-05-21 ENCOUNTER — Ambulatory Visit (HOSPITAL_COMMUNITY): Payer: Medicare HMO

## 2013-05-21 DIAGNOSIS — R55 Syncope and collapse: Secondary | ICD-10-CM

## 2013-05-21 DIAGNOSIS — I495 Sick sinus syndrome: Secondary | ICD-10-CM

## 2013-05-21 NOTE — Progress Notes (Signed)
Patient given discharge instructions.  Patient understood the pacemaker guidelines.  Patient was cooperative, calm, and stable.  No signs and symptoms of distress.  Dicharged home.

## 2013-05-21 NOTE — Discharge Instructions (Signed)
° ° °  Supplemental Discharge Instructions for  Pacemaker/Defibrillator Patients  Activity No heavy lifting or vigorous activity with your left/right arm for 6 to 8 weeks.  Do not raise your left/right arm above your head for one week.  Gradually raise your affected arm as drawn below.             5/10                      5/11                          5/12                           5/13 __  NO DRIVING for   i week  ; you may begin driving on 0/92/95 .  WOUND CARE   Keep the wound area clean and dry.  Do not get this area wet for one week. No showers for one week; you may shower on     .   The tape/steri-strips on your wound will fall off; do not pull them off.  No bandage is needed on the site.  DO  NOT apply any creams, oils, or ointments to the wound area.   If you notice any drainage or discharge from the wound, any swelling or bruising at the site, or you develop a fever > 101? F after you are discharged home, call the office at once.  Special Instructions   You are still able to use cellular telephones; use the ear opposite the side where you have your pacemaker/defibrillator.  Avoid carrying your cellular phone near your device.   When traveling through airports, show security personnel your identification card to avoid being screened in the metal detectors.  Ask the security personnel to use the hand wand.   Avoid arc welding equipment, MRI testing (magnetic resonance imaging), TENS units (transcutaneous nerve stimulators).  Call the office for questions about other devices.   Avoid electrical appliances that are in poor condition or are not properly grounded.   Microwave ovens are safe to be near or to operate.  Additional information for defibrillator patients should your device go off:   If your device goes off ONCE and you feel fine afterward, notify the device clinic nurses.   If your device goes off ONCE and you do not feel well afterward, call 911.   If your device goes off  TWICE, call 911.   If your device goes off THREE times in one day, call 911.  DO NOT DRIVE YOURSELF OR A FAMILY MEMBER WITH A DEFIBRILLATOR TO THE HOSPITAL--CALL 911.

## 2013-05-21 NOTE — Discharge Summary (Signed)
ELECTROPHYSIOLOGY PROCEDURE DISCHARGE SUMMARY    Patient ID: Nancy Meadows,  MRN: [id number], DOB/AGE: July 10, 1932 80 y.o.  Admit date: 05/20/2013 Discharge date: 05/21/2013  Primary Care Physician: [name], MD Primary Cardiologist: Stanford Breed Electrophysiologist: Mariafernanda Hendricksen  Primary Discharge Diagnosis:  Sick sinus syndrome and syncope status post pacemaker implantation and loop removal this admission  Secondary Discharge Diagnosis:  1.  Hyperlipidemia 2.  Hypothyroidism 3.  Prior subdural hematoma  Allergies  Allergen Reactions  . Acetaminophen Other (See Comments)    Syncope  . Meclizine Other (See Comments)    Increased dizziness and confusion     Procedures This Admission:  1.  Implantation of MDT dual chamber pacemaker and removal of previously implanted ILR by Dr Rayann Heman on 05-20-2013.  The patient received a MDT ADDRL1 pacemaker with model number 7615 right atrial lead and model number 1834 right ventricular lead.  There were no early apparent complications.  2.  CXR on 05-21-2013 demonstrated stable lead placement and no ptx following device implantation.   Brief HPI: Nancy Meadows is a 80 y.o. female with a history of syncope.  She underwent ILR implantation in December of 2014 and was found to have sick sinus syndrome and pauses of >4 seconds.  Risks, benefits, and alternatives to pacemaker implantation were reviewed with the patient who wished to proceed.  Hospital Course:  The patient was admitted and underwent implantation of a MDT dual chamber pacemaker with details as outlined above.   She was monitored on telemetry overnight which demonstrated sinus rhythm with occasional atrial pacing.  Left chest was without hematoma or ecchymosis.  The device was interrogated and found to be functioning normally.  CXR was obtained and demonstrated no pneumothorax status post device implantation.  Wound care, arm mobility, and restrictions were reviewed with the patient.   Dr Rayann Heman examined the patient and considered them stable for discharge to home.    Discharge Vitals: Blood pressure 105/50, pulse 69, temperature 98.5 F (36.9 C), temperature source Oral, resp. rate 18, height 5' 7.5" (1.715 m), weight 171 lb 8 oz (77.792 kg), SpO2 96.00%.   Physical Exam: Filed Vitals:   05/20/13 1830 05/20/13 1948 05/21/13 0000 05/21/13 0618  BP: 121/72 128/71 99/66 105/50  Pulse: 72 74 67 69  Temp:  98.3 F (36.8 C) 98.6 F (37 C) 98.5 F (36.9 C)  TempSrc:  Oral Oral Oral  Resp:  _0 Height:      Weight:    171 lb 8 oz (77.792 kg)  SpO2: 100% 99% 98% 96%    GEN- The patient is well appearing, alert and oriented x 3 today.   Head- normocephalic, atraumatic Eyes-  Sclera clear, conjunctiva pink Ears- hearing intact Oropharynx- clear Neck- supple,  Lungs- Clear to ausculation bilaterally, normal work of breathing Heart- Regular rate and rhythm, no murmurs, rubs or gallops, PMI not laterally displaced GI- soft, NT, ND, + BS Extremities- no clubbing, cyanosis, or edema Pacemaker site is without hematoma ILR site with minor bleeding on his dressing  Labs:   Lab Results  Component Value Date   WBC 7.2 05/13/2013   HGB 11.7* 05/13/2013   HCT 35.4* 05/13/2013   MCV 95.8 05/13/2013   PLT 153.0 05/13/2013   No results found for this basename: NA, K, CL, CO2, BUN, CREATININE, CALCIUM, LABALBU, PROT, BILITOT, ALKPHOS, ALT, AST, GLUCOSE,  in the last 168 hours   Discharge Medications:    Medication List    ASK your  doctor about these medications       aspirin 325 MG tablet  Take 162.5 mg by mouth daily.     beta carotene w/minerals tablet  Take 1 tablet by mouth daily.     betamethasone valerate ointment 0.1 %  Commonly known as:  VALISONE  Apply 1 application topically daily as needed (eczema).     cycloSPORINE 0.05 % ophthalmic emulsion  Commonly known as:  RESTASIS  Place 1 drop into both eyes 2 (two) times daily.     levothyroxine 88  MCG tablet  Commonly known as:  SYNTHROID, LEVOTHROID  Take 88 mcg by mouth every morning.     multivitamin with minerals Tabs tablet  Take 1 tablet by mouth daily.     potassium citrate 10 MEQ (1080 MG) SR tablet  Commonly known as:  UROCIT-K  Take 10 mEq by mouth 2 (two) times daily.     tobramycin 0.3 % ophthalmic solution  Commonly known as:  TOBREX  Place 1 drop into the right eye See admin instructions. Once monthly, administer 1 drop into right eye four times a day on the day prior to your eye treatment procedure at [facility] and for 1 day after the eye procedure. Repeat this monthly x 4 more treatments.     VITAMIN E PO  Take 1 tablet by mouth daily.        Disposition:       Future Appointments Provider Department Dept Phone   06/02/2013 4:00 PM [facility] [place] [facility] [phone]   09/02/2013 10:00 AM Thompson Grayer, MD [facility] [phone]       Duration of Discharge Encounter: Greater than 30 minutes including physician time.  Signed,  Thompson Grayer MD

## 2013-06-02 ENCOUNTER — Encounter: Payer: Self-pay | Admitting: Internal Medicine

## 2013-06-02 ENCOUNTER — Ambulatory Visit (INDEPENDENT_AMBULATORY_CARE_PROVIDER_SITE_OTHER): Payer: Medicare HMO | Admitting: *Deleted

## 2013-06-02 DIAGNOSIS — Z950 Presence of cardiac pacemaker: Secondary | ICD-10-CM

## 2013-06-02 DIAGNOSIS — I495 Sick sinus syndrome: Secondary | ICD-10-CM

## 2013-06-02 LAB — MDC_IDC_ENUM_SESS_TYPE_INCLINIC
Battery Impedance: 100 Ohm
Battery Remaining Longevity: 164 mo
Battery Voltage: 2.8 V
Brady Statistic AP VP Percent: 0 %
Brady Statistic AP VS Percent: 7 %
Brady Statistic AS VP Percent: 1 %
Date Time Interrogation Session: 20150520181429
Lead Channel Impedance Value: 559 Ohm
Lead Channel Pacing Threshold Amplitude: 0.5 V
Lead Channel Pacing Threshold Amplitude: 0.75 V
Lead Channel Pacing Threshold Pulse Width: 0.4 ms
Lead Channel Sensing Intrinsic Amplitude: 2.8 mV
Lead Channel Setting Pacing Amplitude: 3.5 V
Lead Channel Setting Sensing Sensitivity: 4 mV
MDC IDC MSMT LEADCHNL RV IMPEDANCE VALUE: 663 Ohm
MDC IDC MSMT LEADCHNL RV PACING THRESHOLD PULSEWIDTH: 0.4 ms
MDC IDC MSMT LEADCHNL RV SENSING INTR AMPL: 11.2 mV
MDC IDC SET LEADCHNL RA PACING AMPLITUDE: 3.5 V
MDC IDC SET LEADCHNL RV PACING PULSEWIDTH: 0.4 ms
MDC IDC STAT BRADY AS VS PERCENT: 91 %

## 2013-06-02 NOTE — Progress Notes (Signed)
Wound check appointment. Steri-strips removed from pocket site & ILR explant site. Wounds without redness or edema. Incision edges approximated, wounds well healed. Normal device function. Thresholds, sensing, and impedances consistent with implant measurements. Device programmed at 3.5V/auto capture programmed on for extra safety margin until 3 month visit. Histogram distribution appropriate for patient and level of activity. 3 mode switches--- <0.1%. 2 VHRs---longest 6 beats, other was SVT. Patient educated about wound care, arm mobility, lifting restrictions. ROV w/ Dr. Rayann Heman 09/02/13.

## 2013-06-08 ENCOUNTER — Encounter: Payer: Self-pay | Admitting: Cardiology

## 2013-06-23 ENCOUNTER — Encounter: Payer: Self-pay | Admitting: Gastroenterology

## 2013-07-02 ENCOUNTER — Ambulatory Visit (INDEPENDENT_AMBULATORY_CARE_PROVIDER_SITE_OTHER): Payer: Medicare HMO | Admitting: Physician Assistant

## 2013-07-02 ENCOUNTER — Encounter: Payer: Self-pay | Admitting: Physician Assistant

## 2013-07-02 VITALS — BP 130/85 | HR 84 | Ht 67.5 in | Wt 169.8 lb

## 2013-07-02 DIAGNOSIS — Z950 Presence of cardiac pacemaker: Secondary | ICD-10-CM

## 2013-07-02 DIAGNOSIS — I495 Sick sinus syndrome: Secondary | ICD-10-CM

## 2013-07-02 NOTE — Progress Notes (Signed)
   Cardiology Office Note    Date:  07/02/2013   ID:  Nancy Meadows, DOB December 20, 1932, MRN [id number]  PCP:  [name], MD  Cardiologist:  Dr. Kirk Ruths   Electrophysiologist:  Dr. Thompson Grayer    History of Present Illness: Nancy Meadows is a 80 y.o. female with a history of a explained syncope (fall resulting in subdural and subarachnoid hemorrhage in 2014), hypothyroidism, HL. She underwent implantation of an implantable loop recorder device.  This demonstrated evidence of sick sinus syndrome and symptomatically pauses of >4 seconds. Pacemaker implantation was recommended. Patient initially declined. She eventually agreed to proceed and underwent implantation of a MDT dual-chamber pacemaker with Dr. Rayann Heman 05/20/13.  ILR was explanted.   She has been seen in the EP clinic for a wound check after her device implant and she already has an appointment with Dr. Thompson Grayer.  She presents for follow up for unclear reasons.  She is doing well.  States she feels better.  Has some occasional pain over her pacer site.  This is overall getting better.  No erythema or fever.  She thinks that her ILR is still implanted.  I reviewed the op note and it appears that it was successfully removed.  No chest pain, dyspnea, syncope.  Pacer site looks good without erythema or discharge.  There is a slightly hypertrophic scar.  No changes are made today and she should follow up with Dr. Thompson Grayer as planned.     Studies:  - Echo (02/10/12):  EF 55-65%, mild MR  - Nuclear (07/14/12):  No ischemia, soft tissue attenuation, EF 77%, low risk  - Carotid US (02/09/12):  No significant ICA stenosis   Signed, Versie Starks, MHS 07/02/2013 10:23 AM    [facility] [address], [place], [place]  [address] Phone: [phone]; Fax: [phone]

## 2013-07-02 NOTE — Patient Instructions (Signed)
FOLLOW UP WITH DR. ALLRED AS PLANNED  NO CHANGES WERE MADE TODAY

## 2013-07-06 LAB — MDC_IDC_ENUM_SESS_TYPE_REMOTE
MDC IDC SESS DTM: 20150412233246
MDC IDC SET ZONE DETECTION INTERVAL: 2000 ms
MDC IDC SET ZONE DETECTION INTERVAL: 3000 ms
Zone Setting Detection Interval: 390 ms

## 2013-07-20 ENCOUNTER — Telehealth: Payer: Self-pay | Admitting: Internal Medicine

## 2013-07-20 NOTE — Telephone Encounter (Signed)
New Message  Pt called states that she is feeling pain around the pacer site.. She reports that it is getting tender by the day.. She also explains that she is feeling needle feelings near the pacer site. Pt requests a call back to discuss.

## 2013-07-21 ENCOUNTER — Ambulatory Visit (INDEPENDENT_AMBULATORY_CARE_PROVIDER_SITE_OTHER): Payer: Medicare HMO | Admitting: *Deleted

## 2013-07-21 DIAGNOSIS — I495 Sick sinus syndrome: Secondary | ICD-10-CM

## 2013-07-21 NOTE — Telephone Encounter (Signed)
Pt calling to speak with Claiborne Billings or Dr Rayann Heman about her pacemaker site.  Pt states her pacemaker site is still sore, especially when she moves around.  Pt had this placed on 05/20/13.  Pt states there is no drainage, redness, or s/s of infection at the site.  Pt states that there is a significant scar noted, but that's been there.  Pt states its just continuously sore and at times feels like tiny needles pricking at the site.  Pt denies any other issues like cp and sob.  Informed pt that Claiborne Billings is out of the office and Dr Rayann Heman is in clinic.  Informed pt that I will route this message to both of them for further review and recommendation, and then we will follow-up with pt.  Pt verbalized understanding and agrees with this plan.

## 2013-07-21 NOTE — Telephone Encounter (Signed)
F/u    Pt calling about previous message. Please call pt

## 2013-07-21 NOTE — Telephone Encounter (Signed)
Spoke with patient and scheduled her an appointment for today @ 2:30pm for a wound check.

## 2013-07-21 NOTE — Progress Notes (Signed)
The patient was seen today in the device clinic for c/o soreness at her PPM site.  The site is well healed without redness or edema.  Dr. Rayann Heman also checked the site today.  We will follow up as scheduled.

## 2013-07-26 ENCOUNTER — Encounter: Payer: Self-pay | Admitting: Internal Medicine

## 2013-08-05 ENCOUNTER — Encounter: Payer: Self-pay | Admitting: Internal Medicine

## 2013-09-02 ENCOUNTER — Encounter: Payer: Self-pay | Admitting: Internal Medicine

## 2013-09-02 ENCOUNTER — Ambulatory Visit (INDEPENDENT_AMBULATORY_CARE_PROVIDER_SITE_OTHER): Payer: Medicare HMO | Admitting: Internal Medicine

## 2013-09-02 VITALS — BP 120/76 | HR 73 | Ht 67.0 in | Wt 168.0 lb

## 2013-09-02 DIAGNOSIS — I459 Conduction disorder, unspecified: Secondary | ICD-10-CM

## 2013-09-02 DIAGNOSIS — Z950 Presence of cardiac pacemaker: Secondary | ICD-10-CM

## 2013-09-02 DIAGNOSIS — I495 Sick sinus syndrome: Secondary | ICD-10-CM

## 2013-09-02 LAB — MDC_IDC_ENUM_SESS_TYPE_INCLINIC
Battery Impedance: 100 Ohm
Battery Remaining Longevity: 176 mo
Battery Voltage: 2.8 V
Brady Statistic AS VP Percent: 0 %
Brady Statistic AS VS Percent: 92 %
Date Time Interrogation Session: 20150820133455
Lead Channel Impedance Value: 529 Ohm
Lead Channel Pacing Threshold Amplitude: 0.5 V
Lead Channel Pacing Threshold Pulse Width: 0.4 ms
Lead Channel Pacing Threshold Pulse Width: 0.4 ms
Lead Channel Setting Pacing Amplitude: 2 V
Lead Channel Setting Pacing Pulse Width: 0.4 ms
MDC IDC MSMT LEADCHNL RA SENSING INTR AMPL: 2.8 mV
MDC IDC MSMT LEADCHNL RV IMPEDANCE VALUE: 663 Ohm
MDC IDC MSMT LEADCHNL RV PACING THRESHOLD AMPLITUDE: 0.75 V
MDC IDC MSMT LEADCHNL RV SENSING INTR AMPL: 15.67 mV
MDC IDC SET LEADCHNL RA PACING AMPLITUDE: 1.5 V
MDC IDC SET LEADCHNL RV SENSING SENSITIVITY: 4 mV
MDC IDC STAT BRADY AP VP PERCENT: 0 %
MDC IDC STAT BRADY AP VS PERCENT: 8 %

## 2013-09-02 NOTE — Patient Instructions (Signed)
Remote monitoring is used to monitor your Pacemaker of ICD from home. This monitoring reduces the number of office visits required to check your device to one time per year. It allows [place] to keep an eye on the functioning of your device to ensure it is working properly. You are scheduled for a device check from home on 12/02/2013. You may send your transmission at any time that day. If you have a wireless device, the transmission will be sent automatically. After your physician reviews your transmission, you will receive a postcard with your next transmission date.  Your physician wants you to follow-up in: [place].  You will receive a reminder letter in the mail two months in advance. If you don't receive a letter, please call our office to schedule the follow-up appointment.

## 2013-09-02 NOTE — Progress Notes (Signed)
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 female who presents today for routine electrophysiology followup.  Since her recent ppm implant, the patient reports doing very well.  Her energy is improved.  She has had no further dizziness or presyncope.  Today, she denies symptoms of palpitations, chest pain, shortness of breath,  lower extremity edema, dizziness, presyncope, or syncope.  The patient is otherwise without complaint today.   Past Medical History  Diagnosis Date  . HLD (hyperlipidemia)   . Hypothyroid   . Goiter   . Cholelithiasis   . Arthritis     ddd-- occ. pain  . Eczema   . Lesion of bladder     hematuria/ frequency  . Hx of cardiovascular stress test     a. ETT-MV 7/14:  Low risk, ant defect c/w soft tissue atten., no ischemia, EF 77%  . SAH (subarachnoid hemorrhage)   . SDH (subdural hematoma)   . Syncope     pauses of >4 seconds  . Sick sinus syndrome    Past Surgical History  Procedure Laterality Date  . Appendectomy  age 53  . Abdominal hysterectomy  age 80  . Bilateral salpingoophectomy  age 77  . Gallstones removed  age 17  . Cataract extraction w/ intraocular lens  implant, bilateral  2012  . Cystoscopy with biopsy  11/21/2010    Procedure: CYSTOSCOPY WITH BIOPSY;  Surgeon: Molli Hazard, MD;  Location: [facility];  Service: Urology;  Laterality: N/A;  CYSTOSCOPY WITH BLADDER BIOPSY  AND INSTILLATION OF INDIGO CARMINE  . Cystoscopy/retrograde/ureteroscopy  11/21/2010    Procedure: CYSTOSCOPY/RETROGRADE/URETEROSCOPY;  Surgeon: Molli Hazard, MD;  Location: [facility];  Service: Urology;  Laterality: Left;  . Pacemaker insertion  05-20-2013    MDT ADDRL1 pacemaker implanted by Dr Rayann Heman for SSS and symptomatic bradycardia    ROS- all systems are reviewed and negative except as per HPI above  Current Outpatient Prescriptions  Medication Sig Dispense Refill  .  aspirin 81 MG tablet Take 81 mg by mouth daily.      . beta carotene w/minerals (OCUVITE) tablet Take 1 tablet by mouth daily.      . betamethasone valerate ointment (VALISONE) 0.1 % Apply 1 application topically daily as needed (eczema).       . cycloSPORINE (RESTASIS) 0.05 % ophthalmic emulsion Place 1 drop into both eyes 2 (two) times daily.       Marland Kitchen levothyroxine (SYNTHROID, LEVOTHROID) 88 MCG tablet Take 88 mcg by mouth every morning.       . Multiple Vitamin (MULTIVITAMIN WITH MINERALS) TABS tablet Take 1 tablet by mouth daily.      . potassium citrate (UROCIT-K) 10 MEQ (1080 MG) SR tablet Take 10 mEq by mouth 2 (two) times daily.      Marland Kitchen tobramycin (TOBREX) 0.3 % ophthalmic solution Place 1 drop into the right eye See admin instructions. Once monthly, administer 1 drop into right eye four times a day on the day prior to your eye treatment procedure at [facility] and for 1 day after the eye procedure. Repeat this monthly x 4 more treatments.      Marland Kitchen VITAMIN E PO Take 1 tablet by mouth daily.       No current facility-administered medications for this visit.   Facility-Administered Medications Ordered in Other Visits  Medication Dose Route Frequency Provider Last Rate Last Dose  . 0.9 %  sodium chloride infusion   Intravenous Continuous Thompson Grayer, MD      . chlorhexidine (HIBICLENS) 4 % liquid 4 application  60 mL Topical Once Thompson Grayer, MD        Physical Exam: Filed Vitals:   09/02/13 1012  BP: 120/76  Pulse: 73  Height: 5' 7" (1.702 m)  Weight: 168 lb (76.204 kg)    GEN- The patient is well appearing, alert and oriented x 3 today.   Head- normocephalic, atraumatic Eyes-  Sclera clear, conjunctiva pink Ears- hearing intact Oropharynx- clear Lungs- Clear to ausculation bilaterally, normal work of breathing Chest- pacemaker pocket is well healed Heart- Regular rate and rhythm, no murmurs, rubs or gallops, PMI not laterally displaced GI- soft, NT, ND, +  BS Extremities- no clubbing, cyanosis, or edema  Pacemaker interrogation- reviewed in detail today,  See PACEART report ekg today reveals sinus rhythm 70 BPM, PR 200, incomplete RBB  Assessment and Plan:  1. Sick sinus syndrome Normal pacemaker function See Pace Art report No changes today  2. Syncope Resolved with PPM  carelink Return to see me in 1 year followup with Dr Stanford Breed as scheduled

## 2013-09-07 ENCOUNTER — Encounter: Payer: Self-pay | Admitting: Internal Medicine

## 2013-12-02 ENCOUNTER — Ambulatory Visit (INDEPENDENT_AMBULATORY_CARE_PROVIDER_SITE_OTHER): Payer: Medicare HMO | Admitting: *Deleted

## 2013-12-02 DIAGNOSIS — I495 Sick sinus syndrome: Secondary | ICD-10-CM

## 2013-12-02 NOTE — Progress Notes (Signed)
Remote pacemaker transmission.   

## 2013-12-03 LAB — MDC_IDC_ENUM_SESS_TYPE_REMOTE
Battery Impedance: 100 Ohm
Battery Remaining Longevity: 176 mo
Brady Statistic AP VP Percent: 0 %
Brady Statistic AS VP Percent: 0 %
Brady Statistic AS VS Percent: 90 %
Lead Channel Impedance Value: 536 Ohm
Lead Channel Pacing Threshold Amplitude: 0.5 V
Lead Channel Pacing Threshold Pulse Width: 0.4 ms
Lead Channel Sensing Intrinsic Amplitude: 1.4 mV
Lead Channel Sensing Intrinsic Amplitude: 8 mV
Lead Channel Setting Pacing Amplitude: 1.5 V
Lead Channel Setting Pacing Pulse Width: 0.4 ms
MDC IDC MSMT BATTERY VOLTAGE: 2.8 V
MDC IDC MSMT LEADCHNL RA PACING THRESHOLD AMPLITUDE: 0.5 V
MDC IDC MSMT LEADCHNL RV IMPEDANCE VALUE: 644 Ohm
MDC IDC MSMT LEADCHNL RV PACING THRESHOLD PULSEWIDTH: 0.4 ms
MDC IDC SESS DTM: 20151119142749
MDC IDC SET LEADCHNL RV PACING AMPLITUDE: 2 V
MDC IDC SET LEADCHNL RV SENSING SENSITIVITY: 4 mV
MDC IDC STAT BRADY AP VS PERCENT: 10 %

## 2013-12-15 ENCOUNTER — Encounter: Payer: Self-pay | Admitting: Cardiology

## 2013-12-16 ENCOUNTER — Other Ambulatory Visit: Payer: Self-pay

## 2013-12-20 ENCOUNTER — Encounter: Payer: Self-pay | Admitting: Internal Medicine

## 2013-12-23 ENCOUNTER — Encounter (HOSPITAL_COMMUNITY): Payer: Self-pay | Admitting: Internal Medicine

## 2014-02-09 ENCOUNTER — Other Ambulatory Visit: Payer: Self-pay

## 2014-02-09 DIAGNOSIS — Z1231 Encounter for screening mammogram for malignant neoplasm of breast: Secondary | ICD-10-CM

## 2014-02-22 ENCOUNTER — Telehealth: Payer: Self-pay | Admitting: Internal Medicine

## 2014-02-22 NOTE — Telephone Encounter (Signed)
Walk in pt form " [employer] [place]" paper dropped off sent to [employer]

## 2014-03-01 ENCOUNTER — Ambulatory Visit
Admission: RE | Admit: 2014-03-01 | Discharge: 2014-03-01 | Disposition: A | Discharge status: Home / Self Care | Payer: Medicare HMO | Source: Ambulatory Visit

## 2014-03-01 DIAGNOSIS — Z1231 Encounter for screening mammogram for malignant neoplasm of breast: Secondary | ICD-10-CM

## 2014-03-07 ENCOUNTER — Ambulatory Visit (INDEPENDENT_AMBULATORY_CARE_PROVIDER_SITE_OTHER): Payer: Medicare HMO | Admitting: *Deleted

## 2014-03-07 DIAGNOSIS — I495 Sick sinus syndrome: Secondary | ICD-10-CM

## 2014-03-07 NOTE — Progress Notes (Signed)
Remote pacemaker transmission.

## 2014-03-09 LAB — MDC_IDC_ENUM_SESS_TYPE_REMOTE
Battery Impedance: 100 Ohm
Battery Remaining Longevity: 174 mo
Battery Voltage: 2.8 V
Brady Statistic AS VP Percent: 0 %
Brady Statistic AS VS Percent: 89 %
Date Time Interrogation Session: 20160222155121
Lead Channel Pacing Threshold Amplitude: 0.5 V
Lead Channel Pacing Threshold Pulse Width: 0.4 ms
Lead Channel Sensing Intrinsic Amplitude: 1.4 mV
Lead Channel Sensing Intrinsic Amplitude: 8 mV
Lead Channel Setting Pacing Amplitude: 1.5 V
Lead Channel Setting Pacing Pulse Width: 0.4 ms
MDC IDC MSMT LEADCHNL RA IMPEDANCE VALUE: 537 Ohm
MDC IDC MSMT LEADCHNL RA PACING THRESHOLD AMPLITUDE: 0.5 V
MDC IDC MSMT LEADCHNL RV IMPEDANCE VALUE: 600 Ohm
MDC IDC MSMT LEADCHNL RV PACING THRESHOLD PULSEWIDTH: 0.4 ms
MDC IDC SET LEADCHNL RV PACING AMPLITUDE: 2 V
MDC IDC SET LEADCHNL RV SENSING SENSITIVITY: 5.6 mV
MDC IDC STAT BRADY AP VP PERCENT: 0 %
MDC IDC STAT BRADY AP VS PERCENT: 11 %

## 2014-03-14 ENCOUNTER — Encounter: Payer: Self-pay | Admitting: Cardiology

## 2014-03-21 ENCOUNTER — Encounter: Payer: Self-pay | Admitting: Internal Medicine

## 2014-06-08 ENCOUNTER — Telehealth: Payer: Self-pay | Admitting: Cardiology

## 2014-06-08 ENCOUNTER — Encounter: Payer: Self-pay | Admitting: Internal Medicine

## 2014-06-08 ENCOUNTER — Ambulatory Visit (INDEPENDENT_AMBULATORY_CARE_PROVIDER_SITE_OTHER): Payer: Medicare HMO | Admitting: *Deleted

## 2014-06-08 DIAGNOSIS — I495 Sick sinus syndrome: Secondary | ICD-10-CM

## 2014-06-08 NOTE — Progress Notes (Signed)
Remote pacemaker transmission.

## 2014-06-08 NOTE — Telephone Encounter (Signed)
Spoke with pt and reminded pt of remote transmission that is due today. Pt verbalized understanding.   

## 2014-06-09 LAB — CUP PACEART REMOTE DEVICE CHECK
Battery Impedance: 100 Ohm
Battery Remaining Longevity: 175 mo
Battery Voltage: 2.8 V
Brady Statistic AP VP Percent: 0 %
Brady Statistic AP VS Percent: 11 %
Brady Statistic AS VS Percent: 89 %
Lead Channel Impedance Value: 529 Ohm
Lead Channel Impedance Value: 644 Ohm
Lead Channel Pacing Threshold Amplitude: 0.5 V
Lead Channel Pacing Threshold Amplitude: 0.625 V
Lead Channel Pacing Threshold Pulse Width: 0.4 ms
Lead Channel Sensing Intrinsic Amplitude: 1 mV
Lead Channel Sensing Intrinsic Amplitude: 8 mV
Lead Channel Setting Pacing Amplitude: 1.5 V
Lead Channel Setting Pacing Amplitude: 2 V
Lead Channel Setting Pacing Pulse Width: 0.4 ms
Lead Channel Setting Sensing Sensitivity: 4 mV
MDC IDC MSMT LEADCHNL RV PACING THRESHOLD PULSEWIDTH: 0.4 ms
MDC IDC SESS DTM: 20160525115000
MDC IDC STAT BRADY AS VP PERCENT: 0 %

## 2014-06-22 ENCOUNTER — Encounter: Payer: Self-pay | Admitting: Cardiology

## 2014-08-24 ENCOUNTER — Encounter: Payer: Self-pay | Admitting: *Deleted

## 2014-09-05 ENCOUNTER — Encounter: Payer: Self-pay | Admitting: Internal Medicine

## 2014-09-05 ENCOUNTER — Ambulatory Visit (INDEPENDENT_AMBULATORY_CARE_PROVIDER_SITE_OTHER): Payer: Medicare HMO | Admitting: Internal Medicine

## 2014-09-05 VITALS — BP 118/58 | HR 79 | Ht 67.0 in | Wt 159.4 lb

## 2014-09-05 DIAGNOSIS — I491 Atrial premature depolarization: Secondary | ICD-10-CM

## 2014-09-05 DIAGNOSIS — I495 Sick sinus syndrome: Secondary | ICD-10-CM

## 2014-09-05 LAB — CUP PACEART INCLINIC DEVICE CHECK
Battery Impedance: 112 Ohm
Battery Remaining Longevity: 168 mo
Brady Statistic AP VP Percent: 0 %
Brady Statistic AS VP Percent: 0 %
Brady Statistic AS VS Percent: 88 %
Date Time Interrogation Session: 20160822094024
Lead Channel Impedance Value: 522 Ohm
Lead Channel Pacing Threshold Amplitude: 0.5 V
Lead Channel Pacing Threshold Pulse Width: 0.4 ms
Lead Channel Pacing Threshold Pulse Width: 0.4 ms
Lead Channel Sensing Intrinsic Amplitude: 11.2 mV
Lead Channel Setting Pacing Amplitude: 2 V
Lead Channel Setting Pacing Amplitude: 2.5 V
Lead Channel Setting Pacing Pulse Width: 0.4 ms
MDC IDC MSMT BATTERY VOLTAGE: 2.8 V
MDC IDC MSMT LEADCHNL RA PACING THRESHOLD AMPLITUDE: 0.5 V
MDC IDC MSMT LEADCHNL RA SENSING INTR AMPL: 2 mV
MDC IDC MSMT LEADCHNL RV IMPEDANCE VALUE: 622 Ohm
MDC IDC SET LEADCHNL RV SENSING SENSITIVITY: 4 mV
MDC IDC STAT BRADY AP VS PERCENT: 11 %

## 2014-09-05 NOTE — Patient Instructions (Signed)
Medication Instructions:  Your physician recommends that you continue on your current medications as directed. Please refer to the Current Medication list given to you today.   Labwork: None ordered  Testing/Procedures: None ordered  Follow-Up: Your physician wants you to follow-up in: 12 months with Chanetta Marshall, NP You will receive a reminder letter in the mail two months in advance. If you don't receive a letter, please call our office to schedule the follow-up appointment.  Remote monitoring is used to monitor your Pacemaker  from home. This monitoring reduces the number of office visits required to check your device to one time per year. It allows [place] to keep an eye on the functioning of your device to ensure it is working properly. You are scheduled for a device check from home on 12/05/14. You may send your transmission at any time that day. If you have a wireless device, the transmission will be sent automatically. After your physician reviews your transmission, you will receive a postcard with your next transmission date.     Any Other Special Instructions Will Be Listed Below (If Applicable).

## 2014-09-05 NOTE — Progress Notes (Signed)
PCP: [name], MD Primary Cardiologist:  Dr Stanford Breed   .  Today, she denies symptoms of palpitations, chest pain, shortness of breath,  lower extremity edema, dizziness, presyncope, presyncope, or syncope.  The patient is otherwise without complaint today.   Past Medical History  Diagnosis Date  . HLD (hyperlipidemia)   . Hypothyroid   . Goiter   . Cholelithiasis   . Arthritis     ddd-- occ. pain  . Eczema   . Lesion of bladder     hematuria/ frequency  . Hx of cardiovascular stress test     a. ETT-MV 7/14:  Low risk, ant defect c/w soft tissue atten., no ischemia, EF 77%  . SAH (subarachnoid hemorrhage)   . SDH (subdural hematoma)   . Syncope     pauses of >4 seconds  . Sick sinus syndrome    Past Surgical History  Procedure Laterality Date  . Appendectomy  age 7  . Abdominal hysterectomy  age 52  . Bilateral salpingoophectomy  age 51  . Gallstones removed  age 72  . Cataract extraction w/ intraocular lens  implant, bilateral  2012  . Cystoscopy with biopsy  11/21/2010    Procedure: CYSTOSCOPY WITH BIOPSY;  Surgeon: Molli Hazard, MD;  Location: [facility];  Service: Urology;  Laterality: N/A;  CYSTOSCOPY WITH BLADDER BIOPSY  AND INSTILLATION OF INDIGO CARMINE  . Cystoscopy/retrograde/ureteroscopy  11/21/2010    Procedure: CYSTOSCOPY/RETROGRADE/URETEROSCOPY;  Surgeon: Molli Hazard, MD;  Location: [facility];  Service: Urology;  Laterality: Left;  . Pacemaker insertion  05-20-2013    MDT ADDRL1 pacemaker implanted by Dr Rayann Heman for SSS and symptomatic bradycardia  . Loop recorder implant N/A 12/31/2012    Procedure: LOOP RECORDER IMPLANT;  Surgeon: Coralyn Mark, MD;  Location: [facility] [facility];  Service: Cardiovascular;  Laterality: N/A;  . Permanent pacemaker insertion Left 05/20/2013    Procedure: PERMANENT PACEMAKER INSERTION;  Surgeon: Coralyn Mark, MD;  Location: [place] [facility];  Service: Cardiovascular;  Laterality: Left;     ROS- all systems are reviewed and negative except as per HPI above  Current Outpatient Prescriptions  Medication Sig Dispense Refill  . aspirin 325 MG tablet Take 325 mg by mouth 2 (two) times a week.    . beta carotene w/minerals (OCUVITE) tablet Take 1 tablet by mouth daily.    . betamethasone valerate ointment (VALISONE) 0.1 % Apply 1 application topically daily as needed (eczema).     . cycloSPORINE (RESTASIS) 0.05 % ophthalmic emulsion Place 1 drop into both eyes 2 (two) times daily.     Marland Kitchen levothyroxine (SYNTHROID, LEVOTHROID) 88 MCG tablet Take 88 mcg by mouth every morning.     . Multiple Vitamin (MULTIVITAMIN WITH MINERALS) TABS tablet Take 1 tablet by mouth daily.    . potassium citrate (UROCIT-K) 10 MEQ (1080 MG) SR tablet Take 10 mEq by mouth 2 (two) times daily.    Marland Kitchen tobramycin (TOBREX) 0.3 % ophthalmic solution Place 1 drop into the right eye See admin instructions. Once monthly, administer 1 drop into right eye four times a day on the day prior to your eye treatment procedure at [facility] and for 1 day after the eye procedure. Repeat this monthly x 4 more treatments.    Marland Kitchen VITAMIN E PO Take 1 tablet by mouth daily.     No current facility-administered medications for this visit.   Facility-Administered Medications Ordered in Other Visits  Medication Dose Route Frequency Provider  Last Rate Last Dose  . 0.9 %  sodium chloride infusion   Intravenous Continuous Thompson Grayer, MD      . chlorhexidine (HIBICLENS) 4 % liquid 4 application  60 mL Topical Once Thompson Grayer, MD        Physical Exam: Filed Vitals:   09/05/14 0903  BP: 118/58  Pulse: 79  Height: 5' 7" (1.702 m)  Weight: 72.303 kg (159 lb 6.4 oz)    GEN- The patient is well appearing, alert and oriented x 3 today.   Head- normocephalic, atraumatic Eyes-  Sclera clear, conjunctiva pink Ears- hearing intact Oropharynx- clear Lungs- Clear to ausculation bilaterally, normal work of breathing Chest-  pacemaker pocket is well healed Heart- Regular rate and rhythm, no murmurs, rubs or gallops, PMI not laterally displaced GI- soft, NT, ND, + BS Extremities- no clubbing, cyanosis, or edema  Pacemaker interrogation- reviewed in detail today,  See PACEART report  Assessment and Plan:  1. Sick sinus syndrome Normal pacemaker function See Pace Art report No changes today  2. Syncope Resolved with PPM  3. pacs Frequent mode switches are noted.  Overall burden <.1%.  All electrams reviewed reveal only atach/ pacs.  I do not see afib. Her prior SDH was in the setting of syncope/ fall.  I would consider anticoagulation if she has well documented afib in the future.  carelink Return to see EP NP followup with Dr Stanford Breed when needed

## 2014-12-05 ENCOUNTER — Ambulatory Visit (INDEPENDENT_AMBULATORY_CARE_PROVIDER_SITE_OTHER): Payer: Medicare HMO | Admitting: *Deleted

## 2014-12-05 ENCOUNTER — Telehealth: Payer: Self-pay | Admitting: Cardiology

## 2014-12-05 DIAGNOSIS — I495 Sick sinus syndrome: Secondary | ICD-10-CM | POA: Diagnosis not present

## 2014-12-05 NOTE — Telephone Encounter (Signed)
Spoke with pt and reminded pt of remote transmission that is due today. Pt verbalized understanding.

## 2014-12-06 ENCOUNTER — Encounter: Payer: Self-pay | Admitting: Cardiology

## 2014-12-06 LAB — CUP PACEART REMOTE DEVICE CHECK
Battery Impedance: 112 Ohm
Battery Remaining Longevity: 155 mo
Battery Voltage: 2.8 V
Brady Statistic AP VP Percent: 0 %
Brady Statistic AP VS Percent: 13 %
Brady Statistic AS VS Percent: 86 %
Implantable Lead Implant Date: 20150507
Implantable Lead Implant Date: 20150507
Implantable Lead Location: 753860
Implantable Lead Model: 5092
Lead Channel Impedance Value: 537 Ohm
Lead Channel Pacing Threshold Amplitude: 0.625 V
Lead Channel Pacing Threshold Pulse Width: 0.4 ms
Lead Channel Pacing Threshold Pulse Width: 0.4 ms
Lead Channel Sensing Intrinsic Amplitude: 1.4 mV
Lead Channel Sensing Intrinsic Amplitude: 8 mV
Lead Channel Setting Pacing Amplitude: 2 V
Lead Channel Setting Pacing Amplitude: 2.5 V
Lead Channel Setting Pacing Pulse Width: 0.4 ms
MDC IDC LEAD LOCATION: 753859
MDC IDC MSMT LEADCHNL RV IMPEDANCE VALUE: 657 Ohm
MDC IDC MSMT LEADCHNL RV PACING THRESHOLD AMPLITUDE: 0.5 V
MDC IDC SESS DTM: 20161121203240
MDC IDC SET LEADCHNL RV SENSING SENSITIVITY: 4 mV
MDC IDC STAT BRADY AS VP PERCENT: 0 %

## 2014-12-06 NOTE — Progress Notes (Signed)
Remote pacemaker transmission.

## 2015-02-17 ENCOUNTER — Other Ambulatory Visit: Payer: Self-pay

## 2015-02-17 DIAGNOSIS — Z1231 Encounter for screening mammogram for malignant neoplasm of breast: Secondary | ICD-10-CM

## 2015-03-06 ENCOUNTER — Ambulatory Visit (INDEPENDENT_AMBULATORY_CARE_PROVIDER_SITE_OTHER): Payer: Medicare HMO | Admitting: *Deleted

## 2015-03-06 ENCOUNTER — Telehealth: Payer: Self-pay | Admitting: Cardiology

## 2015-03-06 DIAGNOSIS — I495 Sick sinus syndrome: Secondary | ICD-10-CM

## 2015-03-06 NOTE — Progress Notes (Signed)
Remote pacemaker transmission.   

## 2015-03-06 NOTE — Telephone Encounter (Signed)
Spoke with pt and reminded pt of remote transmission that is due today. Pt verbalized understanding.

## 2015-03-09 ENCOUNTER — Ambulatory Visit: Payer: Self-pay

## 2015-03-19 ENCOUNTER — Encounter: Payer: Self-pay | Admitting: Cardiology

## 2015-03-19 LAB — CUP PACEART REMOTE DEVICE CHECK
Battery Remaining Longevity: 160 mo
Battery Voltage: 2.8 V
Brady Statistic AP VP Percent: 0 %
Brady Statistic AS VP Percent: 0 %
Brady Statistic AS VS Percent: 87 %
Implantable Lead Implant Date: 20150507
Implantable Lead Location: 753860
Implantable Lead Model: 5592
Lead Channel Impedance Value: 537 Ohm
Lead Channel Impedance Value: 642 Ohm
Lead Channel Setting Pacing Amplitude: 2.5 V
Lead Channel Setting Sensing Sensitivity: 4 mV
MDC IDC LEAD IMPLANT DT: 20150507
MDC IDC LEAD LOCATION: 753859
MDC IDC MSMT BATTERY IMPEDANCE: 100 Ohm
MDC IDC SESS DTM: 20170220200010
MDC IDC SET LEADCHNL RA PACING AMPLITUDE: 2 V
MDC IDC SET LEADCHNL RV PACING PULSEWIDTH: 0.4 ms
MDC IDC STAT BRADY AP VS PERCENT: 13 %

## 2015-03-19 NOTE — Progress Notes (Signed)
Normal remote reviewed. MS and VHR episodes are AT with 1:1 conduction  Next Carelink 06/05/15

## 2015-03-27 ENCOUNTER — Ambulatory Visit: Payer: Self-pay

## 2015-03-28 ENCOUNTER — Ambulatory Visit
Admission: RE | Admit: 2015-03-28 | Discharge: 2015-03-28 | Disposition: A | Discharge status: Home / Self Care | Payer: Medicare HMO | Source: Ambulatory Visit

## 2015-03-28 DIAGNOSIS — Z1231 Encounter for screening mammogram for malignant neoplasm of breast: Secondary | ICD-10-CM

## 2015-06-05 ENCOUNTER — Telehealth: Payer: Self-pay | Admitting: Cardiology

## 2015-06-05 ENCOUNTER — Ambulatory Visit (INDEPENDENT_AMBULATORY_CARE_PROVIDER_SITE_OTHER): Payer: Medicare HMO | Admitting: *Deleted

## 2015-06-05 DIAGNOSIS — I495 Sick sinus syndrome: Secondary | ICD-10-CM

## 2015-06-05 NOTE — Telephone Encounter (Signed)
Spoke with pt and reminded pt of remote transmission that is due today. Pt verbalized understanding.

## 2015-06-05 NOTE — Progress Notes (Signed)
Remote pacemaker transmission.

## 2015-06-23 LAB — CUP PACEART REMOTE DEVICE CHECK
Battery Impedance: 136 Ohm
Battery Remaining Longevity: 149 mo
Battery Voltage: 2.79 V
Brady Statistic AP VP Percent: 0 %
Brady Statistic AP VS Percent: 12 %
Brady Statistic AS VP Percent: 0 %
Brady Statistic AS VS Percent: 87 %
Date Time Interrogation Session: 20170522143337
Implantable Lead Implant Date: 20150507
Implantable Lead Implant Date: 20150507
Implantable Lead Location: 753859
Implantable Lead Location: 753860
Implantable Lead Model: 5092
Implantable Lead Model: 5592
Lead Channel Impedance Value: 537 Ohm
Lead Channel Impedance Value: 690 Ohm
Lead Channel Pacing Threshold Amplitude: 0.5 V
Lead Channel Pacing Threshold Amplitude: 0.5 V
Lead Channel Pacing Threshold Pulse Width: 0.4 ms
Lead Channel Pacing Threshold Pulse Width: 0.4 ms
Lead Channel Sensing Intrinsic Amplitude: 2.8 mV
Lead Channel Sensing Intrinsic Amplitude: 8 mV
Lead Channel Setting Pacing Amplitude: 2 V
Lead Channel Setting Pacing Amplitude: 2.5 V
Lead Channel Setting Pacing Pulse Width: 0.4 ms
Lead Channel Setting Sensing Sensitivity: 4 mV

## 2015-06-29 ENCOUNTER — Encounter: Payer: Self-pay | Admitting: Cardiology

## 2015-09-15 ENCOUNTER — Encounter: Payer: Self-pay | Admitting: Internal Medicine

## 2015-09-15 ENCOUNTER — Encounter: Payer: Self-pay | Admitting: Nurse Practitioner

## 2015-09-15 ENCOUNTER — Encounter (INDEPENDENT_AMBULATORY_CARE_PROVIDER_SITE_OTHER): Payer: Self-pay

## 2015-09-15 ENCOUNTER — Ambulatory Visit (INDEPENDENT_AMBULATORY_CARE_PROVIDER_SITE_OTHER): Payer: Medicare HMO | Admitting: Nurse Practitioner

## 2015-09-15 VITALS — BP 112/68 | HR 83 | Ht 67.0 in | Wt 158.2 lb

## 2015-09-15 DIAGNOSIS — I491 Atrial premature depolarization: Secondary | ICD-10-CM

## 2015-09-15 DIAGNOSIS — I495 Sick sinus syndrome: Secondary | ICD-10-CM

## 2015-09-15 LAB — CUP PACEART INCLINIC DEVICE CHECK
Implantable Lead Implant Date: 20150507
Implantable Lead Implant Date: 20150507
Implantable Lead Location: 753860
Implantable Lead Model: 5092
MDC IDC LEAD LOCATION: 753859
MDC IDC SESS DTM: 20170901113403

## 2015-09-15 NOTE — Progress Notes (Signed)
Electrophysiology Office Note Date: 09/15/2015  ID:  Nancy Meadows, DOB Dec 30, 1932, MRN [id number]  PCP: [name], MD Primary Cardiologist: Stanford Breed Electrophysiologist: Allred  CC: Pacemaker follow-up  Nancy Meadows is a 82 y.o. female seen today for Dr Rayann Heman.  She presents today for routine electrophysiology followup.  Since last being seen in our clinic, the patient reports doing very well.  She denies chest pain, palpitations, dyspnea, PND, orthopnea, nausea, vomiting, dizziness, syncope, edema, weight gain, or early satiety.  Device History: MDT dual chamber PPM implanted 2015 for sick sinus syndrome   Past Medical History:  Diagnosis Date  . Arthritis    ddd-- occ. pain  . Cholelithiasis   . Eczema   . Goiter   . HLD (hyperlipidemia)   . Hx of cardiovascular stress test    a. ETT-MV 7/14:  Low risk, ant defect c/w soft tissue atten., no ischemia, EF 77%  . Hypothyroid   . Lesion of bladder    hematuria/ frequency  . SAH (subarachnoid hemorrhage) ([place])   . SDH (subdural hematoma) ([place])   . Sick sinus syndrome ([place])   . Syncope    pauses of >4 seconds   Past Surgical History:  Procedure Laterality Date  . ABDOMINAL HYSTERECTOMY  age 34  . APPENDECTOMY  age 32  . bilateral salpingoophectomy  age 65  . CATARACT EXTRACTION W/ INTRAOCULAR LENS  IMPLANT, BILATERAL  2012  . CYSTOSCOPY WITH BIOPSY  11/21/2010   Procedure: CYSTOSCOPY WITH BIOPSY;  Surgeon: Molli Hazard, MD;  Location: [facility];  Service: Urology;  Laterality: N/A;  CYSTOSCOPY WITH BLADDER BIOPSY  AND INSTILLATION OF INDIGO CARMINE  . CYSTOSCOPY/RETROGRADE/URETEROSCOPY  11/21/2010   Procedure: CYSTOSCOPY/RETROGRADE/URETEROSCOPY;  Surgeon: Molli Hazard, MD;  Location: [facility];  Service: Urology;  Laterality: Left;  . gallstones removed  age 73  . LOOP RECORDER IMPLANT N/A 12/31/2012   Procedure: LOOP RECORDER IMPLANT;  Surgeon: Coralyn Mark, MD;  Location: [place] CATH LAB;  Service: Cardiovascular;  Laterality: N/A;  . PACEMAKER INSERTION  05-20-2013   MDT ADDRL1 pacemaker implanted by Dr Rayann Heman for SSS and symptomatic bradycardia  . PERMANENT PACEMAKER INSERTION Left 05/20/2013   Procedure: PERMANENT PACEMAKER INSERTION;  Surgeon: Coralyn Mark, MD;  Location: [place] CATH LAB;  Service: Cardiovascular;  Laterality: Left;    Current Outpatient Prescriptions  Medication Sig Dispense Refill  . aspirin 81 MG tablet Take 81 mg by mouth daily.    . betamethasone valerate ointment (VALISONE) 0.1 % Apply 1 application topically daily as needed (eczema).     . cycloSPORINE (RESTASIS) 0.05 % ophthalmic emulsion Place 1 drop into both eyes 2 (two) times daily.     Marland Kitchen levothyroxine (SYNTHROID, LEVOTHROID) 88 MCG tablet Take 88 mcg by mouth every morning.     . Multiple Vitamin (MULTIVITAMIN WITH MINERALS) TABS tablet Take 1 tablet by mouth daily.    . potassium citrate (UROCIT-K) 10 MEQ (1080 MG) SR tablet Take 10 mEq by mouth 2 (two) times daily.    Marland Kitchen sulfamethoxazole-trimethoprim (BACTRIM DS,SEPTRA DS) 800-160 MG tablet Take 1 tablet by mouth 2 (two) times daily.    Marland Kitchen tobramycin (TOBREX) 0.3 % ophthalmic solution Place 1 drop into the right eye See admin instructions. Once monthly, administer 1 drop into right eye four times a day on the day prior to your eye treatment procedure at [facility] and for 1 day after the eye procedure. Repeat this monthly x 4  more treatments.    Marland Kitchen VITAMIN E PO Take 1 tablet by mouth daily.     No current facility-administered medications for this visit.    Facility-Administered Medications Ordered in Other Visits  Medication Dose Route Frequency Provider Last Rate Last Dose  . 0.9 %  sodium chloride infusion   Intravenous Continuous Thompson Grayer, MD      . chlorhexidine (HIBICLENS) 4 % liquid 4 application  60 mL Topical Once Thompson Grayer, MD        Allergies:   Acetaminophen and Meclizine    Social History: Social History   Social History  . Marital status: Married    Spouse name: N/A  . Number of children: N/A  . Years of education: N/A   Occupational History  . Not on file.   Social History Main Topics  . Smoking status: Never Smoker  . Smokeless tobacco: Never Used     Comment: tobacco use - no  . Alcohol use No  . Drug use: No  . Sexual activity: Not on file   Other Topics Concern  . Not on file   Social History Narrative   Married, pt time Control and instrumentation engineer.     Family History: Family History  Problem Relation Age of Onset  . Congestive Heart Failure Father   . Heart disease Father   . Stroke Mother      Review of Systems: All other systems reviewed and are otherwise negative except as noted above.   Physical Exam: VS:  BP 112/68   Pulse 83   Ht _0  (1.702 m)   Wt 158 lb 3.2 oz (71.8 kg)   BMI 24.78 kg/m  , BMI Body mass index is 24.78 kg/m.  GEN- The patient is elderly appearing, alert and oriented x 3 today.   HEENT: normocephalic, atraumatic; sclera clear, conjunctiva pink; hearing intact; oropharynx clear; neck supple  Lungs- Clear to ausculation bilaterally, normal work of breathing.  No wheezes, rales, rhonchi Heart- Regular rate and rhythm  GI- soft, non-tender, non-distended, bowel sounds present  Extremities- no clubbing, cyanosis, or edema; DP/PT/radial pulses 2+ bilaterally MS- no significant deformity or atrophy Skin- warm and dry, no rash or lesion; PPM pocket well healed Psych- euthymic mood, full affect Neuro- strength and sensation are intact  PPM Interrogation- reviewed in detail today,  See PACEART report  EKG:  EKG is ordered today. The ekg ordered today shows sinus rhythm, 1st degree AV block, PAC's  Recent Labs: No results found for requested labs within last 8760 hours.   Wt Readings from Last 3 Encounters:  09/15/15 158 lb 3.2 oz (71.8 kg)  09/05/14 159 lb 6.4 oz (72.3 kg)  09/02/13 168 lb (76.2 kg)      Other studies Reviewed: Additional studies/ records that were reviewed today include: Dr Jackalyn Lombard office notes  Assessment and Plan:  1.  Sick sinus syndrome Normal PPM function See Pace Art report No changes today  2.  PAC's No sustained atrial fibrillation on device interrogation today (longest episode 4 minutes) Prior SDH was in the setting of syncope/fall Per Dr Jackalyn Lombard notes, would consider [place] if she has documented sustained AF. Will continue remote monitoring    Current medicines are reviewed at length with the patient today.   The patient does not have concerns regarding her medicines.  The following changes were made today:  none  Labs/ tests ordered today include: none Orders Placed This Encounter  Procedures  . EKG 12-Lead  Disposition:   Follow up with [facility], Dr Rayann Heman 1 year    Signed, Chanetta Marshall, NP 09/15/2015 11:32 AM  [facility] [address] [place] [address] [phone] (office) [phone] (fax

## 2015-09-15 NOTE — Patient Instructions (Signed)
Medication Instructions:  Your physician recommends that you continue on your current medications as directed. Please refer to the Current Medication list given to you today.   Labwork: None ordered  Testing/Procedures: None ordered  Follow-Up: Remote monitoring is used to monitor your Pacemaker of ICD from home. This monitoring reduces the number of office visits required to check your device to one time per year. It allows [place] to keep an eye on the functioning of your device to ensure it is working properly. You are scheduled for a device check from home on 12/15/15. You may send your transmission at any time that day. If you have a wireless device, the transmission will be sent automatically. After your physician reviews your transmission, you will receive a postcard with your next transmission date.   Your physician wants you to follow-up in: 1 year with Dr.Allred You will receive a reminder letter in the mail two months in advance. If you don't receive a letter, please call our office to schedule the follow-up appointment.    Any Other Special Instructions Will Be Listed Below (If Applicable).     If you need a refill on your cardiac medications before your next appointment, please call your pharmacy.

## 2016-01-15 DIAGNOSIS — I480 Paroxysmal atrial fibrillation: Secondary | ICD-10-CM

## 2016-01-15 HISTORY — DX: Paroxysmal atrial fibrillation: I48.0

## 2016-02-29 ENCOUNTER — Other Ambulatory Visit: Payer: Self-pay | Admitting: Endocrinology

## 2016-02-29 DIAGNOSIS — Z1231 Encounter for screening mammogram for malignant neoplasm of breast: Secondary | ICD-10-CM

## 2016-03-19 ENCOUNTER — Telehealth: Payer: Self-pay | Admitting: Cardiology

## 2016-03-19 ENCOUNTER — Telehealth: Payer: Self-pay | Admitting: *Deleted

## 2016-03-19 ENCOUNTER — Ambulatory Visit (INDEPENDENT_AMBULATORY_CARE_PROVIDER_SITE_OTHER): Payer: Medicare PPO | Admitting: *Deleted

## 2016-03-19 DIAGNOSIS — I495 Sick sinus syndrome: Secondary | ICD-10-CM

## 2016-03-19 LAB — CUP PACEART REMOTE DEVICE CHECK
Battery Impedance: 136 Ohm
Battery Voltage: 2.8 V
Brady Statistic AP VS Percent: 13 %
Brady Statistic AS VP Percent: 1 %
Brady Statistic AS VS Percent: 86 %
Date Time Interrogation Session: 20180306165947
Implantable Lead Location: 753859
Implantable Lead Model: 5592
Lead Channel Impedance Value: 595 Ohm
Lead Channel Pacing Threshold Amplitude: 0.5 V
Lead Channel Pacing Threshold Amplitude: 0.5 V
Lead Channel Pacing Threshold Pulse Width: 0.4 ms
Lead Channel Pacing Threshold Pulse Width: 0.4 ms
Lead Channel Setting Pacing Amplitude: 2 V
Lead Channel Setting Pacing Pulse Width: 0.4 ms
MDC IDC LEAD IMPLANT DT: 20150507
MDC IDC LEAD IMPLANT DT: 20150507
MDC IDC LEAD LOCATION: 753860
MDC IDC MSMT BATTERY REMAINING LONGEVITY: 145 mo
MDC IDC MSMT LEADCHNL RA IMPEDANCE VALUE: 505 Ohm
MDC IDC PG IMPLANT DT: 20150507
MDC IDC SET LEADCHNL RV PACING AMPLITUDE: 2.5 V
MDC IDC SET LEADCHNL RV SENSING SENSITIVITY: 4 mV
MDC IDC STAT BRADY AP VP PERCENT: 0 %

## 2016-03-19 NOTE — Telephone Encounter (Signed)
Patient states that she is having trouble operating the monitor, please call. Thanks.

## 2016-03-19 NOTE — Progress Notes (Signed)
Remote pacemaker transmission.   

## 2016-03-19 NOTE — Telephone Encounter (Signed)
Spoke with pt and informed her that her remote transmission was received.

## 2016-03-19 NOTE — Telephone Encounter (Signed)
Spoke with pt and reminded pt of remote transmission that is due today. Pt verbalized understanding.   

## 2016-03-20 ENCOUNTER — Encounter: Payer: Self-pay | Admitting: Cardiology

## 2016-03-29 ENCOUNTER — Ambulatory Visit
Admission: RE | Admit: 2016-03-29 | Discharge: 2016-03-29 | Disposition: A | Discharge status: Home / Self Care | Payer: Medicare PPO | Source: Ambulatory Visit | Attending: Endocrinology | Admitting: Endocrinology

## 2016-03-29 DIAGNOSIS — Z1231 Encounter for screening mammogram for malignant neoplasm of breast: Secondary | ICD-10-CM

## 2016-05-01 ENCOUNTER — Telehealth: Payer: Self-pay

## 2016-05-01 NOTE — Telephone Encounter (Signed)
Spoke with patient who reports "tingling" around site. Will have her send a remote transmission and review it

## 2016-05-06 NOTE — Telephone Encounter (Signed)
Informed patient that transmission was stable for patient. She verbalized understanding and will call with any other concerns.

## 2016-05-21 ENCOUNTER — Telehealth: Payer: Self-pay | Admitting: Nurse Practitioner

## 2016-05-21 NOTE — Telephone Encounter (Signed)
Reviewed remote transmission. Patient states that her incision feels sore intermittently. She denies any redness, swelling or drainage. I confirmed her device was functioning appropriately. She was appreciative and will call with any other concerns.

## 2016-05-21 NOTE — Telephone Encounter (Signed)
New message     Pt states she is has been having tingling right above pacemaker, now she is getting a pain at the device that comes and go

## 2016-06-18 ENCOUNTER — Telehealth: Payer: Self-pay | Admitting: Cardiology

## 2016-06-18 ENCOUNTER — Ambulatory Visit (INDEPENDENT_AMBULATORY_CARE_PROVIDER_SITE_OTHER): Payer: Medicare PPO | Admitting: *Deleted

## 2016-06-18 DIAGNOSIS — I495 Sick sinus syndrome: Secondary | ICD-10-CM | POA: Diagnosis not present

## 2016-06-18 NOTE — Progress Notes (Signed)
Remote pacemaker transmission.   

## 2016-06-18 NOTE — Telephone Encounter (Signed)
LMOVM reminding pt to send remote transmission.   

## 2016-06-21 LAB — CUP PACEART REMOTE DEVICE CHECK
Brady Statistic AP VP Percent: 0 %
Brady Statistic AP VS Percent: 10 %
Brady Statistic AS VS Percent: 88 %
Date Time Interrogation Session: 20180605155442
Implantable Lead Implant Date: 20150507
Implantable Lead Location: 753859
Implantable Lead Model: 5592
Lead Channel Impedance Value: 563 Ohm
Lead Channel Impedance Value: 674 Ohm
Lead Channel Pacing Threshold Amplitude: 0.5 V
Lead Channel Pacing Threshold Pulse Width: 0.4 ms
Lead Channel Sensing Intrinsic Amplitude: 8 mV
Lead Channel Setting Pacing Amplitude: 2 V
Lead Channel Setting Pacing Pulse Width: 0.4 ms
Lead Channel Setting Sensing Sensitivity: 4 mV
MDC IDC LEAD IMPLANT DT: 20150507
MDC IDC LEAD LOCATION: 753860
MDC IDC MSMT BATTERY IMPEDANCE: 136 Ohm
MDC IDC MSMT BATTERY REMAINING LONGEVITY: 147 mo
MDC IDC MSMT BATTERY VOLTAGE: 2.79 V
MDC IDC MSMT LEADCHNL RA PACING THRESHOLD AMPLITUDE: 0.5 V
MDC IDC MSMT LEADCHNL RA PACING THRESHOLD PULSEWIDTH: 0.4 ms
MDC IDC MSMT LEADCHNL RA SENSING INTR AMPL: 1 mV
MDC IDC PG IMPLANT DT: 20150507
MDC IDC SET LEADCHNL RV PACING AMPLITUDE: 2.5 V
MDC IDC STAT BRADY AS VP PERCENT: 1 %

## 2016-06-25 ENCOUNTER — Encounter: Payer: Self-pay | Admitting: Cardiology

## 2016-07-15 NOTE — Progress Notes (Signed)
HPI: FU chest pain and PAT. Patient was admitted 01/2012 after suffering a subdural hematoma and subarachnoid hemorrhage in the setting of syncope. She did not require surgery. Carotid Dopplers 01/2012: Negative for ICA stenosis. Monitor 6/14 showed sinus with pacs and PAT. Myoview repeated in July 2014 and showed an ejection fraction of 77%. There is soft tissue attenuation but no ischemia. ILR 12/14 showed symptomatic pauses and she ultimately had pacemaker implanted. I last saw her in 2014. Since then, she denies dyspnea, chest pain, palpitations or recurrent syncope. She has had some pain over her pacemaker.  Current Outpatient Prescriptions  Medication Sig Dispense Refill  . aspirin 81 MG tablet Take 81 mg by mouth daily.    . betamethasone valerate ointment (VALISONE) 0.1 % Apply 1 application topically daily as needed (eczema).     . cycloSPORINE (RESTASIS) 0.05 % ophthalmic emulsion Place 1 drop into both eyes 2 (two) times daily.     Marland Kitchen levothyroxine (SYNTHROID, LEVOTHROID) 88 MCG tablet Take 88 mcg by mouth every morning.     . Multiple Vitamin (MULTIVITAMIN WITH MINERALS) TABS tablet Take 1 tablet by mouth daily.    . potassium citrate (UROCIT-K) 10 MEQ (1080 MG) SR tablet Take 10 mEq by mouth 2 (two) times daily.    Marland Kitchen sulfamethoxazole-trimethoprim (BACTRIM DS,SEPTRA DS) 800-160 MG tablet Take 1 tablet by mouth 2 (two) times daily.    Marland Kitchen tobramycin (TOBREX) 0.3 % ophthalmic solution Place 1 drop into the right eye See admin instructions. Once monthly, administer 1 drop into right eye four times a day on the day prior to your eye treatment procedure at [facility] and for 1 day after the eye procedure. Repeat this monthly x 4 more treatments.    Marland Kitchen VITAMIN E PO Take 1 tablet by mouth daily.     No current facility-administered medications for this visit.    Facility-Administered Medications Ordered in Other Visits  Medication Dose Route Frequency Provider Last Rate Last Dose    . 0.9 %  sodium chloride infusion   Intravenous Continuous Allred, James, MD      . chlorhexidine (HIBICLENS) 4 % liquid 4 application  60 mL Topical Once Thompson Grayer, MD        Allergies  Allergen Reactions  . Acetaminophen Other (See Comments)    Syncope  . Meclizine Other (See Comments)    Increased dizziness and confusion    Past Medical History:  Diagnosis Date  . Arthritis    ddd-- occ. pain  . Cholelithiasis   . Eczema   . Goiter   . HLD (hyperlipidemia)   . Hx of cardiovascular stress test    a. ETT-MV 7/14:  Low risk, ant defect c/w soft tissue atten., no ischemia, EF 77%  . Hypothyroid   . Lesion of bladder    hematuria/ frequency  . SAH (subarachnoid hemorrhage) ([place])   . SDH (subdural hematoma) ([place])   . Sick sinus syndrome ([place])   . Syncope    pauses of >4 seconds    Past Surgical History:  Procedure Laterality Date  . ABDOMINAL HYSTERECTOMY  age 4  . APPENDECTOMY  age 60  . bilateral salpingoophectomy  age 83  . CATARACT EXTRACTION W/ INTRAOCULAR LENS  IMPLANT, BILATERAL  2012  . CYSTOSCOPY WITH BIOPSY  11/21/2010   Procedure: CYSTOSCOPY WITH BIOPSY;  Surgeon: Molli Hazard, MD;  Location: [facility];  Service: Urology;  Laterality: N/A;  CYSTOSCOPY WITH BLADDER BIOPSY  AND  INSTILLATION OF INDIGO CARMINE  . CYSTOSCOPY/RETROGRADE/URETEROSCOPY  11/21/2010   Procedure: CYSTOSCOPY/RETROGRADE/URETEROSCOPY;  Surgeon: Molli Hazard, MD;  Location: [facility];  Service: Urology;  Laterality: Left;  . gallstones removed  age 63  . LOOP RECORDER IMPLANT N/A 12/31/2012   Procedure: LOOP RECORDER IMPLANT;  Surgeon: Coralyn Mark, MD;  Location: [place] CATH LAB;  Service: Cardiovascular;  Laterality: N/A;  . PACEMAKER INSERTION  05-20-2013   MDT ADDRL1 pacemaker implanted by Dr Rayann Heman for SSS and symptomatic bradycardia  . PERMANENT PACEMAKER INSERTION Left 05/20/2013   Procedure: PERMANENT PACEMAKER INSERTION;  Surgeon:  Coralyn Mark, MD;  Location: [place] CATH LAB;  Service: Cardiovascular;  Laterality: Left;    Social History   Social History  . Marital status: Married    Spouse name: N/A  . Number of children: N/A  . Years of education: N/A   Occupational History  . Not on file.   Social History Main Topics  . Smoking status: Never Smoker  . Smokeless tobacco: Never Used     Comment: tobacco use - no  . Alcohol use No  . Drug use: No  . Sexual activity: Not on file   Other Topics Concern  . Not on file   Social History Narrative   Married, pt time Control and instrumentation engineer.     Family History  Problem Relation Age of Onset  . Congestive Heart Failure Father   . Heart disease Father   . Stroke Mother     ROS: no fevers or chills, productive cough, hemoptysis, dysphasia, odynophagia, melena, hematochezia, dysuria, hematuria, rash, seizure activity, orthopnea, PND, pedal edema, claudication. Remaining systems are negative.  Physical Exam:   There were no vitals taken for this visit.  General:  Well developed/well nourished in NAD Skin warm/dry Patient not depressed No peripheral clubbing Back-normal HEENT-normal/normal eyelids Neck supple/normal carotid upstroke bilaterally; no bruits; no JVD; no thyromegaly chest - CTA/ normal expansion; Pacemaker left upper chest without hematoma or evidence of infection.;  CV - RRR/normal S1 and S2; no murmurs, rubs or gallops;  PMI nondisplaced Abdomen -NT/ND, no HSM, no mass, + bowel sounds, no bruit 2+ femoral pulses, no bruits Ext-no edema, chords, 2+ DP Neuro-grossly nonfocal  ECG - Sinus rhythm with first-degree AV block and PACs. Left axis deviation. No ST changes. personally reviewed  A/P  1 status post pacemaker-management per electrophysiology. Patient has had some pain over pacemaker site has now improved. Negative physical exam.    2 history of chest pain-no recent symptoms. Previous evaluation negative. Some pain over pacemaker site  that does not sound ischemia mediated.  3 hyperlipidemia-management per primary care.  Kirk Ruths, MD

## 2016-07-29 ENCOUNTER — Encounter: Payer: Self-pay | Admitting: Cardiology

## 2016-07-29 ENCOUNTER — Ambulatory Visit (INDEPENDENT_AMBULATORY_CARE_PROVIDER_SITE_OTHER): Payer: Medicare PPO | Admitting: Cardiology

## 2016-07-29 VITALS — BP 116/70 | HR 85 | Ht 67.5 in | Wt 157.0 lb

## 2016-07-29 DIAGNOSIS — R072 Precordial pain: Secondary | ICD-10-CM | POA: Diagnosis not present

## 2016-07-29 DIAGNOSIS — E7800 Pure hypercholesterolemia, unspecified: Secondary | ICD-10-CM | POA: Diagnosis not present

## 2016-07-29 NOTE — Patient Instructions (Signed)
Your physician recommends that you schedule a follow-up appointment in: AS NEEDED

## 2016-09-17 ENCOUNTER — Ambulatory Visit (INDEPENDENT_AMBULATORY_CARE_PROVIDER_SITE_OTHER): Payer: Medicare PPO | Admitting: *Deleted

## 2016-09-17 DIAGNOSIS — I495 Sick sinus syndrome: Secondary | ICD-10-CM | POA: Diagnosis not present

## 2016-09-18 ENCOUNTER — Encounter: Payer: Self-pay | Admitting: Internal Medicine

## 2016-09-18 ENCOUNTER — Ambulatory Visit (INDEPENDENT_AMBULATORY_CARE_PROVIDER_SITE_OTHER): Payer: Medicare PPO | Admitting: Internal Medicine

## 2016-09-18 VITALS — BP 122/66 | HR 79 | Ht 67.5 in | Wt 158.8 lb

## 2016-09-18 DIAGNOSIS — I495 Sick sinus syndrome: Secondary | ICD-10-CM | POA: Diagnosis not present

## 2016-09-18 DIAGNOSIS — I480 Paroxysmal atrial fibrillation: Secondary | ICD-10-CM

## 2016-09-18 MED ORDER — METOPROLOL SUCCINATE ER 25 MG PO TB24: 25.0000 mg | ORAL_TABLET | Freq: Every day | ORAL | 3 refills | Status: DC

## 2016-09-18 NOTE — Progress Notes (Signed)
Remote pacemaker transmission.   

## 2016-09-18 NOTE — Progress Notes (Signed)
PCP: Anda Kraft, MD Primary Cardiologist: Primary EP:  Dr Rayann Heman  Nancy Meadows is a 84 y.o. female who presents today for routine electrophysiology followup.  Since last being seen in our clinic, the patient reports doing very well.   She has rare palpitations.   Today, she denies symptoms of chest pain, shortness of breath,  lower extremity edema, dizziness, presyncope, or syncope.  The patient is otherwise without complaint today.   Past Medical History:  Diagnosis Date  . Arthritis    ddd-- occ. pain  . Cholelithiasis   . Eczema   . Goiter   . HLD (hyperlipidemia)   . Hx of cardiovascular stress test    a. ETT-MV 7/14:  Low risk, ant defect c/w soft tissue atten., no ischemia, EF 77%  . Hypothyroid   . Lesion of bladder    hematuria/ frequency  . SAH (subarachnoid hemorrhage) ([place])   . SDH (subdural hematoma) ([place])   . Sick sinus syndrome ([place])   . Syncope    pauses of >4 seconds   Past Surgical History:  Procedure Laterality Date  . ABDOMINAL HYSTERECTOMY  age 15  . APPENDECTOMY  age 29  . bilateral salpingoophectomy  age 43  . CATARACT EXTRACTION W/ INTRAOCULAR LENS  IMPLANT, BILATERAL  2012  . CYSTOSCOPY WITH BIOPSY  11/21/2010   Procedure: CYSTOSCOPY WITH BIOPSY;  Surgeon: Molli Hazard, MD;  Location: [facility];  Service: Urology;  Laterality: N/A;  CYSTOSCOPY WITH BLADDER BIOPSY  AND INSTILLATION OF INDIGO CARMINE  . CYSTOSCOPY/RETROGRADE/URETEROSCOPY  11/21/2010   Procedure: CYSTOSCOPY/RETROGRADE/URETEROSCOPY;  Surgeon: Molli Hazard, MD;  [facility];  Service: Urology;  Laterality: Left;  . gallstones removed  age 12  . LOOP RECORDER IMPLANT N/A 12/31/2012   Procedure: LOOP RECORDER IMPLANT;  Surgeon: Coralyn Mark, MD;  Location: [place] CATH LAB;  Service: Cardiovascular;  Laterality: N/A;  . PACEMAKER INSERTION  05-20-2013   MDT ADDRL1 pacemaker implanted by Dr Rayann Heman for SSS and symptomatic bradycardia   . PERMANENT PACEMAKER INSERTION Left 05/20/2013   Procedure: PERMANENT PACEMAKER INSERTION;  Surgeon: Coralyn Mark, MD;  Location: [place] CATH LAB;  Service: Cardiovascular;  Laterality: Left;    ROS- all systems are reviewed and negative except as per HPI above  Current Outpatient Prescriptions  Medication Sig Dispense Refill  . aspirin 81 MG tablet Take 81 mg by mouth daily.    . betamethasone valerate ointment (VALISONE) 0.1 % Apply 1 application topically daily as needed (eczema).     . cycloSPORINE (RESTASIS) 0.05 % ophthalmic emulsion Place 1 drop into both eyes 2 (two) times daily.     Marland Kitchen levothyroxine (SYNTHROID, LEVOTHROID) 88 MCG tablet Take 88 mcg by mouth every morning.     . potassium citrate (UROCIT-K) 10 MEQ (1080 MG) SR tablet Take 10 mEq by mouth 2 (two) times daily.    Marland Kitchen tobramycin (TOBREX) 0.3 % ophthalmic solution Place 1 drop into the right eye See admin instructions. Once monthly, administer 1 drop into right eye four times a day on the day prior to your eye treatment procedure at [facility] and for 1 day after the eye procedure. Repeat this monthly x 4 more treatments.    Marland Kitchen VITAMIN E PO Take 1 tablet by mouth daily.     No current facility-administered medications for this visit.    Facility-Administered Medications Ordered in Other Visits  Medication Dose Route Frequency Provider Last Rate Last Dose  .  0.9 %  sodium chloride infusion   Intravenous Continuous Sevastian Witczak, MD      . chlorhexidine (HIBICLENS) 4 % liquid 4 application  60 mL Topical Once Breshae Belcher, Jeneen Rinks, MD        Physical Exam: Vitals:   09/18/16 1506  BP: 122/66  Pulse: 79  SpO2: 99%  Weight: 158 lb 12.8 oz (72 kg)  Height: 5' 7.5" (1.715 m)    GEN- The patient is well appearing, alert and oriented x 3 today.   Head- normocephalic, atraumatic Eyes-  Sclera clear, conjunctiva pink Ears- hearing intact Oropharynx- clear Lungs- Clear to ausculation bilaterally, normal work of  breathing Chest- pacemaker pocket is well healed Heart- Regular rate and rhythm, no murmurs, rubs or gallops, PMI not laterally displaced GI- soft, NT, ND, + BS Extremities- no clubbing, cyanosis, or edema  Pacemaker interrogation- reviewed in detail today,  See PACEART report  Assessment and Plan:  1. Symptomatic sinus bradycardia  Normal pacemaker function See Pace Art report No changes today  2. afib and atrial tachycardia She has episodes of atrial arrhythmia with elevated V rates, longest episode was > 96 hours in January.   Overall burden is 18.8 %.  Will add metoprolol for rate control chads2vasc score is 3.  Her prior SDH was in the setting of syncope and fall prior to PPM.  I do think that we could initiate anticoagulation at this time however she declines.  She may be more willing to consider if her afib burden continues to increase. Will follow this remotely with [facility] Return to see EP NP in 1 year Follow-up with Dr Stanford Breed as scheduled  Thompson Grayer MD, [facility] 09/18/2016 3:26 PM

## 2016-09-18 NOTE — Patient Instructions (Signed)
Medication Instructions:  Your physician has recommended you make the following change in your medication:  1) Start Metoprolol 25 mg daily   Labwork: None ordered   Testing/Procedures: None ordered   Follow-Up: Your physician wants you to follow-up in: 12 months with Nancy Marshall, NP You will receive a reminder letter in the mail two months in advance. If you don't receive a letter, please call our office to schedule the follow-up appointment.   Remote monitoring is used to monitor your Pacemaker  from home. This monitoring reduces the number of office visits required to check your device to one time per year. It allows [place] to keep an eye on the functioning of your device to ensure it is working properly. You are scheduled for a device check from home on 12/17/16. You may send your transmission at any time that day. If you have a wireless device, the transmission will be sent automatically. After your physician reviews your transmission, you will receive a postcard with your next transmission date.    Any Other Special Instructions Will Be Listed Below (If Applicable).     If you need a refill on your cardiac medications before your next appointment, please call your pharmacy.

## 2016-09-22 ENCOUNTER — Encounter: Payer: Self-pay | Admitting: Internal Medicine

## 2016-09-25 ENCOUNTER — Encounter: Payer: Self-pay | Admitting: Cardiology

## 2016-09-26 LAB — CUP PACEART REMOTE DEVICE CHECK
Brady Statistic AP VS Percent: 10 %
Brady Statistic AS VS Percent: 89 %
Implantable Lead Implant Date: 20150507
Implantable Lead Location: 753859
Implantable Lead Location: 753860
Implantable Lead Model: 5592
Lead Channel Pacing Threshold Amplitude: 0.5 V
Lead Channel Pacing Threshold Pulse Width: 0.4 ms
Lead Channel Sensing Intrinsic Amplitude: 1.4 mV
Lead Channel Sensing Intrinsic Amplitude: 8 mV
Lead Channel Setting Pacing Amplitude: 2 V
Lead Channel Setting Pacing Pulse Width: 0.4 ms
MDC IDC LEAD IMPLANT DT: 20150507
MDC IDC MSMT BATTERY IMPEDANCE: 160 Ohm
MDC IDC MSMT BATTERY REMAINING LONGEVITY: 141 mo
MDC IDC MSMT BATTERY VOLTAGE: 2.79 V
MDC IDC MSMT LEADCHNL RA IMPEDANCE VALUE: 521 Ohm
MDC IDC MSMT LEADCHNL RA PACING THRESHOLD AMPLITUDE: 0.5 V
MDC IDC MSMT LEADCHNL RA PACING THRESHOLD PULSEWIDTH: 0.4 ms
MDC IDC MSMT LEADCHNL RV IMPEDANCE VALUE: 632 Ohm
MDC IDC PG IMPLANT DT: 20150507
MDC IDC SESS DTM: 20180903130525
MDC IDC SET LEADCHNL RV PACING AMPLITUDE: 2.5 V
MDC IDC SET LEADCHNL RV SENSING SENSITIVITY: 4 mV
MDC IDC STAT BRADY AP VP PERCENT: 0 %
MDC IDC STAT BRADY AS VP PERCENT: 1 %

## 2016-09-26 LAB — CUP PACEART INCLINIC DEVICE CHECK
Battery Impedance: 160 Ohm
Battery Remaining Longevity: 141 mo
Brady Statistic AP VP Percent: 0 %
Brady Statistic AS VS Percent: 89 %
Implantable Lead Implant Date: 20150507
Implantable Lead Location: 753860
Implantable Lead Model: 5592
Implantable Pulse Generator Implant Date: 20150507
Lead Channel Pacing Threshold Amplitude: 0.5 V
Lead Channel Pacing Threshold Pulse Width: 0.4 ms
Lead Channel Sensing Intrinsic Amplitude: 2 mV
Lead Channel Setting Pacing Amplitude: 2.5 V
Lead Channel Setting Pacing Pulse Width: 0.4 ms
Lead Channel Setting Sensing Sensitivity: 4 mV
MDC IDC LEAD IMPLANT DT: 20150507
MDC IDC LEAD LOCATION: 753859
MDC IDC MSMT BATTERY VOLTAGE: 2.79 V
MDC IDC MSMT LEADCHNL RA IMPEDANCE VALUE: 519 Ohm
MDC IDC MSMT LEADCHNL RA PACING THRESHOLD PULSEWIDTH: 0.4 ms
MDC IDC MSMT LEADCHNL RV IMPEDANCE VALUE: 619 Ohm
MDC IDC MSMT LEADCHNL RV PACING THRESHOLD AMPLITUDE: 0.5 V
MDC IDC MSMT LEADCHNL RV SENSING INTR AMPL: 11.2 mV
MDC IDC SESS DTM: 20180905191445
MDC IDC SET LEADCHNL RA PACING AMPLITUDE: 2 V
MDC IDC STAT BRADY AP VS PERCENT: 10 %
MDC IDC STAT BRADY AS VP PERCENT: 1 %

## 2016-12-17 ENCOUNTER — Ambulatory Visit (INDEPENDENT_AMBULATORY_CARE_PROVIDER_SITE_OTHER): Payer: Medicare PPO | Admitting: *Deleted

## 2016-12-17 DIAGNOSIS — I495 Sick sinus syndrome: Secondary | ICD-10-CM

## 2016-12-18 ENCOUNTER — Encounter: Payer: Self-pay | Admitting: Cardiology

## 2016-12-18 LAB — CUP PACEART REMOTE DEVICE CHECK
Brady Statistic AP VS Percent: 25 %
Brady Statistic AS VS Percent: 74 %
Date Time Interrogation Session: 20181204173643
Implantable Lead Implant Date: 20150507
Implantable Lead Model: 5092
Implantable Lead Model: 5592
Lead Channel Impedance Value: 537 Ohm
Lead Channel Pacing Threshold Amplitude: 0.5 V
Lead Channel Pacing Threshold Amplitude: 0.5 V
Lead Channel Pacing Threshold Pulse Width: 0.4 ms
Lead Channel Setting Pacing Amplitude: 2 V
Lead Channel Setting Pacing Pulse Width: 0.4 ms
MDC IDC LEAD IMPLANT DT: 20150507
MDC IDC LEAD LOCATION: 753859
MDC IDC LEAD LOCATION: 753860
MDC IDC MSMT BATTERY IMPEDANCE: 160 Ohm
MDC IDC MSMT BATTERY REMAINING LONGEVITY: 141 mo
MDC IDC MSMT BATTERY VOLTAGE: 2.79 V
MDC IDC MSMT LEADCHNL RA PACING THRESHOLD PULSEWIDTH: 0.4 ms
MDC IDC MSMT LEADCHNL RV IMPEDANCE VALUE: 582 Ohm
MDC IDC PG IMPLANT DT: 20150507
MDC IDC SET LEADCHNL RV PACING AMPLITUDE: 2.5 V
MDC IDC SET LEADCHNL RV SENSING SENSITIVITY: 4 mV
MDC IDC STAT BRADY AP VP PERCENT: 1 %
MDC IDC STAT BRADY AS VP PERCENT: 1 %

## 2016-12-18 NOTE — Progress Notes (Signed)
Remote pacemaker transmission.   

## 2017-02-21 DIAGNOSIS — H3582 Retinal ischemia: Secondary | ICD-10-CM | POA: Diagnosis not present

## 2017-02-21 DIAGNOSIS — H34831 Tributary (branch) retinal vein occlusion, right eye, with macular edema: Secondary | ICD-10-CM | POA: Diagnosis not present

## 2017-02-21 DIAGNOSIS — H3561 Retinal hemorrhage, right eye: Secondary | ICD-10-CM | POA: Diagnosis not present

## 2017-02-21 DIAGNOSIS — H35362 Drusen (degenerative) of macula, left eye: Secondary | ICD-10-CM | POA: Diagnosis not present

## 2017-03-18 ENCOUNTER — Ambulatory Visit (INDEPENDENT_AMBULATORY_CARE_PROVIDER_SITE_OTHER): Payer: Self-pay | Admitting: *Deleted

## 2017-03-18 DIAGNOSIS — I495 Sick sinus syndrome: Secondary | ICD-10-CM

## 2017-03-18 LAB — CUP PACEART REMOTE DEVICE CHECK
Battery Remaining Longevity: 136 mo
Battery Voltage: 2.79 V
Brady Statistic AP VP Percent: 0 %
Brady Statistic AS VP Percent: 1 %
Implantable Lead Implant Date: 20150507
Implantable Lead Model: 5592
Implantable Pulse Generator Implant Date: 20150507
Lead Channel Impedance Value: 513 Ohm
Lead Channel Pacing Threshold Amplitude: 0.5 V
Lead Channel Setting Pacing Amplitude: 2 V
Lead Channel Setting Pacing Pulse Width: 0.4 ms
MDC IDC LEAD IMPLANT DT: 20150507
MDC IDC LEAD LOCATION: 753859
MDC IDC LEAD LOCATION: 753860
MDC IDC MSMT BATTERY IMPEDANCE: 184 Ohm
MDC IDC MSMT LEADCHNL RA PACING THRESHOLD PULSEWIDTH: 0.4 ms
MDC IDC MSMT LEADCHNL RV IMPEDANCE VALUE: 582 Ohm
MDC IDC MSMT LEADCHNL RV PACING THRESHOLD AMPLITUDE: 0.5 V
MDC IDC MSMT LEADCHNL RV PACING THRESHOLD PULSEWIDTH: 0.4 ms
MDC IDC SESS DTM: 20190305141904
MDC IDC SET LEADCHNL RV PACING AMPLITUDE: 2.5 V
MDC IDC SET LEADCHNL RV SENSING SENSITIVITY: 4 mV
MDC IDC STAT BRADY AP VS PERCENT: 24 %
MDC IDC STAT BRADY AS VS PERCENT: 75 %

## 2017-03-18 NOTE — Progress Notes (Signed)
Remote pacemaker transmission.

## 2017-03-19 ENCOUNTER — Encounter: Payer: Self-pay | Admitting: Cardiology

## 2017-03-24 ENCOUNTER — Other Ambulatory Visit: Payer: Self-pay | Admitting: Endocrinology

## 2017-03-24 DIAGNOSIS — Z1231 Encounter for screening mammogram for malignant neoplasm of breast: Secondary | ICD-10-CM

## 2017-04-03 DIAGNOSIS — E039 Hypothyroidism, unspecified: Secondary | ICD-10-CM | POA: Diagnosis not present

## 2017-04-03 DIAGNOSIS — Z23 Encounter for immunization: Secondary | ICD-10-CM | POA: Diagnosis not present

## 2017-04-03 DIAGNOSIS — R5383 Other fatigue: Secondary | ICD-10-CM | POA: Diagnosis not present

## 2017-04-03 DIAGNOSIS — Z0000 Encounter for general adult medical examination without abnormal findings: Secondary | ICD-10-CM | POA: Diagnosis not present

## 2017-04-03 DIAGNOSIS — M8580 Other specified disorders of bone density and structure, unspecified site: Secondary | ICD-10-CM | POA: Diagnosis not present

## 2017-04-03 DIAGNOSIS — Z780 Asymptomatic menopausal state: Secondary | ICD-10-CM | POA: Diagnosis not present

## 2017-04-08 DIAGNOSIS — E039 Hypothyroidism, unspecified: Secondary | ICD-10-CM | POA: Diagnosis not present

## 2017-04-08 DIAGNOSIS — I609 Nontraumatic subarachnoid hemorrhage, unspecified: Secondary | ICD-10-CM | POA: Diagnosis not present

## 2017-04-08 DIAGNOSIS — I495 Sick sinus syndrome: Secondary | ICD-10-CM | POA: Diagnosis not present

## 2017-04-08 DIAGNOSIS — I482 Chronic atrial fibrillation: Secondary | ICD-10-CM | POA: Diagnosis not present

## 2017-04-08 DIAGNOSIS — E876 Hypokalemia: Secondary | ICD-10-CM | POA: Diagnosis not present

## 2017-04-08 DIAGNOSIS — R5383 Other fatigue: Secondary | ICD-10-CM | POA: Diagnosis not present

## 2017-04-10 ENCOUNTER — Ambulatory Visit
Admission: RE | Admit: 2017-04-10 | Discharge: 2017-04-10 | Disposition: A | Discharge status: Home / Self Care | Payer: Medicare HMO | Source: Ambulatory Visit | Attending: Endocrinology | Admitting: Endocrinology

## 2017-04-10 DIAGNOSIS — Z1231 Encounter for screening mammogram for malignant neoplasm of breast: Secondary | ICD-10-CM

## 2017-05-01 DIAGNOSIS — H35362 Drusen (degenerative) of macula, left eye: Secondary | ICD-10-CM | POA: Diagnosis not present

## 2017-05-01 DIAGNOSIS — H34831 Tributary (branch) retinal vein occlusion, right eye, with macular edema: Secondary | ICD-10-CM | POA: Diagnosis not present

## 2017-05-01 DIAGNOSIS — H43813 Vitreous degeneration, bilateral: Secondary | ICD-10-CM | POA: Diagnosis not present

## 2017-05-01 DIAGNOSIS — H3582 Retinal ischemia: Secondary | ICD-10-CM | POA: Diagnosis not present

## 2017-05-09 DIAGNOSIS — H34831 Tributary (branch) retinal vein occlusion, right eye, with macular edema: Secondary | ICD-10-CM | POA: Diagnosis not present

## 2017-06-17 ENCOUNTER — Telehealth: Payer: Self-pay | Admitting: Cardiology

## 2017-06-17 ENCOUNTER — Ambulatory Visit (INDEPENDENT_AMBULATORY_CARE_PROVIDER_SITE_OTHER): Payer: Medicare HMO | Admitting: *Deleted

## 2017-06-17 DIAGNOSIS — I495 Sick sinus syndrome: Secondary | ICD-10-CM | POA: Diagnosis not present

## 2017-06-17 NOTE — Progress Notes (Signed)
Remote pacemaker transmission.

## 2017-06-17 NOTE — Telephone Encounter (Signed)
Spoke with pt and reminded pt of remote transmission that is due today. Pt verbalized understanding.   

## 2017-07-08 DIAGNOSIS — E039 Hypothyroidism, unspecified: Secondary | ICD-10-CM | POA: Diagnosis not present

## 2017-07-08 DIAGNOSIS — Z823 Family history of stroke: Secondary | ICD-10-CM | POA: Diagnosis not present

## 2017-07-08 DIAGNOSIS — I4891 Unspecified atrial fibrillation: Secondary | ICD-10-CM | POA: Diagnosis not present

## 2017-07-08 DIAGNOSIS — M810 Age-related osteoporosis without current pathological fracture: Secondary | ICD-10-CM | POA: Diagnosis not present

## 2017-07-08 DIAGNOSIS — Z8249 Family history of ischemic heart disease and other diseases of the circulatory system: Secondary | ICD-10-CM | POA: Diagnosis not present

## 2017-07-08 DIAGNOSIS — Z950 Presence of cardiac pacemaker: Secondary | ICD-10-CM | POA: Diagnosis not present

## 2017-07-09 DIAGNOSIS — H35362 Drusen (degenerative) of macula, left eye: Secondary | ICD-10-CM | POA: Diagnosis not present

## 2017-07-09 DIAGNOSIS — H34831 Tributary (branch) retinal vein occlusion, right eye, with macular edema: Secondary | ICD-10-CM | POA: Diagnosis not present

## 2017-07-09 DIAGNOSIS — H43813 Vitreous degeneration, bilateral: Secondary | ICD-10-CM | POA: Diagnosis not present

## 2017-07-24 ENCOUNTER — Telehealth: Payer: Self-pay | Admitting: *Deleted

## 2017-07-24 LAB — CUP PACEART REMOTE DEVICE CHECK
Brady Statistic AP VS Percent: 23 %
Brady Statistic AS VS Percent: 76 %
Date Time Interrogation Session: 20190604155254
Implantable Lead Implant Date: 20150507
Implantable Lead Location: 753859
Implantable Lead Location: 753860
Implantable Lead Model: 5592
Implantable Pulse Generator Implant Date: 20150507
Lead Channel Impedance Value: 607 Ohm
Lead Channel Pacing Threshold Pulse Width: 0.4 ms
Lead Channel Pacing Threshold Pulse Width: 0.4 ms
Lead Channel Setting Pacing Amplitude: 2.5 V
Lead Channel Setting Sensing Sensitivity: 5.6 mV
MDC IDC LEAD IMPLANT DT: 20150507
MDC IDC MSMT BATTERY IMPEDANCE: 184 Ohm
MDC IDC MSMT BATTERY REMAINING LONGEVITY: 134 mo
MDC IDC MSMT BATTERY VOLTAGE: 2.8 V
MDC IDC MSMT LEADCHNL RA IMPEDANCE VALUE: 498 Ohm
MDC IDC MSMT LEADCHNL RA PACING THRESHOLD AMPLITUDE: 0.5 V
MDC IDC MSMT LEADCHNL RV PACING THRESHOLD AMPLITUDE: 0.5 V
MDC IDC SET LEADCHNL RA PACING AMPLITUDE: 2 V
MDC IDC SET LEADCHNL RV PACING PULSEWIDTH: 0.4 ms
MDC IDC STAT BRADY AP VP PERCENT: 0 %
MDC IDC STAT BRADY AS VP PERCENT: 1 %

## 2017-07-24 NOTE — Telephone Encounter (Signed)
Called patient regarding new AFL noted on 6/4 remote transmission per Chanetta Marshall, NP. Patient states that she had some issues with occasional DOE and a "different feeling" when bending over last week, but she states that she has no c/o this week.  I explained the diagnosis of AF/AFL to patient and informed her that she would need an appt with the AF clinic to discuss OACs.  Patient verbalized understanding. Appt for AF clinic scheduled for 7/17 @ 1500. Patient was given instructions on how to get to the AF clinic, as well as the telephone number and entry code. Again, patient verbalized understanding of all information.

## 2017-07-30 ENCOUNTER — Encounter (HOSPITAL_COMMUNITY): Payer: Self-pay | Admitting: Nurse Practitioner

## 2017-07-30 ENCOUNTER — Other Ambulatory Visit (HOSPITAL_COMMUNITY): Payer: Self-pay | Admitting: *Deleted

## 2017-07-30 ENCOUNTER — Ambulatory Visit (HOSPITAL_COMMUNITY)
Admission: RE | Admit: 2017-07-30 | Discharge: 2017-07-30 | Disposition: A | Discharge status: Home / Self Care | Payer: Medicare HMO | Source: Ambulatory Visit | Attending: Nurse Practitioner | Admitting: Nurse Practitioner

## 2017-07-30 VITALS — BP 132/82 | HR 86 | Ht 67.5 in | Wt 153.5 lb

## 2017-07-30 DIAGNOSIS — Z90710 Acquired absence of both cervix and uterus: Secondary | ICD-10-CM | POA: Diagnosis not present

## 2017-07-30 DIAGNOSIS — E039 Hypothyroidism, unspecified: Secondary | ICD-10-CM | POA: Insufficient documentation

## 2017-07-30 DIAGNOSIS — Z79890 Hormone replacement therapy (postmenopausal): Secondary | ICD-10-CM | POA: Diagnosis not present

## 2017-07-30 DIAGNOSIS — Z9841 Cataract extraction status, right eye: Secondary | ICD-10-CM | POA: Insufficient documentation

## 2017-07-30 DIAGNOSIS — I495 Sick sinus syndrome: Secondary | ICD-10-CM | POA: Diagnosis not present

## 2017-07-30 DIAGNOSIS — Z7982 Long term (current) use of aspirin: Secondary | ICD-10-CM | POA: Insufficient documentation

## 2017-07-30 DIAGNOSIS — I480 Paroxysmal atrial fibrillation: Secondary | ICD-10-CM | POA: Diagnosis not present

## 2017-07-30 DIAGNOSIS — Z79899 Other long term (current) drug therapy: Secondary | ICD-10-CM | POA: Diagnosis not present

## 2017-07-30 DIAGNOSIS — E785 Hyperlipidemia, unspecified: Secondary | ICD-10-CM | POA: Diagnosis not present

## 2017-07-30 DIAGNOSIS — Z950 Presence of cardiac pacemaker: Secondary | ICD-10-CM | POA: Diagnosis not present

## 2017-07-30 DIAGNOSIS — Z9842 Cataract extraction status, left eye: Secondary | ICD-10-CM | POA: Insufficient documentation

## 2017-07-30 DIAGNOSIS — I4891 Unspecified atrial fibrillation: Secondary | ICD-10-CM | POA: Insufficient documentation

## 2017-07-30 LAB — CBC
HEMATOCRIT: 37.1 % (ref 36.0–46.0)
HEMOGLOBIN: 11.8 g/dL — AB (ref 12.0–15.0)
MCH: 30.8 pg (ref 26.0–34.0)
MCHC: 31.8 g/dL (ref 30.0–36.0)
MCV: 96.9 fL (ref 78.0–100.0)
Platelets: 168 10*3/uL (ref 150–400)
RBC: 3.83 MIL/uL — ABNORMAL LOW (ref 3.87–5.11)
RDW: 17.6 % — ABNORMAL HIGH (ref 11.5–15.5)
WBC: 5.3 10*3/uL (ref 4.0–10.5)

## 2017-07-30 LAB — BASIC METABOLIC PANEL
Anion gap: 8 (ref 5–15)
BUN: 17 mg/dL (ref 8–23)
CHLORIDE: 105 mmol/L (ref 98–111)
CO2: 26 mmol/L (ref 22–32)
CREATININE: 1.11 mg/dL — AB (ref 0.44–1.00)
Calcium: 9.5 mg/dL (ref 8.9–10.3)
GFR calc Af Amer: 52 mL/min — ABNORMAL LOW (ref >60)
GFR calc non Af Amer: 45 mL/min — ABNORMAL LOW (ref >60)
Glucose, Bld: 104 mg/dL — ABNORMAL HIGH (ref 70–99)
Potassium: 4.4 mmol/L (ref 3.5–5.1)
Sodium: 139 mmol/L (ref 135–145)

## 2017-07-30 MED ORDER — APIXABAN 5 MG PO TABS: 5.0000 mg | ORAL_TABLET | Freq: Two times a day (BID) | ORAL | 0 refills | Status: DC

## 2017-07-30 NOTE — Progress Notes (Signed)
Primary Care Physician: Anda Kraft, MD Referring Physician: Chanetta Marshall, NP  EP: Dr. Arnold Long Vital is a 84 y.o. female with a h/o syncope, SSS, s/p PPM 01/2016. A recent remote check, 6/4, showed presence of afib with the recommendation from Chanetta Marshall, NP, that the pt f/u in the afib clinic for initiation of anticoagulation. Pt denies a bleeding history. No further syncope. She is in rate controlled afib today but is unaware. She is here with her son. She describes some shortness of breath with activity but his has been chronic.She states that she sees a urologist for some blood in her urine but not  visible to the naked eye, by her description it sounds more microscopic, per Epic she has had a prior bladder lesion.  Today, she denies symptoms of palpitations, chest pain, shortness of breath, orthopnea, PND, lower extremity edema, dizziness, presyncope, syncope, or neurologic sequela. The patient is tolerating medications without difficulties and is otherwise without complaint today.   Past Medical History:  Diagnosis Date  . Arthritis    ddd-- occ. pain  . Cholelithiasis   . Eczema   . Goiter   . HLD (hyperlipidemia)   . Hx of cardiovascular stress test    a. ETT-MV 7/14:  Low risk, ant defect c/w soft tissue atten., no ischemia, EF 77%  . Hypothyroid   . Lesion of bladder    hematuria/ frequency  . Paroxysmal atrial fibrillation ([place]) 01/2016   cahds2vasc sore is 3.  consider anticoagulation if afib burden increases  . SDH (subdural hematoma) (HCC)    in the setting of syncope and fall prior to PPM implant, now resolved  . Sick sinus syndrome ([place])   . Syncope    pauses of >4 seconds   Past Surgical History:  Procedure Laterality Date  . ABDOMINAL HYSTERECTOMY  age 89  . APPENDECTOMY  age 6  . bilateral salpingoophectomy  age 29  . CATARACT EXTRACTION W/ INTRAOCULAR LENS  IMPLANT, BILATERAL  2012  . CYSTOSCOPY WITH BIOPSY  11/21/2010   Procedure: CYSTOSCOPY  WITH BIOPSY;  Surgeon: Molli Hazard, MD;  Location: [facility];  Service: Urology;  Laterality: N/A;  CYSTOSCOPY WITH BLADDER BIOPSY  AND INSTILLATION OF INDIGO CARMINE  . CYSTOSCOPY/RETROGRADE/URETEROSCOPY  11/21/2010   Procedure: CYSTOSCOPY/RETROGRADE/URETEROSCOPY;  Surgeon: Molli Hazard, MD;  Location: [facility];  Service: Urology;  Laterality: Left;  . gallstones removed  age 63  . LOOP RECORDER IMPLANT N/A 12/31/2012   Procedure: LOOP RECORDER IMPLANT;  Surgeon: Coralyn Mark, MD;  Location: [facility] CATH LAB;  Service: Cardiovascular;  Laterality: N/A;  . PACEMAKER INSERTION  05-20-2013   MDT ADDRL1 pacemaker implanted by Dr Rayann Heman for SSS and symptomatic bradycardia  . PERMANENT PACEMAKER INSERTION Left 05/20/2013   Procedure: PERMANENT PACEMAKER INSERTION;  Surgeon: Coralyn Mark, MD;  Location: [place] CATH LAB;  Service: Cardiovascular;  Laterality: Left;    Current Outpatient Medications  Medication Sig Dispense Refill  . aspirin 81 MG tablet Take 81 mg by mouth daily.    . betamethasone valerate ointment (VALISONE) 0.1 % Apply 1 application topically daily as needed (eczema).     . cycloSPORINE (RESTASIS) 0.05 % ophthalmic emulsion Place 1 drop into both eyes 2 (two) times daily.     Marland Kitchen levothyroxine (SYNTHROID, LEVOTHROID) 88 MCG tablet Take 88 mcg by mouth every morning.     . metoprolol succinate (TOPROL-XL) 25 MG 24 hr tablet Take 1 tablet (25 mg  total) by mouth daily. Take with or immediately following a meal. 90 tablet 3  . potassium citrate (UROCIT-K) 10 MEQ (1080 MG) SR tablet Take 10 mEq by mouth 2 (two) times daily.    Marland Kitchen tobramycin (TOBREX) 0.3 % ophthalmic solution Place 1 drop into the right eye See admin instructions. Once monthly, administer 1 drop into right eye four times a day on the day prior to your eye treatment procedure at [facility] and for 1 day after the eye procedure. Repeat this monthly x 4 more treatments.      Marland Kitchen VITAMIN E PO Take 1 tablet by mouth daily.     No current facility-administered medications for this encounter.     Allergies  Allergen Reactions  . Acetaminophen Other (See Comments)    Syncope  . Meclizine Other (See Comments)    Increased dizziness and confusion    Social History   Socioeconomic History  . Marital status: Married    Spouse name: Not on file  . Number of children: Not on file  . Years of education: Not on file  . Highest education level: Not on file  Occupational History  . Not on file  Social Needs  . Financial resource strain: Not on file  . Food insecurity:    Worry: Not on file    Inability: Not on file  . Transportation needs:    Medical: Not on file    Non-medical: Not on file  Tobacco Use  . Smoking status: Never Smoker  . Smokeless tobacco: Never Used  . Tobacco comment: tobacco use - no  Substance and Sexual Activity  . Alcohol use: No  . Drug use: No  . Sexual activity: Not on file  Lifestyle  . Physical activity:    Days per week: Not on file    Minutes per session: Not on file  . Stress: Not on file  Relationships  . Social connections:    Talks on phone: Not on file    Gets together: Not on file    Attends religious service: Not on file    Active member of club or organization: Not on file    Attends meetings of clubs or organizations: Not on file    Relationship status: Not on file  . Intimate partner violence:    Fear of current or ex partner: Not on file    Emotionally abused: Not on file    Physically abused: Not on file    Forced sexual activity: Not on file  Other Topics Concern  . Not on file  Social History Narrative   Married, pt time Control and instrumentation engineer.     Family History  Problem Relation Age of Onset  . Congestive Heart Failure Father   . Heart disease Father   . Stroke Mother     ROS- All systems are reviewed and negative except as per the HPI above  Physical Exam: Vitals:   07/30/17 1441  BP:  132/82  Pulse: 86  SpO2: 97%  Weight: 153 lb 8 oz (69.6 kg)  Height: 5' 7.5" (1.715 m)   Wt Readings from Last 3 Encounters:  07/30/17 153 lb 8 oz (69.6 kg)  09/18/16 158 lb 12.8 oz (72 kg)  07/29/16 157 lb (71.2 kg)    Labs: Lab Results  Component Value Date   NA 139 07/30/2017   K 4.4 07/30/2017   CL 105 07/30/2017   CO2 26 07/30/2017   GLUCOSE 104 (H) 07/30/2017   BUN  17 07/30/2017   CREATININE 1.11 (H) 07/30/2017   CALCIUM 9.5 07/30/2017   No results found for: INR No results found for: CHOL, HDL, LDLCALC, TRIG   GEN- The patient is well appearing, alert and oriented x 3 today.   Head- normocephalic, atraumatic Eyes-  Sclera clear, conjunctiva pink Ears- hearing intact Oropharynx- clear Neck- supple, no JVP Lymph- no cervical lymphadenopathy Lungs- Clear to ausculation bilaterally, normal work of breathing Heart- irregular rate and rhythm, no murmurs, rubs or gallops, PMI not laterally displaced GI- soft, NT, ND, + BS Extremities- no clubbing, cyanosis, or edema MS- no significant deformity or atrophy Skin- no rash or lesion Psych- euthymic mood, full affect Neuro- strength and sensation are intact  EKG-afib at 85 bpm, qrs int 90 ms, qtc 416 ms Epic records reviewed    Assessment and Plan: 1. Atrial fib  General education re afib Currently in afib, rate controlled and is asymptomatic Continue remote monitoring Continue metoprolol 25 mg bid  2. CHA2DS2VASc score of 3 Denies bleeding history/risk Does have prior h/o lesion of bladder but pt is not noticing any current blood in urine Bmet/cbc today shows  creatine at 1.11, with weight at 153,age of 85,she will still qualify for 5 mg eliquis bid Bleeding precautions discussed CBC shows she is chronically anemic with HGB at 11.8 and RBC 3.83, similar to 4 years ago, last lab in Epic  Will see back in one month  Butch Penny C. Zyra Parrillo, [facility] [address] [place],  [place] [address] [phone]

## 2017-07-31 DIAGNOSIS — I482 Chronic atrial fibrillation: Secondary | ICD-10-CM | POA: Diagnosis not present

## 2017-08-20 DIAGNOSIS — N200 Calculus of kidney: Secondary | ICD-10-CM | POA: Diagnosis not present

## 2017-08-28 ENCOUNTER — Ambulatory Visit (HOSPITAL_COMMUNITY)
Admission: RE | Admit: 2017-08-28 | Discharge: 2017-08-28 | Disposition: A | Discharge status: Home / Self Care | Payer: Medicare HMO | Source: Ambulatory Visit | Attending: Nurse Practitioner | Admitting: Nurse Practitioner

## 2017-08-28 ENCOUNTER — Encounter (HOSPITAL_COMMUNITY): Payer: Self-pay | Admitting: Nurse Practitioner

## 2017-08-28 VITALS — BP 120/60 | HR 98 | Ht 67.5 in | Wt 153.6 lb

## 2017-08-28 DIAGNOSIS — I4891 Unspecified atrial fibrillation: Secondary | ICD-10-CM | POA: Insufficient documentation

## 2017-08-28 DIAGNOSIS — I495 Sick sinus syndrome: Secondary | ICD-10-CM | POA: Diagnosis not present

## 2017-08-28 DIAGNOSIS — I481 Persistent atrial fibrillation: Secondary | ICD-10-CM | POA: Diagnosis not present

## 2017-08-28 DIAGNOSIS — E785 Hyperlipidemia, unspecified: Secondary | ICD-10-CM | POA: Insufficient documentation

## 2017-08-28 DIAGNOSIS — N329 Bladder disorder, unspecified: Secondary | ICD-10-CM | POA: Diagnosis not present

## 2017-08-28 DIAGNOSIS — I4892 Unspecified atrial flutter: Secondary | ICD-10-CM | POA: Diagnosis not present

## 2017-08-28 DIAGNOSIS — I4819 Other persistent atrial fibrillation: Secondary | ICD-10-CM

## 2017-08-28 DIAGNOSIS — E039 Hypothyroidism, unspecified: Secondary | ICD-10-CM | POA: Insufficient documentation

## 2017-08-28 DIAGNOSIS — I4439 Other atrioventricular block: Secondary | ICD-10-CM | POA: Diagnosis not present

## 2017-08-28 DIAGNOSIS — Z950 Presence of cardiac pacemaker: Secondary | ICD-10-CM | POA: Insufficient documentation

## 2017-08-28 DIAGNOSIS — Z79899 Other long term (current) drug therapy: Secondary | ICD-10-CM | POA: Insufficient documentation

## 2017-08-28 LAB — BASIC METABOLIC PANEL
Anion gap: 7 (ref 5–15)
BUN: 16 mg/dL (ref 8–23)
CHLORIDE: 106 mmol/L (ref 98–111)
CO2: 26 mmol/L (ref 22–32)
Calcium: 9.4 mg/dL (ref 8.9–10.3)
Creatinine, Ser: 1.1 mg/dL — ABNORMAL HIGH (ref 0.44–1.00)
GFR calc non Af Amer: 45 mL/min — ABNORMAL LOW (ref >60)
GFR, EST AFRICAN AMERICAN: 52 mL/min — AB (ref >60)
Glucose, Bld: 90 mg/dL (ref 70–99)
Potassium: 3.9 mmol/L (ref 3.5–5.1)
Sodium: 139 mmol/L (ref 135–145)

## 2017-08-28 LAB — CBC
HEMATOCRIT: 34 % — AB (ref 36.0–46.0)
HEMOGLOBIN: 11.2 g/dL — AB (ref 12.0–15.0)
MCH: 31.2 pg (ref 26.0–34.0)
MCHC: 32.9 g/dL (ref 30.0–36.0)
MCV: 94.7 fL (ref 78.0–100.0)
Platelets: 151 10*3/uL (ref 150–400)
RBC: 3.59 MIL/uL — ABNORMAL LOW (ref 3.87–5.11)
RDW: 17.2 % — ABNORMAL HIGH (ref 11.5–15.5)
WBC: 4.6 10*3/uL (ref 4.0–10.5)

## 2017-08-28 MED ORDER — APIXABAN 5 MG PO TABS: 5.0000 mg | ORAL_TABLET | Freq: Two times a day (BID) | ORAL | 6 refills | Status: DC

## 2017-08-28 NOTE — Progress Notes (Signed)
Primary Care Physician: Anda Kraft, MD Referring Physician: Chanetta Marshall, NP  EP: Dr. Arnold Long Parkinson is a 84 y.o. female with a h/o syncope, SSS, s/p PPM 01/2016. A recent remote check, 6/4, showed presence of afib with the recommendation from Chanetta Marshall, NP, that the pt f/u in the afib clinic for initiation of anticoagulation. Pt denies a bleeding history. No further syncope. She is in rate controlled afib today but is unaware. She is here with her son. She describes some shortness of breath with activity but his has been chronic.She states that she sees a urologist for some blood in her urine but not  visible to the naked eye, by her description it sounds more microscopic, per Epic she has had a prior bladder lesion.  F/u one month in afib clinic, she remains in rate controlled afib. States that she feels well. Does not notice any irregular heart beat. Has not noted any bleeding. No side effects that she has noted from eliquis. Weight is stable.  Today, she denies symptoms of palpitations, chest pain, shortness of breath, orthopnea, PND, lower extremity edema, dizziness, presyncope, syncope, or neurologic sequela. The patient is tolerating medications without difficulties and is otherwise without complaint today.   Past Medical History:  Diagnosis Date  . Arthritis    ddd-- occ. pain  . Cholelithiasis   . Eczema   . Goiter   . HLD (hyperlipidemia)   . Hx of cardiovascular stress test    a. ETT-MV 7/14:  Low risk, ant defect c/w soft tissue atten., no ischemia, EF 77%  . Hypothyroid   . Lesion of bladder    hematuria/ frequency  . Paroxysmal atrial fibrillation ([place]) 01/2016   cahds2vasc sore is 3.  consider anticoagulation if afib burden increases  . SDH (subdural hematoma) (HCC)    in the setting of syncope and fall prior to PPM implant, now resolved  . Sick sinus syndrome ([place])   . Syncope    pauses of >4 seconds   Past Surgical History:  Procedure Laterality  Date  . ABDOMINAL HYSTERECTOMY  age 58  . APPENDECTOMY  age 57  . bilateral salpingoophectomy  age 84  . CATARACT EXTRACTION W/ INTRAOCULAR LENS  IMPLANT, BILATERAL  2012  . CYSTOSCOPY WITH BIOPSY  11/21/2010   Procedure: CYSTOSCOPY WITH BIOPSY;  Surgeon: Molli Hazard, MD;  Location: [facility];  Service: Urology;  Laterality: N/A;  CYSTOSCOPY WITH BLADDER BIOPSY  AND INSTILLATION OF INDIGO CARMINE  . CYSTOSCOPY/RETROGRADE/URETEROSCOPY  11/21/2010   Procedure: CYSTOSCOPY/RETROGRADE/URETEROSCOPY;  Surgeon: Molli Hazard, MD;  Location: [facility];  Service: Urology;  Laterality: Left;  . gallstones removed  age 41  . LOOP RECORDER IMPLANT N/A 12/31/2012   Procedure: LOOP RECORDER IMPLANT;  Surgeon: Coralyn Mark, MD;  Location: [place] CATH LAB;  Service: Cardiovascular;  Laterality: N/A;  . PACEMAKER INSERTION  05-20-2013   MDT ADDRL1 pacemaker implanted by Dr Rayann Heman for SSS and symptomatic bradycardia  . PERMANENT PACEMAKER INSERTION Left 05/20/2013   Procedure: PERMANENT PACEMAKER INSERTION;  Surgeon: Coralyn Mark, MD;  Location: [place] CATH LAB;  Service: Cardiovascular;  Laterality: Left;    Current Outpatient Medications  Medication Sig Dispense Refill  . apixaban (ELIQUIS) 5 MG TABS tablet Take 1 tablet (5 mg total) by mouth 2 (two) times daily. 60 tablet 6  . betamethasone valerate ointment (VALISONE) 0.1 % Apply 1 application topically daily as needed (eczema).     Marland Kitchen levothyroxine (  SYNTHROID, LEVOTHROID) 88 MCG tablet Take 88 mcg by mouth every morning.     . metoprolol succinate (TOPROL-XL) 25 MG 24 hr tablet Take 1 tablet (25 mg total) by mouth daily. Take with or immediately following a meal. 90 tablet 3  . potassium citrate (UROCIT-K) 10 MEQ (1080 MG) SR tablet Take 10 mEq by mouth 2 (two) times daily.    Marland Kitchen tobramycin (TOBREX) 0.3 % ophthalmic solution Place 1 drop into the right eye See admin instructions. Once monthly, administer 1 drop into  right eye four times a day on the day prior to your eye treatment procedure at [facility] and for 1 day after the eye procedure. Repeat this monthly x 4 more treatments.    . cycloSPORINE (RESTASIS) 0.05 % ophthalmic emulsion Place 1 drop into both eyes 2 (two) times daily.      No current facility-administered medications for this encounter.     Allergies  Allergen Reactions  . Acetaminophen Other (See Comments)    Syncope  . Meclizine Other (See Comments)    Increased dizziness and confusion    Social History   Socioeconomic History  . Marital status: Married    Spouse name: Not on file  . Number of children: Not on file  . Years of education: Not on file  . Highest education level: Not on file  Occupational History  . Not on file  Social Needs  . Financial resource strain: Not on file  . Food insecurity:    Worry: Not on file    Inability: Not on file  . Transportation needs:    Medical: Not on file    Non-medical: Not on file  Tobacco Use  . Smoking status: Never Smoker  . Smokeless tobacco: Never Used  . Tobacco comment: tobacco use - no  Substance and Sexual Activity  . Alcohol use: No  . Drug use: No  . Sexual activity: Not on file  Lifestyle  . Physical activity:    Days per week: Not on file    Minutes per session: Not on file  . Stress: Not on file  Relationships  . Social connections:    Talks on phone: Not on file    Gets together: Not on file    Attends religious service: Not on file    Active member of club or organization: Not on file    Attends meetings of clubs or organizations: Not on file    Relationship status: Not on file  . Intimate partner violence:    Fear of current or ex partner: Not on file    Emotionally abused: Not on file    Physically abused: Not on file    Forced sexual activity: Not on file  Other Topics Concern  . Not on file  Social History Narrative   Married, pt time Control and instrumentation engineer.     Family History    Problem Relation Age of Onset  . Congestive Heart Failure Father   . Heart disease Father   . Stroke Mother     ROS- All systems are reviewed and negative except as per the HPI above  Physical Exam: Vitals:   08/28/17 1509  BP: 120/60  Pulse: 98  Weight: 69.7 kg  Height: 5' 7.5" (1.715 m)   Wt Readings from Last 3 Encounters:  08/28/17 69.7 kg  07/30/17 69.6 kg  09/18/16 72 kg    Labs: Lab Results  Component Value Date   NA 139 07/30/2017   K  4.4 07/30/2017   CL 105 07/30/2017   CO2 26 07/30/2017   GLUCOSE 104 (H) 07/30/2017   BUN 17 07/30/2017   CREATININE 1.11 (H) 07/30/2017   CALCIUM 9.5 07/30/2017   No results found for: INR No results found for: CHOL, HDL, LDLCALC, TRIG   GEN- The patient is well appearing, alert and oriented x 3 today.   Head- normocephalic, atraumatic Eyes-  Sclera clear, conjunctiva pink Ears- hearing intact Oropharynx- clear Neck- supple, no JVP Lymph- no cervical lymphadenopathy Lungs- Clear to ausculation bilaterally, normal work of breathing Heart- irregular rate and rhythm, no murmurs, rubs or gallops, PMI not laterally displaced GI- soft, NT, ND, + BS Extremities- no clubbing, cyanosis, or edema MS- no significant deformity or atrophy Skin- no rash or lesion Psych- euthymic mood, full affect Neuro- strength and sensation are intact  EKG-afib at 98 bpm, qrs int 88 ms, qtc 439 ms Epic records reviewed    Assessment and Plan: 1. Atrial fib  Currently in afib, rate controlled and is asymptomatic Continue remote monitoring Continue metoprolol 25 mg bid  2. CHA2DS2VASc score of 3 Denies bleeding history/risk Does have prior h/o lesion of bladder but pt is not noticing any current blood in urine Cbc/bmet today   F/u with Dr. Rayann Heman per recall 9/4, remote check 9/3 afib clinic as needed  Butch Penny C. Carroll, [facility] [address] [place], [place] [address] [phone]

## 2017-09-16 ENCOUNTER — Ambulatory Visit (INDEPENDENT_AMBULATORY_CARE_PROVIDER_SITE_OTHER): Payer: Medicare HMO | Admitting: *Deleted

## 2017-09-16 DIAGNOSIS — I495 Sick sinus syndrome: Secondary | ICD-10-CM | POA: Diagnosis not present

## 2017-09-17 ENCOUNTER — Telehealth: Payer: Self-pay

## 2017-09-17 ENCOUNTER — Ambulatory Visit: Payer: Medicare HMO | Admitting: Internal Medicine

## 2017-09-17 ENCOUNTER — Encounter: Payer: Self-pay | Admitting: Internal Medicine

## 2017-09-17 VITALS — BP 108/84 | HR 74 | Ht 68.0 in | Wt 150.8 lb

## 2017-09-17 DIAGNOSIS — I459 Conduction disorder, unspecified: Secondary | ICD-10-CM

## 2017-09-17 DIAGNOSIS — I480 Paroxysmal atrial fibrillation: Secondary | ICD-10-CM

## 2017-09-17 DIAGNOSIS — I4819 Other persistent atrial fibrillation: Secondary | ICD-10-CM

## 2017-09-17 DIAGNOSIS — I495 Sick sinus syndrome: Secondary | ICD-10-CM

## 2017-09-17 DIAGNOSIS — I481 Persistent atrial fibrillation: Secondary | ICD-10-CM | POA: Diagnosis not present

## 2017-09-17 LAB — CUP PACEART INCLINIC DEVICE CHECK
Battery Remaining Longevity: 133 mo
Brady Statistic AP VS Percent: 18 %
Brady Statistic AS VS Percent: 79 %
Date Time Interrogation Session: 20190904143514
Implantable Lead Implant Date: 20150507
Implantable Lead Implant Date: 20150507
Implantable Lead Location: 753860
Lead Channel Pacing Threshold Amplitude: 0.5 V
Lead Channel Pacing Threshold Amplitude: 0.5 V
Lead Channel Pacing Threshold Amplitude: 0.5 V
Lead Channel Pacing Threshold Pulse Width: 0.4 ms
Lead Channel Pacing Threshold Pulse Width: 0.4 ms
Lead Channel Pacing Threshold Pulse Width: 0.4 ms
Lead Channel Setting Pacing Amplitude: 2.5 V
MDC IDC LEAD LOCATION: 753859
MDC IDC MSMT BATTERY IMPEDANCE: 184 Ohm
MDC IDC MSMT BATTERY VOLTAGE: 2.79 V
MDC IDC MSMT LEADCHNL RA IMPEDANCE VALUE: 504 Ohm
MDC IDC MSMT LEADCHNL RA SENSING INTR AMPL: 2 mV
MDC IDC MSMT LEADCHNL RV IMPEDANCE VALUE: 580 Ohm
MDC IDC MSMT LEADCHNL RV SENSING INTR AMPL: 15.67 mV
MDC IDC PG IMPLANT DT: 20150507
MDC IDC SET LEADCHNL RA PACING AMPLITUDE: 2 V
MDC IDC SET LEADCHNL RV PACING PULSEWIDTH: 0.4 ms
MDC IDC SET LEADCHNL RV SENSING SENSITIVITY: 5.6 mV
MDC IDC STAT BRADY AP VP PERCENT: 0 %
MDC IDC STAT BRADY AS VP PERCENT: 2 %

## 2017-09-17 MED ORDER — METOPROLOL SUCCINATE ER 25 MG PO TB24: 50.0000 mg | ORAL_TABLET | Freq: Every day | ORAL | 3 refills | Status: DC

## 2017-09-17 NOTE — Patient Instructions (Signed)
Medication Instructions:  Your physician has recommended you make the following change in your medication:   1. Increase your Torpol XL to 33m, two tablets, once per day  Labwork: None ordered.  Testing/Procedures: None ordered.  Follow-Up:  Your physician recommends that you schedule a follow-up appointment in:   3 months with RTommye Standard PA  Your physician wants you to follow-up in: 12 months with Dr ANikki Dom You will receive a reminder letter in the mail two months in advance. If you don't receive a letter, please call our office to schedule the follow-up appointment.  Remote monitoring is used to monitor your Pacemaker from home. This monitoring reduces the number of office visits required to check your device to one time per year. It allows [employer] keep an eye on the functioning of your device to ensure it is working properly. You are scheduled for a device check from home on 12/16/2017. You may send your transmission at any time that day. If you have a wireless device, the transmission will be sent automatically. After your physician reviews your transmission, you will receive a postcard with your next transmission date.    Any Other Special Instructions Will Be Listed Below (If Applicable).     If you need a refill on your cardiac medications before your next appointment, please call your pharmacy.

## 2017-09-17 NOTE — Telephone Encounter (Signed)
Spoke w/ Ms. Heidemann who is interested in doing the PREP but needs to participate at the [facility].  I will call her back when I have a start date for a new class to happen there.

## 2017-09-17 NOTE — Progress Notes (Signed)
PCP: Merrilee Seashore, MD   Primary EP:  Dr Rayann Heman  Nancy Meadows is a 84 y.o. female who presents today for routine electrophysiology followup.  Since last being seen in our clinic, the patient reports doing reasonably well.   She has not been very active this summer.  She is interested in becoming more active.   Today, she denies symptoms of palpitations, chest pain, shortness of breath,  lower extremity edema, dizziness, presyncope, or syncope.  The patient is otherwise without complaint today.   Past Medical History:  Diagnosis Date  . Arthritis    ddd-- occ. pain  . Cholelithiasis   . Eczema   . Goiter   . HLD (hyperlipidemia)   . Hx of cardiovascular stress test    a. ETT-MV 7/14:  Low risk, ant defect c/w soft tissue atten., no ischemia, EF 77%  . Hypothyroid   . Lesion of bladder    hematuria/ frequency  . Paroxysmal atrial fibrillation ([place]) 01/2016   cahds2vasc sore is 3.  consider anticoagulation if afib burden increases  . SDH (subdural hematoma) (HCC)    in the setting of syncope and fall prior to PPM implant, now resolved  . Sick sinus syndrome ([place])   . Syncope    pauses of >4 seconds   Past Surgical History:  Procedure Laterality Date  . ABDOMINAL HYSTERECTOMY  age 13  . APPENDECTOMY  age 45  . bilateral salpingoophectomy  age 64  . CATARACT EXTRACTION W/ INTRAOCULAR LENS  IMPLANT, BILATERAL  2012  . CYSTOSCOPY WITH BIOPSY  11/21/2010   Procedure: CYSTOSCOPY WITH BIOPSY;  Surgeon: Molli Hazard, MD;  Location: [facility];  Service: Urology;  Laterality: N/A;  CYSTOSCOPY WITH BLADDER BIOPSY  AND INSTILLATION OF INDIGO CARMINE  . CYSTOSCOPY/RETROGRADE/URETEROSCOPY  11/21/2010   Procedure: CYSTOSCOPY/RETROGRADE/URETEROSCOPY;  Surgeon: Molli Hazard, MD;  Location: [facility];  Service: Urology;  Laterality: Left;  . gallstones removed  age 87  . LOOP RECORDER IMPLANT N/A 12/31/2012   Procedure: LOOP RECORDER  IMPLANT;  Surgeon: Coralyn Mark, MD;  Location: [place] CATH LAB;  Service: Cardiovascular;  Laterality: N/A;  . PACEMAKER INSERTION  05-20-2013   MDT ADDRL1 pacemaker implanted by Dr Rayann Heman for SSS and symptomatic bradycardia  . PERMANENT PACEMAKER INSERTION Left 05/20/2013   Procedure: PERMANENT PACEMAKER INSERTION;  Surgeon: Coralyn Mark, MD;  Location: [place] CATH LAB;  Service: Cardiovascular;  Laterality: Left;    ROS- all systems are reviewed and negative except as per HPI above  Current Outpatient Medications  Medication Sig Dispense Refill  . apixaban (ELIQUIS) 5 MG TABS tablet Take 1 tablet (5 mg total) by mouth 2 (two) times daily. 60 tablet 6  . betamethasone valerate ointment (VALISONE) 0.1 % Apply 1 application topically daily as needed (eczema).     . cycloSPORINE (RESTASIS) 0.05 % ophthalmic emulsion Place 1 drop into both eyes 2 (two) times daily.     Marland Kitchen levothyroxine (SYNTHROID, LEVOTHROID) 88 MCG tablet Take 88 mcg by mouth every morning.     . metoprolol succinate (TOPROL-XL) 25 MG 24 hr tablet Take 1 tablet (25 mg total) by mouth daily. Take with or immediately following a meal. 90 tablet 3  . potassium citrate (UROCIT-K) 10 MEQ (1080 MG) SR tablet Take 10 mEq by mouth 2 (two) times daily.    Marland Kitchen tobramycin (TOBREX) 0.3 % ophthalmic solution Place 1 drop into the right eye See admin instructions. Once monthly, administer 1 drop  into right eye four times a day on the day prior to your eye treatment procedure at [facility] and for 1 day after the eye procedure. Repeat this monthly x 4 more treatments.     No current facility-administered medications for this visit.     Physical Exam: Vitals:   09/17/17 1235  BP: 108/84  Pulse: 74  SpO2: 97%  Weight: 150 lb 12.8 oz (68.4 kg)  Height: _0  (1.727 m)    GEN- The patient is well appearing, alert and oriented x 3 today.   Head- normocephalic, atraumatic Eyes-  Sclera clear, conjunctiva pink Ears- hearing  intact Oropharynx- clear Lungs- Clear to ausculation bilaterally, normal work of breathing Chest- pacemaker pocket is well healed Heart- irregular rate and rhythm, no murmurs, rubs or gallops, PMI not laterally displaced GI- soft, NT, ND, + BS Extremities- no clubbing, cyanosis, or edema  Pacemaker interrogation- reviewed in detail today,  See PACEART report  ekg tracing ordered today is personally reviewed and shows afib, V rate 93 bpm, incomplete RBBB  Assessment and Plan:  1. Symptomatic sinus bradycardia Normal pacemaker function See Pace Art report No changes today  2. Afib/ ectopic atrial tachycardia Arrhythmia burden is 24.8 % (18.8% last visit) asymptomatic chads2vasc score is 3. She is on eliquis Would continue rate control long term V rates elevated Will increase toprol to 70m daily today  [facility] Return to see EP PA in [id number] assess rate response  Will refer to THAndalerogram for exercise.  JaThompson GrayerD, [facility]/04/2017 12:51 PM

## 2017-09-17 NOTE — Progress Notes (Signed)
Remote pacemaker transmission.   

## 2017-09-18 NOTE — Addendum Note (Signed)
Addended by: Rose Phi on: 09/18/2017 03:42 PM   Modules accepted: Orders

## 2017-09-24 DIAGNOSIS — H43813 Vitreous degeneration, bilateral: Secondary | ICD-10-CM | POA: Diagnosis not present

## 2017-09-24 DIAGNOSIS — H34831 Tributary (branch) retinal vein occlusion, right eye, with macular edema: Secondary | ICD-10-CM | POA: Diagnosis not present

## 2017-09-24 DIAGNOSIS — H35362 Drusen (degenerative) of macula, left eye: Secondary | ICD-10-CM | POA: Diagnosis not present

## 2017-09-29 ENCOUNTER — Telehealth: Payer: Self-pay

## 2017-09-29 NOTE — Telephone Encounter (Signed)
Spoke to Nancy Meadows son since she is not home and asked him to have her call me when she gets back home.  Nancy Meadows missed her intake appointment last week and she doesn't have a VM set up on her cellphone.  I found her home number today on her chart.

## 2017-10-02 ENCOUNTER — Telehealth (HOSPITAL_COMMUNITY): Payer: Self-pay | Admitting: Surgery

## 2017-10-02 NOTE — Telephone Encounter (Signed)
Nancy Meadows called concerned about her HR being elevated. Her BP is 138/95. She says that she does not feel particularly bad currently and reports that she has not yet taken her increased dose of Metoprolol (additional 25 mg) as instructed at last appt.  I have instructed her to take that additional medicine.  She will have a follow-up appt in the AFib clinic on Tuesday at 3:30pm.

## 2017-10-07 ENCOUNTER — Ambulatory Visit (HOSPITAL_COMMUNITY)
Admission: RE | Admit: 2017-10-07 | Discharge: 2017-10-07 | Disposition: A | Discharge status: Home / Self Care | Payer: Medicare HMO | Source: Ambulatory Visit | Attending: Nurse Practitioner | Admitting: Nurse Practitioner

## 2017-10-07 ENCOUNTER — Encounter (HOSPITAL_COMMUNITY): Payer: Self-pay | Admitting: Nurse Practitioner

## 2017-10-07 VITALS — BP 132/74 | HR 105 | Ht 68.0 in | Wt 151.0 lb

## 2017-10-07 DIAGNOSIS — Z950 Presence of cardiac pacemaker: Secondary | ICD-10-CM | POA: Insufficient documentation

## 2017-10-07 DIAGNOSIS — E039 Hypothyroidism, unspecified: Secondary | ICD-10-CM | POA: Diagnosis not present

## 2017-10-07 DIAGNOSIS — I482 Chronic atrial fibrillation: Secondary | ICD-10-CM | POA: Diagnosis not present

## 2017-10-07 DIAGNOSIS — Z7901 Long term (current) use of anticoagulants: Secondary | ICD-10-CM | POA: Diagnosis not present

## 2017-10-07 DIAGNOSIS — I481 Persistent atrial fibrillation: Secondary | ICD-10-CM | POA: Diagnosis not present

## 2017-10-07 DIAGNOSIS — E785 Hyperlipidemia, unspecified: Secondary | ICD-10-CM | POA: Insufficient documentation

## 2017-10-07 DIAGNOSIS — R5383 Other fatigue: Secondary | ICD-10-CM | POA: Diagnosis not present

## 2017-10-07 DIAGNOSIS — R0600 Dyspnea, unspecified: Secondary | ICD-10-CM

## 2017-10-07 DIAGNOSIS — J90 Pleural effusion, not elsewhere classified: Secondary | ICD-10-CM

## 2017-10-07 DIAGNOSIS — I4819 Other persistent atrial fibrillation: Secondary | ICD-10-CM

## 2017-10-07 DIAGNOSIS — I495 Sick sinus syndrome: Secondary | ICD-10-CM | POA: Insufficient documentation

## 2017-10-07 DIAGNOSIS — Z79899 Other long term (current) drug therapy: Secondary | ICD-10-CM | POA: Insufficient documentation

## 2017-10-07 DIAGNOSIS — R0602 Shortness of breath: Secondary | ICD-10-CM | POA: Diagnosis not present

## 2017-10-07 NOTE — Progress Notes (Signed)
Primary Care Physician: Merrilee Seashore, MD Referring Physician: Chanetta Marshall, NP  EP: Dr. Arnold Long Nancy Meadows is a 84 y.o. female with a h/o syncope, SSS, s/p PPM 01/2016. A recent remote check, 6/4, showed presence of afib with the recommendation from Chanetta Marshall, NP, that the pt f/u in the afib clinic for initiation of anticoagulation. Pt denies a bleeding history. No further syncope. She is in rate controlled afib today but is unaware. She is here with her son. She describes some shortness of breath with activity but his has been chronic.She states that she sees a urologist for some blood in her urine but not  visible to the naked eye, by her description it sounds more microscopic, per Epic she has had a prior bladder lesion.  F/u one month in afib clinic, she remains in rate controlled afib. States that she feels well. Does not notice any irregular heart beat. Has not noted any bleeding. No side effects that she has noted from eliquis. Weight is stable.  F/u in afib clinic as pt called last week for elevated heart rates and BP. It was discovered that she had not increased her Toprol to 50 mg qd. She was advised to make that changed and f/u today. Her heart rate is reasonably controlled at 105 bpm.  She states that about the time she made the change of BB dose, she felt like she was wheezing and cut her eliquis to once a day. I think the increase of bb helped her symptoms not the decrease of eliquis.  Today, she denies symptoms of palpitations, chest pain, shortness of breath, orthopnea, PND, lower extremity edema, dizziness, presyncope, syncope, or neurologic sequela. The patient is tolerating medications without difficulties and is otherwise without complaint today.   Past Medical History:  Diagnosis Date  . Arthritis    ddd-- occ. pain  . Cholelithiasis   . Eczema   . Goiter   . HLD (hyperlipidemia)   . Hx of cardiovascular stress test    a. ETT-MV 7/14:  Low risk, ant  defect c/w soft tissue atten., no ischemia, EF 77%  . Hypothyroid   . Lesion of bladder    hematuria/ frequency  . Paroxysmal atrial fibrillation ([place]) 01/2016   cahds2vasc sore is 3.  consider anticoagulation if afib burden increases  . SDH (subdural hematoma) (HCC)    in the setting of syncope and fall prior to PPM implant, now resolved  . Sick sinus syndrome ([place])   . Syncope    pauses of >4 seconds   Past Surgical History:  Procedure Laterality Date  . ABDOMINAL HYSTERECTOMY  age 76  . APPENDECTOMY  age 38  . bilateral salpingoophectomy  age 32  . CATARACT EXTRACTION W/ INTRAOCULAR LENS  IMPLANT, BILATERAL  2012  . CYSTOSCOPY WITH BIOPSY  11/21/2010   Procedure: CYSTOSCOPY WITH BIOPSY;  Surgeon: Molli Hazard, MD;  Location: [facility];  Service: Urology;  Laterality: N/A;  CYSTOSCOPY WITH BLADDER BIOPSY  AND INSTILLATION OF INDIGO CARMINE  . CYSTOSCOPY/RETROGRADE/URETEROSCOPY  11/21/2010   Procedure: CYSTOSCOPY/RETROGRADE/URETEROSCOPY;  Surgeon: Molli Hazard, MD;  Location: [facility];  Service: Urology;  Laterality: Left;  . gallstones removed  age 58  . LOOP RECORDER IMPLANT N/A 12/31/2012   Procedure: LOOP RECORDER IMPLANT;  Surgeon: Coralyn Mark, MD;  Location: [place] CATH LAB;  Service: Cardiovascular;  Laterality: N/A;  . PACEMAKER INSERTION  05-20-2013   MDT ADDRL1 pacemaker implanted by Dr Rayann Heman  for SSS and symptomatic bradycardia  . PERMANENT PACEMAKER INSERTION Left 05/20/2013   Procedure: PERMANENT PACEMAKER INSERTION;  Surgeon: Coralyn Mark, MD;  Location: [place] CATH LAB;  Service: Cardiovascular;  Laterality: Left;    Current Outpatient Medications  Medication Sig Dispense Refill  . apixaban (ELIQUIS) 5 MG TABS tablet Take 1 tablet (5 mg total) by mouth 2 (two) times daily. 60 tablet 6  . betamethasone valerate ointment (VALISONE) 0.1 % Apply 1 application topically daily as needed (eczema).     . cycloSPORINE (RESTASIS)  0.05 % ophthalmic emulsion Place 1 drop into both eyes 2 (two) times daily.     Marland Kitchen levothyroxine (SYNTHROID, LEVOTHROID) 88 MCG tablet Take 88 mcg by mouth every morning.     . metoprolol succinate (TOPROL-XL) 25 MG 24 hr tablet Take 2 tablets (50 mg total) by mouth daily. Take with or immediately following a meal. 90 tablet 3  . potassium citrate (UROCIT-K) 10 MEQ (1080 MG) SR tablet Take 10 mEq by mouth 2 (two) times daily.    Marland Kitchen tobramycin (TOBREX) 0.3 % ophthalmic solution Place 1 drop into the right eye See admin instructions. Once monthly, administer 1 drop into right eye four times a day on the day prior to your eye treatment procedure at [facility] and for 1 day after the eye procedure. Repeat this monthly x 4 more treatments.     No current facility-administered medications for this encounter.     Allergies  Allergen Reactions  . Acetaminophen Other (See Comments)    Syncope  . Meclizine Other (See Comments)    Increased dizziness and confusion    Social History   Socioeconomic History  . Marital status: Married    Spouse name: Not on file  . Number of children: Not on file  . Years of education: Not on file  . Highest education level: Not on file  Occupational History  . Not on file  Social Needs  . Financial resource strain: Not on file  . Food insecurity:    Worry: Not on file    Inability: Not on file  . Transportation needs:    Medical: Not on file    Non-medical: Not on file  Tobacco Use  . Smoking status: Never Smoker  . Smokeless tobacco: Never Used  . Tobacco comment: tobacco use - no  Substance and Sexual Activity  . Alcohol use: No  . Drug use: No  . Sexual activity: Not on file  Lifestyle  . Physical activity:    Days per week: Not on file    Minutes per session: Not on file  . Stress: Not on file  Relationships  . Social connections:    Talks on phone: Not on file    Gets together: Not on file    Attends religious service: Not on  file    Active member of club or organization: Not on file    Attends meetings of clubs or organizations: Not on file    Relationship status: Not on file  . Intimate partner violence:    Fear of current or ex partner: Not on file    Emotionally abused: Not on file    Physically abused: Not on file    Forced sexual activity: Not on file  Other Topics Concern  . Not on file  Social History Narrative   Married, pt time Control and instrumentation engineer.     Family History  Problem Relation Age of Onset  . Congestive Heart Failure Father   .  Heart disease Father   . Stroke Mother     ROS- All systems are reviewed and negative except as per the HPI above  Physical Exam: Vitals:   10/07/17 1548  BP: 132/74  Pulse: (!) 105  Weight: 68.5 kg  Height: _0  (1.727 m)   Wt Readings from Last 3 Encounters:  10/07/17 68.5 kg  09/17/17 68.4 kg  08/28/17 69.7 kg    Labs: Lab Results  Component Value Date   NA 139 08/28/2017   K 3.9 08/28/2017   CL 106 08/28/2017   CO2 26 08/28/2017   GLUCOSE 90 08/28/2017   BUN 16 08/28/2017   CREATININE 1.10 (H) 08/28/2017   CALCIUM 9.4 08/28/2017   No results found for: INR No results found for: CHOL, HDL, LDLCALC, TRIG   GEN- The patient is well appearing, alert and oriented x 3 today.   Head- normocephalic, atraumatic Eyes-  Sclera clear, conjunctiva pink Ears- hearing intact Oropharynx- clear Neck- supple, no JVP Lymph- no cervical lymphadenopathy Lungs- Clear to ausculation bilaterally, normal work of breathing Heart- irregular rate and rhythm, no murmurs, rubs or gallops, PMI not laterally displaced GI- soft, NT, ND, + BS Extremities- no clubbing, cyanosis, or edema MS- no significant deformity or atrophy Skin- no rash or lesion Psych- euthymic mood, full affect Neuro- strength and sensation are intact  EKG-afib at 98 bpm, qrs int 88 ms, qtc 439 ms Epic records reviewed    Assessment and Plan: 1. Atrial fib  Persistently in afib  now Continue with increase of Toprol to 50 mg a day Continue remote monitoring  2. CHA2DS2VASc score of 3 Denies bleeding history/risk Does have prior h/o lesion of bladder but pt is not noticing any current blood in urine She recently cut eliquis to one pill a day 2/2 wheezing, thinking it was a side effect Instructed to increase back to bid to protect against stroke, I think her wheezing improved as she controlled her v rate better with increase of BB But will get cxr today  Remote check 10/3, f/u Marinus Maw, PA 12/3 as scheduled afib clinic as needed  Butch Penny C. Malikiah Debarr, [facility] [address] [place], [place] [address] [phone]

## 2017-10-08 LAB — CUP PACEART REMOTE DEVICE CHECK
Battery Impedance: 184 Ohm
Brady Statistic AP VP Percent: 0 %
Brady Statistic AP VS Percent: 18 %
Brady Statistic AS VP Percent: 2 %
Brady Statistic AS VS Percent: 79 %
Date Time Interrogation Session: 20190903144614
Implantable Lead Implant Date: 20150507
Implantable Lead Implant Date: 20150507
Implantable Lead Location: 753860
Implantable Pulse Generator Implant Date: 20150507
Lead Channel Impedance Value: 651 Ohm
Lead Channel Setting Pacing Amplitude: 2.5 V
Lead Channel Setting Pacing Pulse Width: 0.4 ms
Lead Channel Setting Sensing Sensitivity: 4 mV
MDC IDC LEAD LOCATION: 753859
MDC IDC MSMT BATTERY REMAINING LONGEVITY: 134 mo
MDC IDC MSMT BATTERY VOLTAGE: 2.79 V
MDC IDC MSMT LEADCHNL RA IMPEDANCE VALUE: 513 Ohm
MDC IDC MSMT LEADCHNL RA PACING THRESHOLD AMPLITUDE: 0.5 V
MDC IDC MSMT LEADCHNL RA PACING THRESHOLD PULSEWIDTH: 0.4 ms
MDC IDC MSMT LEADCHNL RV PACING THRESHOLD AMPLITUDE: 0.5 V
MDC IDC MSMT LEADCHNL RV PACING THRESHOLD PULSEWIDTH: 0.4 ms
MDC IDC SET LEADCHNL RA PACING AMPLITUDE: 2 V

## 2017-10-22 ENCOUNTER — Other Ambulatory Visit: Payer: Self-pay

## 2017-10-22 ENCOUNTER — Inpatient Hospital Stay (HOSPITAL_COMMUNITY)
Admission: EM | Admit: 2017-10-22 | Discharge: 2017-10-24 | DRG: 189 | Disposition: A | Discharge status: Home Health | Payer: Medicare HMO | Attending: Internal Medicine | Admitting: Internal Medicine

## 2017-10-22 ENCOUNTER — Other Ambulatory Visit (HOSPITAL_COMMUNITY): Payer: Self-pay | Admitting: Internal Medicine

## 2017-10-22 ENCOUNTER — Encounter (HOSPITAL_COMMUNITY): Payer: Self-pay

## 2017-10-22 ENCOUNTER — Ambulatory Visit (HOSPITAL_COMMUNITY)
Admission: RE | Admit: 2017-10-22 | Discharge: 2017-10-22 | Disposition: A | Discharge status: Home / Self Care | Payer: Medicare HMO | Source: Ambulatory Visit | Attending: Internal Medicine | Admitting: Internal Medicine

## 2017-10-22 DIAGNOSIS — Z950 Presence of cardiac pacemaker: Secondary | ICD-10-CM

## 2017-10-22 DIAGNOSIS — Z823 Family history of stroke: Secondary | ICD-10-CM | POA: Diagnosis not present

## 2017-10-22 DIAGNOSIS — T45516A Underdosing of anticoagulants, initial encounter: Secondary | ICD-10-CM | POA: Diagnosis present

## 2017-10-22 DIAGNOSIS — I340 Nonrheumatic mitral (valve) insufficiency: Secondary | ICD-10-CM | POA: Diagnosis present

## 2017-10-22 DIAGNOSIS — I4821 Permanent atrial fibrillation: Secondary | ICD-10-CM | POA: Diagnosis not present

## 2017-10-22 DIAGNOSIS — I4891 Unspecified atrial fibrillation: Secondary | ICD-10-CM

## 2017-10-22 DIAGNOSIS — R0902 Hypoxemia: Secondary | ICD-10-CM

## 2017-10-22 DIAGNOSIS — J9811 Atelectasis: Secondary | ICD-10-CM | POA: Diagnosis not present

## 2017-10-22 DIAGNOSIS — E039 Hypothyroidism, unspecified: Secondary | ICD-10-CM | POA: Diagnosis present

## 2017-10-22 DIAGNOSIS — R0600 Dyspnea, unspecified: Secondary | ICD-10-CM

## 2017-10-22 DIAGNOSIS — J9600 Acute respiratory failure, unspecified whether with hypoxia or hypercapnia: Secondary | ICD-10-CM | POA: Diagnosis present

## 2017-10-22 DIAGNOSIS — I495 Sick sinus syndrome: Secondary | ICD-10-CM | POA: Diagnosis present

## 2017-10-22 DIAGNOSIS — Z8249 Family history of ischemic heart disease and other diseases of the circulatory system: Secondary | ICD-10-CM | POA: Diagnosis not present

## 2017-10-22 DIAGNOSIS — D649 Anemia, unspecified: Secondary | ICD-10-CM | POA: Diagnosis present

## 2017-10-22 DIAGNOSIS — J9601 Acute respiratory failure with hypoxia: Principal | ICD-10-CM | POA: Diagnosis present

## 2017-10-22 DIAGNOSIS — E785 Hyperlipidemia, unspecified: Secondary | ICD-10-CM | POA: Diagnosis present

## 2017-10-22 DIAGNOSIS — Z91128 Patient's intentional underdosing of medication regimen for other reason: Secondary | ICD-10-CM | POA: Diagnosis not present

## 2017-10-22 DIAGNOSIS — E876 Hypokalemia: Secondary | ICD-10-CM | POA: Diagnosis not present

## 2017-10-22 DIAGNOSIS — D638 Anemia in other chronic diseases classified elsewhere: Secondary | ICD-10-CM | POA: Diagnosis present

## 2017-10-22 DIAGNOSIS — Z79890 Hormone replacement therapy (postmenopausal): Secondary | ICD-10-CM | POA: Diagnosis not present

## 2017-10-22 DIAGNOSIS — I609 Nontraumatic subarachnoid hemorrhage, unspecified: Secondary | ICD-10-CM | POA: Diagnosis not present

## 2017-10-22 DIAGNOSIS — I480 Paroxysmal atrial fibrillation: Secondary | ICD-10-CM | POA: Diagnosis present

## 2017-10-22 DIAGNOSIS — J90 Pleural effusion, not elsewhere classified: Secondary | ICD-10-CM | POA: Diagnosis present

## 2017-10-22 DIAGNOSIS — R0602 Shortness of breath: Secondary | ICD-10-CM | POA: Diagnosis not present

## 2017-10-22 HISTORY — DX: Pleural effusion, not elsewhere classified: J90

## 2017-10-22 HISTORY — DX: Acute respiratory failure, unspecified whether with hypoxia or hypercapnia: J96.00

## 2017-10-22 HISTORY — DX: Anemia, unspecified: D64.9

## 2017-10-22 LAB — I-STAT TROPONIN, ED: TROPONIN I, POC: 0.02 ng/mL (ref 0.00–0.08)

## 2017-10-22 LAB — CBC WITH DIFFERENTIAL/PLATELET
Abs Immature Granulocytes: 0.02 10*3/uL (ref 0.00–0.07)
Basophils Absolute: 0 10*3/uL (ref 0.0–0.1)
Basophils Relative: 1 %
EOS PCT: 1 %
Eosinophils Absolute: 0 10*3/uL (ref 0.0–0.5)
HCT: 32.7 % — ABNORMAL LOW (ref 36.0–46.0)
HEMOGLOBIN: 10.4 g/dL — AB (ref 12.0–15.0)
Immature Granulocytes: 0 %
LYMPHS PCT: 22 %
Lymphs Abs: 1.5 10*3/uL (ref 0.7–4.0)
MCH: 29.5 pg (ref 26.0–34.0)
MCHC: 31.8 g/dL (ref 30.0–36.0)
MCV: 92.6 fL (ref 80.0–100.0)
Monocytes Absolute: 0.6 10*3/uL (ref 0.1–1.0)
Monocytes Relative: 9 %
Neutro Abs: 4.5 10*3/uL (ref 1.7–7.7)
Neutrophils Relative %: 67 %
Platelets: 173 10*3/uL (ref 150–400)
RBC: 3.53 MIL/uL — ABNORMAL LOW (ref 3.87–5.11)
RDW: 19.4 % — ABNORMAL HIGH (ref 11.5–15.5)
WBC: 6.6 10*3/uL (ref 4.0–10.5)
nRBC: 1.1 % — ABNORMAL HIGH (ref 0.0–0.2)

## 2017-10-22 LAB — HEPATIC FUNCTION PANEL
ALK PHOS: 58 U/L (ref 38–126)
ALT: 41 U/L (ref 0–44)
AST: 36 U/L (ref 15–41)
Albumin: 3.7 g/dL (ref 3.5–5.0)
BILIRUBIN DIRECT: 0.4 mg/dL — AB (ref 0.0–0.2)
Indirect Bilirubin: 1.3 mg/dL — ABNORMAL HIGH (ref 0.3–0.9)
Total Bilirubin: 1.7 mg/dL — ABNORMAL HIGH (ref 0.3–1.2)
Total Protein: 6.9 g/dL (ref 6.5–8.1)

## 2017-10-22 LAB — BRAIN NATRIURETIC PEPTIDE: B Natriuretic Peptide: 860.4 pg/mL — ABNORMAL HIGH (ref 0.0–100.0)

## 2017-10-22 LAB — BASIC METABOLIC PANEL
Anion gap: 9 (ref 5–15)
BUN: 16 mg/dL (ref 8–23)
CALCIUM: 9.5 mg/dL (ref 8.9–10.3)
CO2: 23 mmol/L (ref 22–32)
Chloride: 105 mmol/L (ref 98–111)
Creatinine, Ser: 0.99 mg/dL (ref 0.44–1.00)
GFR, EST AFRICAN AMERICAN: 59 mL/min — AB (ref >60)
GFR, EST NON AFRICAN AMERICAN: 51 mL/min — AB (ref >60)
Glucose, Bld: 124 mg/dL — ABNORMAL HIGH (ref 70–99)
Potassium: 4.6 mmol/L (ref 3.5–5.1)
SODIUM: 137 mmol/L (ref 135–145)

## 2017-10-22 LAB — TROPONIN I

## 2017-10-22 MED ORDER — SODIUM CHLORIDE 0.9 % IV SOLN: 250.0000 mL | INTRAVENOUS | Status: DC | PRN

## 2017-10-22 MED ORDER — LEVALBUTEROL HCL 0.63 MG/3ML IN NEBU: 0.6300 mg | INHALATION_SOLUTION | Freq: Four times a day (QID) | RESPIRATORY_TRACT | Status: DC | PRN

## 2017-10-22 MED ORDER — FUROSEMIDE 10 MG/ML IJ SOLN
40.0000 mg | Freq: Two times a day (BID) | INTRAMUSCULAR | Status: DC
  Administered 2017-10-23 – 2017-10-24 (×3): 40 mg via INTRAVENOUS
  Filled 2017-10-22 (×3): qty 4

## 2017-10-22 MED ORDER — METOPROLOL SUCCINATE ER 50 MG PO TB24
50.0000 mg | ORAL_TABLET | Freq: Every day | ORAL | Status: DC
  Administered 2017-10-23 – 2017-10-24 (×2): 50 mg via ORAL
  Filled 2017-10-22 (×2): qty 1

## 2017-10-22 MED ORDER — SODIUM CHLORIDE 0.9% FLUSH
3.0000 mL | Freq: Two times a day (BID) | INTRAVENOUS | Status: DC
  Administered 2017-10-22 – 2017-10-23 (×3): 3 mL via INTRAVENOUS

## 2017-10-22 MED ORDER — SODIUM CHLORIDE 0.9 % IJ SOLN
INTRAMUSCULAR | Status: AC
  Filled 2017-10-22: qty 50

## 2017-10-22 MED ORDER — POLYETHYLENE GLYCOL 3350 17 G PO PACK: 17.0000 g | PACK | Freq: Every day | ORAL | Status: DC | PRN

## 2017-10-22 MED ORDER — ONDANSETRON HCL 4 MG/2ML IJ SOLN: 4.0000 mg | Freq: Four times a day (QID) | INTRAMUSCULAR | Status: DC | PRN

## 2017-10-22 MED ORDER — APIXABAN 5 MG PO TABS
5.0000 mg | ORAL_TABLET | Freq: Two times a day (BID) | ORAL | Status: DC
  Administered 2017-10-22 – 2017-10-24 (×4): 5 mg via ORAL
  Filled 2017-10-22 (×4): qty 1

## 2017-10-22 MED ORDER — LEVOTHYROXINE SODIUM 88 MCG PO TABS
88.0000 ug | ORAL_TABLET | ORAL | Status: DC
  Administered 2017-10-23 – 2017-10-24 (×2): 88 ug via ORAL
  Filled 2017-10-22 (×2): qty 1

## 2017-10-22 MED ORDER — IOPAMIDOL (ISOVUE-370) INJECTION 76%
INTRAVENOUS | Status: AC
  Filled 2017-10-22: qty 100

## 2017-10-22 MED ORDER — TOBRAMYCIN 0.3 % OP SOLN: 1.0000 [drp] | OPHTHALMIC | Status: DC

## 2017-10-22 MED ORDER — SODIUM CHLORIDE 0.9% FLUSH: 3.0000 mL | INTRAVENOUS | Status: DC | PRN

## 2017-10-22 MED ORDER — ONDANSETRON HCL 4 MG PO TABS: 4.0000 mg | ORAL_TABLET | Freq: Four times a day (QID) | ORAL | Status: DC | PRN

## 2017-10-22 MED ORDER — IOPAMIDOL (ISOVUE-370) INJECTION 76%
100.0000 mL | Freq: Once | INTRAVENOUS | Status: AC | PRN
  Administered 2017-10-22: 100 mL via INTRAVENOUS

## 2017-10-22 MED ORDER — METOPROLOL TARTRATE 5 MG/5ML IV SOLN
5.0000 mg | Freq: Once | INTRAVENOUS | Status: AC
  Administered 2017-10-22: 5 mg via INTRAVENOUS
  Filled 2017-10-22: qty 5

## 2017-10-22 MED ORDER — FUROSEMIDE 10 MG/ML IJ SOLN
40.0000 mg | Freq: Once | INTRAMUSCULAR | Status: AC
  Administered 2017-10-22: 40 mg via INTRAVENOUS
  Filled 2017-10-22: qty 4

## 2017-10-22 NOTE — ED Notes (Signed)
Bed: WA20 Expected date:  Expected time:  Means of arrival:  Comments: Triage 2

## 2017-10-22 NOTE — ED Notes (Signed)
ED TO INPATIENT HANDOFF REPORT  Name/Age/Gender Nancy Meadows 84 y.o. female  Code Status Code Status History    Date Active Date Inactive Code Status Order ID Comments User Context   05/20/2013 1514 05/21/2013 1429 Full Code [id number]  Thompson Grayer, MD Inpatient   02/09/2012 0443 02/11/2012 1748 Full Code [id number]  Derrill Kay, MD Inpatient      Home/SNF/Other Home  Chief Complaint abnormal ct scan  Level of Care/Admitting Diagnosis ED Disposition    ED Disposition Condition [facility] Area: [facility] [[id number]]  Level of Care: Telemetry [5]  Admit to tele based on following criteria: Acute CHF  Diagnosis: Acute respiratory failure ([place]) [518.81.ICD-9-CM]  Admitting Physician: Toy Baker [[id number]]  Attending Physician: Toy Baker [[id number]]  Estimated length of stay: past midnight tomorrow  Certification:: I certify this patient will need inpatient services for at least 2 midnights  PT Class (Do Not Modify): Inpatient [101]  PT Acc Code (Do Not Modify): Private [1]       Medical History Past Medical History:  Diagnosis Date  . Arthritis    ddd-- occ. pain  . Cholelithiasis   . Eczema   . Goiter   . HLD (hyperlipidemia)   . Hx of cardiovascular stress test    a. ETT-MV 7/14:  Low risk, ant defect c/w soft tissue atten., no ischemia, EF 77%  . Hypothyroid   . Lesion of bladder    hematuria/ frequency  . Paroxysmal atrial fibrillation ([place]) 01/2016   cahds2vasc sore is 3.  consider anticoagulation if afib burden increases  . SDH (subdural hematoma) (HCC)    in the setting of syncope and fall prior to PPM implant, now resolved  . Sick sinus syndrome ([place])   . Syncope    pauses of >4 seconds    Allergies Allergies  Allergen Reactions  . Acetaminophen Other (See Comments)    Syncope  . Meclizine Other (See Comments)    Increased dizziness and confusion    IV Location/Drains/Wounds Patient  Lines/Drains/Airways Status   Active Line/Drains/Airways    Name:   Placement date:   Placement time:   Site:   Days:   Peripheral IV 10/22/17 Left Antecubital   10/22/17    1721    Antecubital   less than 1          Labs/Imaging Results for orders placed or performed during the hospital encounter of 10/22/17 (from the past 48 hour(s))  Basic metabolic panel     Status: Abnormal   Collection Time: 10/22/17  5:34 PM  Result Value Ref Range   Sodium 137 135 - 145 mmol/L   Potassium 4.6 3.5 - 5.1 mmol/L   Chloride 105 98 - 111 mmol/L   CO2 23 22 - 32 mmol/L   Glucose, Bld 124 (H) 70 - 99 mg/dL   BUN 16 8 - 23 mg/dL   Creatinine, Ser 0.99 0.44 - 1.00 mg/dL   Calcium 9.5 8.9 - 10.3 mg/dL   GFR calc non Af Amer 51 (L) >60 mL/min   GFR calc Af Amer 59 (L) >60 mL/min    Comment: (NOTE) The eGFR has been calculated using the CKD EPI equation. This calculation has not been validated in all clinical situations. eGFR's persistently <60 mL/min signify possible Chronic Kidney Disease.    Anion gap 9 5 - 15    Comment: Performed at [facility], [place] [address], [place], [place] [address]  Brain natriuretic peptide  Status: Abnormal   Collection Time: 10/22/17  5:34 PM  Result Value Ref Range   B Natriuretic Peptide 860.4 (H) 0.0 - 100.0 pg/mL    Comment: Performed at [facility], [place] [address] [place], [place] [address]  I-stat troponin, ED     Status: None   Collection Time: 10/22/17  5:38 PM  Result Value Ref Range   Troponin i, poc 0.02 0.00 - 0.08 ng/mL   Comment 3            Comment: Due to the release kinetics of cTnI, a negative result within the first hours of the onset of symptoms does not rule out myocardial infarction with certainty. If myocardial infarction is still suspected, repeat the test at appropriate intervals.    Ct Angio Chest Pe W Or Wo Contrast  Result Date: 10/22/2017 CLINICAL DATA:  Shortness of breath. EXAM: CT  ANGIOGRAPHY CHEST WITH CONTRAST TECHNIQUE: Multidetector CT imaging of the chest was performed using the standard protocol during bolus administration of intravenous contrast. Multiplanar CT image reconstructions and MIPs were obtained to evaluate the vascular anatomy. CONTRAST:  161m ISOVUE-370 IOPAMIDOL (ISOVUE-370) INJECTION 76% COMPARISON:  CT scan of December 24, 2010. FINDINGS: Cardiovascular: Satisfactory opacification of the pulmonary arteries to the segmental level. No evidence of pulmonary embolism. Mild cardiomegaly is noted. No pericardial effusion. Mediastinum/Nodes: No enlarged mediastinal, hilar, or axillary lymph nodes. Thyroid gland, trachea, and esophagus demonstrate no significant findings. Lungs/Pleura: No pneumothorax is noted. Large bilateral pleural effusions are noted, right greater than left. No significant consolidation is noted. Stable calcified granuloma is noted in right middle lobe. Upper Abdomen: No acute abnormality. Musculoskeletal: No chest wall abnormality. No acute or significant osseous findings. Review of the MIP images confirms the above findings. IMPRESSION: No definite evidence of pulmonary embolus. Large bilateral pleural effusions are noted, right greater than left. Electronically Signed   By: JMarijo Conception M.D.   On: 10/22/2017 16:03   EKG Interpretation  Date/Time:  Wednesday October 22 2017 18:21:24 EDT Ventricular Rate:  115 PR Interval:    QRS Duration: 104 QT Interval:  356 QTC Calculation: 493 R Axis:   -38 Text Interpretation:  Atrial fibrillation Left axis deviation Borderline low voltage, extremity leads Borderline prolonged QT interval Confirmed by HIsla Pence([phone] on 10/22/2017 6:31:24 PM   Pending Labs Unresulted Labs (From admission, onward)    Start     Ordered   10/22/17 1919  Hepatic function panel  Add-on,   R     10/22/17 1918   10/22/17 1918  CBC with Differential/Platelet  Add-on,   R     10/22/17 1917   Signed and Held   Troponin I (q 6hr x 3)  Now then every 6 hours,   R     Signed and Held   SVisual merchandiserand Held  Basic metabolic panel  Daily,   R     Signed and Held   Signed and Held  Magnesium  Tomorrow morning,   R    Comments:  Call MD if <1.5    Signed and Held   Signed and Held  Phosphorus  Tomorrow morning,   R     Signed and Held   Signed and Held  TSH  Once,   R    Comments:  Cancel if already done within 1 month and notify MD    Signed and Held   Signed and Held  Comprehensive metabolic panel  Once,   R  Comments:  Cal MD for K<3.5 or >5.0    Signed and Held   Signed and Held  CBC  Once,   R    Comments:  Call for hg <8.0    Signed and Held          Vitals/Pain Today's Vitals   10/22/17 1945 10/22/17 1951 10/22/17 2000 10/22/17 2015  BP: 117/72 117/72 118/74 (!) 118/94  Pulse: 76 87 63 77  Resp: (!) 34 (!) 26 (!) 36 (!) 31  Temp:      TempSrc:      SpO2: 100% 100% 100% 100%  PainSc:        Isolation Precautions No active isolations  Medications Medications  apixaban (ELIQUIS) tablet 5 mg (has no administration in time range)  furosemide (LASIX) injection 40 mg (40 mg Intravenous Given 10/22/17 1742)  metoprolol tartrate (LOPRESSOR) injection 5 mg (5 mg Intravenous Given 10/22/17 1918)    Mobility walks

## 2017-10-22 NOTE — ED Notes (Signed)
Dr. Lenore Cordia stated to this nurse that the metoprolol was for HR

## 2017-10-22 NOTE — H&P (Signed)
Nancy Meadows UYQ:[id number] DOB: 1932/12/20 DOA: 10/22/2017    PCP: Merrilee Seashore, MD   Outpatient Specialists:   CARDS:  Dr. Rayann Heman   Urology Dr.  Lilian Coma not remember his name  Patient arrived to ER on 10/22/17 at 1616  Patient coming from: home Lives alone,  Son lives close Chief Complaint:  Chief Complaint  Patient presents with  . Shortness of Breath    HPI: Nancy Meadows is a 84 y.o. female with medical history significant of Hypothyroidism , paroxysmal atrial fibrillation history of subdural hematoma sick sinus syndrome status post PPM    Presented with 1 wk persistent shortness of breath some leg swelling. Denies any chest pain. She have had some wheezing. No change in weight. She has not her eliquis today she skipped it this week a few last does was yesterday she thinks it makes her feel bad. she presented to her primary care provider who sent her to get a CAT scan done CT done today showed bilateral pleural effusions.  Patient denies any fevers or chills she has been coughing but no chest pain.  She endorses significant dyspnea with exertion and shortness of breath at rest and minimal exertion patient's respirations went up to 40.  Regarding pertinent Chronic problems: History of atrial fibrillation on Eliquis CHA2DS2VASc score of 3 And Toprol Last echogram in 2014 showing mild mitral regurgitation and preserved EF For symptomatic sinus bradycardia with syncope status post pacemaker in 2018 History of bladder lesions followed by urology  Patient received 40 IV of Lasix in the emergency department with improvement in diuresis currently down to 2 L breathing at 20 times a minute heart rate down to 80s While in ER: Noted to be tachycardic up to 115 was given a dose of metoprolol. BNP elevated at 816 The following Work up has been ordered so far:  Orders Placed This Encounter  Procedures  . Basic metabolic panel  . Brain natriuretic peptide  . If O2 Sat <94%  administer O2 at 2 liters/minute via nasal cannula  . Cardiac monitoring  . Cardiac monitoring  . Consult to hospitalist  . Pulse oximetry, continuous  . I-stat troponin, ED  . ED EKG  . Insert peripheral IV  . Admit to Inpatient (patient's expected length of stay will be greater than 2 midnights or inpatient only procedure)     Following Medications were ordered in ER: Medications  metoprolol tartrate (LOPRESSOR) injection 5 mg (has no administration in time range)  furosemide (LASIX) injection 40 mg (40 mg Intravenous Given 10/22/17 1742)    Significant initial  Findings: Abnormal Labs Reviewed  BASIC METABOLIC PANEL - Abnormal; Notable for the following components:      Result Value   Glucose, Bld 124 (*)    GFR calc non Af Amer 51 (*)    GFR calc Af Amer 59 (*)    All other components within normal limits  BRAIN NATRIURETIC PEPTIDE - Abnormal; Notable for the following components:   B Natriuretic Peptide 860.4 (*)    All other components within normal limits   Troponin 0 0.02  Na 137 K 4 .6  Cr stable,   Lab Results  Component Value Date   CREATININE 0.99 10/22/2017   CREATININE 1.10 (H) 08/28/2017   CREATININE 1.11 (H) 07/30/2017      WBC   6.6  HG/HCT  Down  from baseline see below    Component Value Date/Time   HGB 10.4 (L) 10/22/2017 2106  HCT 32.7 (L) 10/22/2017 2106    Troponin (Point of Care Test) Recent Labs    10/22/17 1738  TROPIPOC 0.02       BNP (last 3 results) Recent Labs    10/22/17 1734  BNP 860.4*    ProBNP (last 3 results) No results for input(s): PROBNP in the last 8760 hours.    UA  not ordered        CTA chest  -bilateral pleural effusions right greater than left No PE ECG:  Personally reviewed by me showing: HR : 115 Rhythm: A.fib. W RVR, No evidence of ischemic changes QTC 493     ED Triage Vitals  Enc Vitals Group     BP 10/22/17 1635 (!) 128/98     Pulse Rate 10/22/17 1635 (!) 116     Resp 10/22/17  1635 20     Temp 10/22/17 1635 (!) 97.5 F (36.4 C)     Temp Source 10/22/17 1635 Oral     SpO2 10/22/17 1635 98 %     Weight --      Height --      Head Circumference --      Peak Flow --      Pain Score 10/22/17 1640 0     Pain Loc --      Pain Edu? --      Excl. in [place]? --   TMAX(24)@       Latest  Blood pressure (!) 133/106, pulse (!) 115, temperature (!) 97.5 F (36.4 C), temperature source Oral, resp. rate (!) 21, SpO2 96 %.   Hospitalist was called for admission for acute respiratory failure secondary to bilateral pleural effusions   Review of Systems:    Pertinent positives include:  shortness of breath at rest.dyspnea on exertion, Bilateral lower extremity swelling  Constitutional:  No weight loss, night sweats, Fevers, chills, fatigue, weight loss  HEENT:  No headaches, Difficulty swallowing,Tooth/dental problems,Sore throat,  No sneezing, itching, ear ache, nasal congestion, post nasal drip,  Cardio-vascular:  No chest pain, Orthopnea, PND, anasarca, dizziness, palpitations.no   GI:  No heartburn, indigestion, abdominal pain, nausea, vomiting, diarrhea, change in bowel habits, loss of appetite, melena, blood in stool, hematemesis Resp:  no No excess mucus, no productive cough, No non-productive cough, No coughing up of blood.No change in color of mucus.No wheezing. Skin:  no rash or lesions. No jaundice GU:  no dysuria, change in color of urine, no urgency or frequency. No straining to urinate.  No flank pain.  Musculoskeletal:  No joint pain or no joint swelling. No decreased range of motion. No back pain.  Psych:  No change in mood or affect. No depression or anxiety. No memory loss.  Neuro: no localizing neurological complaints, no tingling, no weakness, no double vision, no gait abnormality, no slurred speech, no confusion  All systems reviewed and apart from [place] all are negative  Past Medical History:   Past Medical History:  Diagnosis Date  .  Arthritis    ddd-- occ. pain  . Cholelithiasis   . Eczema   . Goiter   . HLD (hyperlipidemia)   . Hx of cardiovascular stress test    a. ETT-MV 7/14:  Low risk, ant defect c/w soft tissue atten., no ischemia, EF 77%  . Hypothyroid   . Lesion of bladder    hematuria/ frequency  . Paroxysmal atrial fibrillation ([place]) 01/2016   cahds2vasc sore is 3.  consider anticoagulation if afib burden increases  . SDH (  subdural hematoma) (HCC)    in the setting of syncope and fall prior to PPM implant, now resolved  . Sick sinus syndrome ([place])   . Syncope    pauses of >4 seconds      Past Surgical History:  Procedure Laterality Date  . ABDOMINAL HYSTERECTOMY  age 9  . APPENDECTOMY  age 51  . bilateral salpingoophectomy  age 58  . CATARACT EXTRACTION W/ INTRAOCULAR LENS  IMPLANT, BILATERAL  2012  . CYSTOSCOPY WITH BIOPSY  11/21/2010   Procedure: CYSTOSCOPY WITH BIOPSY;  Surgeon: Molli Hazard, MD;  Location: [facility];  Service: Urology;  Laterality: N/A;  CYSTOSCOPY WITH BLADDER BIOPSY  AND INSTILLATION OF INDIGO CARMINE  . CYSTOSCOPY/RETROGRADE/URETEROSCOPY  11/21/2010   Procedure: CYSTOSCOPY/RETROGRADE/URETEROSCOPY;  Surgeon: Molli Hazard, MD;  Location: [facility];  Service: Urology;  Laterality: Left;  . gallstones removed  age 54  . LOOP RECORDER IMPLANT N/A 12/31/2012   Procedure: LOOP RECORDER IMPLANT;  Surgeon: Coralyn Mark, MD;  Location: [facility];  Service: Cardiovascular;  Laterality: N/A;  . PACEMAKER INSERTION  05-20-2013   MDT ADDRL1 pacemaker implanted by Dr Rayann Heman for SSS and symptomatic bradycardia  . PERMANENT PACEMAKER INSERTION Left 05/20/2013   Procedure: PERMANENT PACEMAKER INSERTION;  Surgeon: Coralyn Mark, MD;  Location: [facility];  Service: Cardiovascular;  Laterality: Left;    Social History:  Ambulatory   Independently    reports that she has never smoked. She has never used smokeless tobacco. She reports that  she does not drink alcohol or use drugs.     Family History:   Family History  Problem Relation Age of Onset  . Congestive Heart Failure Father   . Heart disease Father   . Stroke Mother     Allergies: Allergies  Allergen Reactions  . Acetaminophen Other (See Comments)    Syncope  . Meclizine Other (See Comments)    Increased dizziness and confusion     Prior to Admission medications   Medication Sig Start Date End Date Taking? Authorizing Provider  apixaban (ELIQUIS) 5 MG TABS tablet Take 1 tablet (5 mg total) by mouth 2 (two) times daily. 08/28/17  Yes Sherran Needs, NP  betamethasone valerate ointment (VALISONE) 0.1 % Apply 1 application topically daily as needed (eczema).  02/11/13  Yes [provider]  calcium carbonate (TUMS EX) 750 MG chewable tablet Chew 2 tablets by mouth daily.   Yes [provider]  levothyroxine (SYNTHROID, LEVOTHROID) 88 MCG tablet Take 88 mcg by mouth every morning.    Yes [provider]  metoprolol succinate (TOPROL-XL) 25 MG 24 hr tablet Take 2 tablets (50 mg total) by mouth daily. Take with or immediately following a meal. 09/17/17  Yes Allred, Jeneen Rinks, MD  Nutritional Supplements (NUTRITIONAL SHAKE PO) Take 237 mLs by mouth daily.   Yes [provider]  potassium citrate (UROCIT-K) 10 MEQ (1080 MG) SR tablet Take 10 mEq by mouth 2 (two) times daily.   Yes [provider]  tobramycin (TOBREX) 0.3 % ophthalmic solution Place 1 drop into the right eye See admin instructions. Once monthly, administer 1 drop into right eye four times a day on the day prior to your eye treatment procedure at [facility] and for 1 day after the eye procedure. Repeat this monthly x 4 more treatments.   Yes [provider]  PRESCRIPTION MEDICATION 1 yearly injection into the right eye    [provider]  Physical Exam: Blood pressure (!) 133/106, pulse (!) 115, temperature (!) 97.5 F (36.4 C),  temperature source Oral, resp. rate (!) 21, SpO2 96 %. 1. General:  in No Acute distress   Chronically ill  -appearing 2. Psychological: Alert and  Oriented 3. Head/ENT:   Moist Mucous Membranes                          Head Non traumatic, neck supple                           Poor Dentition 4. SKIN: normal Skin turgor,  Skin clean Dry and intact no rash 5. Heart: Irregular rate and rhythm no  Murmur, no Rub or gallop 6. Lungs:   no wheezes or crackles  distant 7. Abdomen: Soft, Non distended   bowel sounds present 8. Lower extremities: no clubbing, cyanosis, trace edema 9. Neurologically Grossly intact, moving all 4 extremities equally   10. MSK: Normal range of motion   LABS:    No results for input(s): WBC, NEUTROABS, HGB, HCT, MCV, PLT in the last 168 hours. Basic Metabolic Panel: Recent Labs  Lab 10/22/17 1734  NA 137  K 4.6  CL 105  CO2 23  GLUCOSE 124*  BUN 16  CREATININE 0.99  CALCIUM 9.5      No results for input(s): AST, ALT, ALKPHOS, BILITOT, PROT, ALBUMIN in the last 168 hours. No results for input(s): LIPASE, AMYLASE in the last 168 hours. No results for input(s): AMMONIA in the last 168 hours.    HbA1C: No results for input(s): HGBA1C in the last 72 hours. CBG: No results for input(s): GLUCAP in the last 168 hours.    Urine analysis:    Component Value Date/Time   COLORURINE YELLOW 02/09/2012 0017   APPEARANCEUR CLEAR 02/09/2012 0017   LABSPEC 1.014 02/09/2012 0017   PHURINE 5.0 02/09/2012 0017   GLUCOSEU NEGATIVE 02/09/2012 0017   HGBUR MODERATE (A) 02/09/2012 0017   BILIRUBINUR NEGATIVE 02/09/2012 0017   KETONESUR NEGATIVE 02/09/2012 0017   PROTEINUR NEGATIVE 02/09/2012 0017   UROBILINOGEN 0.2 02/09/2012 0017   NITRITE NEGATIVE 02/09/2012 0017   LEUKOCYTESUR NEGATIVE 02/09/2012 0017       Cultures: No results found for: SDES, SPECREQUEST, CULT, REPTSTATUS   Radiological Exams on Admission: Ct Angio Chest Pe W Or Wo  Contrast  Result Date: 10/22/2017 CLINICAL DATA:  Shortness of breath. EXAM: CT ANGIOGRAPHY CHEST WITH CONTRAST TECHNIQUE: Multidetector CT imaging of the chest was performed using the standard protocol during bolus administration of intravenous contrast. Multiplanar CT image reconstructions and MIPs were obtained to evaluate the vascular anatomy. CONTRAST:  11m ISOVUE-370 IOPAMIDOL (ISOVUE-370) INJECTION 76% COMPARISON:  CT scan of December 24, 2010. FINDINGS: Cardiovascular: Satisfactory opacification of the pulmonary arteries to the segmental level. No evidence of pulmonary embolism. Mild cardiomegaly is noted. No pericardial effusion. Mediastinum/Nodes: No enlarged mediastinal, hilar, or axillary lymph nodes. Thyroid gland, trachea, and esophagus demonstrate no significant findings. Lungs/Pleura: No pneumothorax is noted. Large bilateral pleural effusions are noted, right greater than left. No significant consolidation is noted. Stable calcified granuloma is noted in right middle lobe. Upper Abdomen: No acute abnormality. Musculoskeletal: No chest wall abnormality. No acute or significant osseous findings. Review of the MIP images confirms the above findings. IMPRESSION: No definite evidence of pulmonary embolus. Large bilateral pleural effusions are noted, right greater than left. Electronically Signed   By: JSabino Dick  Brooke Bonito, M.D.   On: 10/22/2017 16:03    Chart has been reviewed    Assessment/Plan   84 y.o. female with medical history significant of Hypothyroidism , paroxysmal atrial fibrillation history of subdural hematoma sick sinus syndrome status post PPM Admitted for  acute respiratory failure secondary to bilateral pleural effusions Present on Admission: . Acute respiratory failure (HCC) secondary to pleural effusions will rehydrate and continue to follow respiratory status . Hypothyroidism TSH we will continue home medications . Pleural effusion -patient responded in the emergency  department to diuresis we will continue.  Obtain echogram evaluate for CHF cycle cardiac enzymes check albumin. If pleural effusions improved with diuresis we will need repeat imaging to evaluate for any underlying pulmonary etiology.  No fever chills her white blood cell count to suggest infectious process  History of atrial fibrillation-           - CHA2DS2 vas score 3 continue current anticoagulation with  Eliquis,           -  Rate control:  Currently controlled with   Toprolol,    will continue   Anemia - - -Obtain anemia panel - Hemoccult stool 3 - Check TSH - If evidence of iron deficiency anemia or Hemoccult positive stools will need further GI workup     Other plan as per orders.  DVT prophylaxis:  On Eliquis Code Status:  FULL CODE  as per patient   I had personally discussed CODE STATUS with patient      Family Communication:   Family no  at  Bedside    Disposition Plan:         To home once workup is complete and patient is stable                   Would benefit from PT/OT eval prior to DC  Ordered                                     Consults called: none  Admission status:    inpatient     Expect 2 midnight stay secondary to severity of patient's current illness including hemodynamic instability despite optimal treatment (tachycardia tachypnea hypoxia)   Severe radiology abnormalities including bilateral pleural effusions    That are currently affecting medical management.  I expect  patient to be hospitalized for 2 midnights requiring inpatient medical care.  Patient is at high risk for adverse outcome (such as loss of life or disability) if not treated.  Indication for inpatient stay as follows:    Need for    IV medications       Level of care    tele  For   24H       Nancy Meadows 10/22/2017, 10:07 PM   [facility]  Pager [phone]   after 2 AM please page floor coverage PA If 7AM-7PM, please contact the day team taking care of the  patient  [email or web address]  Password [email or web address]

## 2017-10-22 NOTE — ED Provider Notes (Signed)
[facility] DEPT Provider Note   CSN: [id number] Arrival date & time: 10/22/17  1616     History   Chief Complaint Chief Complaint  Patient presents with  . Shortness of Breath    HPI Nancy Meadows is a 84 y.o. female.  Pt presents to the ED today with SOB.  Pt has a hx of a.fib and is on Eliquis.  She feels like she's been sob since that was started a few months ago.  It has gotten worse lately.   Her PCP (Dr. Ashby Dawes) ordered a CT chest which showed bilateral pleural effusions.  She was sent here after the CT scan for admission.  She denies f/c.  CHA2DS2/VAS Stroke Risk Points  Current as of 4 minutes ago     3 >= 2 Points: High Risk  1 - 1.99 Points: Medium Risk  0 Points: Low Risk    This is the only CHA2DS2/VAS Stroke Risk Points available for the past  year.:  Last Change: N/A     Details    This score determines the patient's risk of having a stroke if the  patient has atrial fibrillation.       Points Metrics  0 Has Congestive Heart Failure:  No    Current as of 4 minutes ago  0 Has Vascular Disease:  No    Current as of 4 minutes ago  0 Has Hypertension:  No    Current as of 4 minutes ago  2 Age:  62    Current as of 4 minutes ago  0 Has Diabetes:  No    Current as of 4 minutes ago  0 Had Stroke:  No  Had TIA:  No  Had thromboembolism:  No    Current as of 4 minutes ago  1 Female:  Yes    Current as of 4 minutes ago              Past Medical History:  Diagnosis Date  . Arthritis    ddd-- occ. pain  . Cholelithiasis   . Eczema   . Goiter   . HLD (hyperlipidemia)   . Hx of cardiovascular stress test    a. ETT-MV 7/14:  Low risk, ant defect c/w soft tissue atten., no ischemia, EF 77%  . Hypothyroid   . Lesion of bladder    hematuria/ frequency  . Paroxysmal atrial fibrillation ([place]) 01/2016   cahds2vasc sore is 3.  consider anticoagulation if afib burden increases  . SDH (subdural hematoma) (HCC)    in  the setting of syncope and fall prior to PPM implant, now resolved  . Sick sinus syndrome ([place])   . Syncope    pauses of >4 seconds    Patient Active Problem List   Diagnosis Date Noted  . Premature atrial contraction 09/05/2014  . Sick sinus syndrome ([place]) 03/07/2013  . Palpitations 08/17/2012  . Syncope 02/09/2012  . Acute head trauma 02/09/2012  . Subdural hematoma, acute ([place]) 02/09/2012  . SAH (subarachnoid hemorrhage) ([place]) 02/09/2012  . HYPOTHYROIDISM 03/08/2010  . HYPERLIPIDEMIA 03/08/2010  . CARDIAC MURMUR 03/08/2010  . CHEST PAIN 03/08/2010  . DIZZINESS 03/07/2010    Past Surgical History:  Procedure Laterality Date  . ABDOMINAL HYSTERECTOMY  age 66  . APPENDECTOMY  age 32  . bilateral salpingoophectomy  age 84  . CATARACT EXTRACTION W/ INTRAOCULAR LENS  IMPLANT, BILATERAL  2012  . CYSTOSCOPY WITH BIOPSY  11/21/2010   Procedure: CYSTOSCOPY WITH BIOPSY;  Surgeon: Molli Hazard, MD;  Location: [facility];  Service: Urology;  Laterality: N/A;  CYSTOSCOPY WITH BLADDER BIOPSY  AND INSTILLATION OF INDIGO CARMINE  . CYSTOSCOPY/RETROGRADE/URETEROSCOPY  11/21/2010   Procedure: CYSTOSCOPY/RETROGRADE/URETEROSCOPY;  Surgeon: Molli Hazard, MD;  Location: [facility];  Service: Urology;  Laterality: Left;  . gallstones removed  age 49  . LOOP RECORDER IMPLANT N/A 12/31/2012   Procedure: LOOP RECORDER IMPLANT;  Surgeon: Coralyn Mark, MD;  Location: [place] CATH LAB;  Service: Cardiovascular;  Laterality: N/A;  . PACEMAKER INSERTION  05-20-2013   MDT ADDRL1 pacemaker implanted by Dr Rayann Heman for SSS and symptomatic bradycardia  . PERMANENT PACEMAKER INSERTION Left 05/20/2013   Procedure: PERMANENT PACEMAKER INSERTION;  Surgeon: Coralyn Mark, MD;  Location: [place] CATH LAB;  Service: Cardiovascular;  Laterality: Left;     OB History   None      Home Medications    Prior to Admission medications   Medication Sig Start Date End Date Taking?  Authorizing Provider  apixaban (ELIQUIS) 5 MG TABS tablet Take 1 tablet (5 mg total) by mouth 2 (two) times daily. 08/28/17  Yes Sherran Needs, NP  betamethasone valerate ointment (VALISONE) 0.1 % Apply 1 application topically daily as needed (eczema).  02/11/13  Yes [provider]  calcium carbonate (TUMS EX) 750 MG chewable tablet Chew 2 tablets by mouth daily.   Yes [provider]  levothyroxine (SYNTHROID, LEVOTHROID) 88 MCG tablet Take 88 mcg by mouth every morning.    Yes [provider]  metoprolol succinate (TOPROL-XL) 25 MG 24 hr tablet Take 2 tablets (50 mg total) by mouth daily. Take with or immediately following a meal. 09/17/17  Yes Allred, Jeneen Rinks, MD  Nutritional Supplements (NUTRITIONAL SHAKE PO) Take 237 mLs by mouth daily.   Yes [provider]  potassium citrate (UROCIT-K) 10 MEQ (1080 MG) SR tablet Take 10 mEq by mouth 2 (two) times daily.   Yes [provider]  tobramycin (TOBREX) 0.3 % ophthalmic solution Place 1 drop into the right eye See admin instructions. Once monthly, administer 1 drop into right eye four times a day on the day prior to your eye treatment procedure at [facility] and for 1 day after the eye procedure. Repeat this monthly x 4 more treatments.   Yes [provider]  PRESCRIPTION MEDICATION 1 yearly injection into the right eye    [provider]    Family History Family History  Problem Relation Age of Onset  . Congestive Heart Failure Father   . Heart disease Father   . Stroke Mother     Social History Social History   Tobacco Use  . Smoking status: Never Smoker  . Smokeless tobacco: Never Used  . Tobacco comment: tobacco use - no  Substance Use Topics  . Alcohol use: No  . Drug use: No     Allergies   Acetaminophen and Meclizine   Review of Systems Review of Systems  Respiratory: Positive for shortness of breath.   All other systems reviewed and are  negative.    Physical Exam Updated Vital Signs BP (!) 133/106   Pulse (!) 115   Temp (!) 97.5 F (36.4 C) (Oral)   Resp (!) 21   SpO2 96%   Physical Exam  Constitutional: She is oriented to person, place, and time. She appears well-developed and well-nourished.  HENT:  Head: Normocephalic and atraumatic.  Mouth/Throat: Oropharynx is clear and moist.  Eyes:  Pupils are equal, round, and reactive to light. EOM are normal.  Neck: Normal range of motion. Neck supple.  Cardiovascular: An irregularly irregular rhythm present. Tachycardia present.  Pulmonary/Chest: Tachypnea noted. She has decreased breath sounds.  Abdominal: Soft. Bowel sounds are normal.  Musculoskeletal: Normal range of motion.       Right lower leg: She exhibits edema.       Left lower leg: She exhibits edema.  Neurological: She is alert and oriented to person, place, and time.  Skin: Skin is warm. Capillary refill takes less than 2 seconds.  Psychiatric: She has a normal mood and affect. Her behavior is normal.  Nursing note and vitals reviewed.    ED Treatments / Results  Labs (all labs ordered are listed, but only abnormal results are displayed) Labs Reviewed  BASIC METABOLIC PANEL - Abnormal; Notable for the following components:      Result Value   Glucose, Bld 124 (*)    GFR calc non Af Amer 51 (*)    GFR calc Af Amer 59 (*)    All other components within normal limits  BRAIN NATRIURETIC PEPTIDE - Abnormal; Notable for the following components:   B Natriuretic Peptide 860.4 (*)    All other components within normal limits  I-STAT TROPONIN, ED    EKG EKG Interpretation  Date/Time:  Wednesday October 22 2017 18:21:24 EDT Ventricular Rate:  115 PR Interval:    QRS Duration: 104 QT Interval:  356 QTC Calculation: 493 R Axis:   -38 Text Interpretation:  Atrial fibrillation Left axis deviation Borderline low voltage, extremity leads Borderline prolonged QT interval Confirmed by Isla Pence  [phone]) on 10/22/2017 6:31:24 PM   Radiology Ct Angio Chest Pe W Or Wo Contrast  Result Date: 10/22/2017 CLINICAL DATA:  Shortness of breath. EXAM: CT ANGIOGRAPHY CHEST WITH CONTRAST TECHNIQUE: Multidetector CT imaging of the chest was performed using the standard protocol during bolus administration of intravenous contrast. Multiplanar CT image reconstructions and MIPs were obtained to evaluate the vascular anatomy. CONTRAST:  110m ISOVUE-370 IOPAMIDOL (ISOVUE-370) INJECTION 76% COMPARISON:  CT scan of December 24, 2010. FINDINGS: Cardiovascular: Satisfactory opacification of the pulmonary arteries to the segmental level. No evidence of pulmonary embolism. Mild cardiomegaly is noted. No pericardial effusion. Mediastinum/Nodes: No enlarged mediastinal, hilar, or axillary lymph nodes. Thyroid gland, trachea, and esophagus demonstrate no significant findings. Lungs/Pleura: No pneumothorax is noted. Large bilateral pleural effusions are noted, right greater than left. No significant consolidation is noted. Stable calcified granuloma is noted in right middle lobe. Upper Abdomen: No acute abnormality. Musculoskeletal: No chest wall abnormality. No acute or significant osseous findings. Review of the MIP images confirms the above findings. IMPRESSION: No definite evidence of pulmonary embolus. Large bilateral pleural effusions are noted, right greater than left. Electronically Signed   By: JMarijo Conception M.D.   On: 10/22/2017 16:03    Procedures Procedures (including critical care time)  Medications Ordered in ED Medications  metoprolol tartrate (LOPRESSOR) injection 5 mg (has no administration in time range)  furosemide (LASIX) injection 40 mg (40 mg Intravenous Given 10/22/17 1742)     Initial Impression / Assessment and Plan / ED Course  I have reviewed the triage vital signs and the nursing notes.  Pertinent labs & imaging results that were available during my care of the patient were reviewed  by me and considered in my medical decision making (see chart for details).    Pt has no hx of CHF and is not on  diuretics.  She will be given 40 mg lasix IV.  Pleural effusions likely due to uncontrolled rate.  Per cardiology notes, hr has been getting high.  Pt did increase her toprol as recommended by her cardiologist.   Pt's O2 sat when sitting still is ok, but when she moves, her HR increases and RR goes up to the 40s.  Pt given 5 mg lopressor IV which has decreased her HR to 100.  Pt d/w Dr. Roel Cluck ([facility]) for admission.  Final Clinical Impressions(s) / ED Diagnoses   Final diagnoses:  Pleural effusion, bilateral  Atrial fibrillation with RVR [facility])    ED Discharge Orders    None       Isla Pence, MD 10/22/17 1900

## 2017-10-22 NOTE — ED Triage Notes (Signed)
Pt sent over from main CT. Pt has bilateral pleural effusions. She endorses SOB over the last 2 days. No pain.

## 2017-10-23 ENCOUNTER — Inpatient Hospital Stay (HOSPITAL_COMMUNITY): Payer: Medicare HMO

## 2017-10-23 DIAGNOSIS — I340 Nonrheumatic mitral (valve) insufficiency: Secondary | ICD-10-CM

## 2017-10-23 DIAGNOSIS — I4891 Unspecified atrial fibrillation: Secondary | ICD-10-CM

## 2017-10-23 LAB — CBC
HEMATOCRIT: 30.6 % — AB (ref 36.0–46.0)
Hemoglobin: 9.6 g/dL — ABNORMAL LOW (ref 12.0–15.0)
MCH: 29.7 pg (ref 26.0–34.0)
MCHC: 31.4 g/dL (ref 30.0–36.0)
MCV: 94.7 fL (ref 80.0–100.0)
PLATELETS: 142 10*3/uL — AB (ref 150–400)
RBC: 3.23 MIL/uL — ABNORMAL LOW (ref 3.87–5.11)
RDW: 19.8 % — AB (ref 11.5–15.5)
WBC: 5.9 10*3/uL (ref 4.0–10.5)
nRBC: 1.2 % — ABNORMAL HIGH (ref 0.0–0.2)

## 2017-10-23 LAB — COMPREHENSIVE METABOLIC PANEL
ALT: 35 U/L (ref 0–44)
AST: 31 U/L (ref 15–41)
Albumin: 3.2 g/dL — ABNORMAL LOW (ref 3.5–5.0)
Alkaline Phosphatase: 55 U/L (ref 38–126)
Anion gap: 8 (ref 5–15)
BUN: 17 mg/dL (ref 8–23)
CHLORIDE: 103 mmol/L (ref 98–111)
CO2: 25 mmol/L (ref 22–32)
CREATININE: 1.1 mg/dL — AB (ref 0.44–1.00)
Calcium: 9 mg/dL (ref 8.9–10.3)
GFR calc Af Amer: 52 mL/min — ABNORMAL LOW (ref >60)
GFR, EST NON AFRICAN AMERICAN: 45 mL/min — AB (ref >60)
Glucose, Bld: 99 mg/dL (ref 70–99)
Potassium: 4.3 mmol/L (ref 3.5–5.1)
Sodium: 136 mmol/L (ref 135–145)
Total Bilirubin: 1.5 mg/dL — ABNORMAL HIGH (ref 0.3–1.2)
Total Protein: 6.2 g/dL — ABNORMAL LOW (ref 6.5–8.1)

## 2017-10-23 LAB — RETICULOCYTES
IMMATURE RETIC FRACT: 27.6 % — AB (ref 2.3–15.9)
RBC.: 3.23 MIL/uL — ABNORMAL LOW (ref 3.87–5.11)
Retic Count, Absolute: 77.2 10*3/uL (ref 19.0–186.0)
Retic Ct Pct: 2.4 % (ref 0.4–3.1)

## 2017-10-23 LAB — TSH: TSH: 2.751 u[IU]/mL (ref 0.350–4.500)

## 2017-10-23 LAB — ECHOCARDIOGRAM COMPLETE
HEIGHTINCHES: 68 in
WEIGHTICAEL: 2510.4 [oz_av]

## 2017-10-23 LAB — TROPONIN I
TROPONIN I: 0.03 ng/mL — AB (ref <0.03)
Troponin I: <0.03 ng/mL (ref <0.03)

## 2017-10-23 LAB — IRON AND TIBC
IRON: 49 ug/dL (ref 28–170)
Saturation Ratios: 13 % (ref 10.4–31.8)
TIBC: 368 ug/dL (ref 250–450)
UIBC: 319 ug/dL

## 2017-10-23 LAB — PHOSPHORUS: Phosphorus: 4.3 mg/dL (ref 2.5–4.6)

## 2017-10-23 LAB — MAGNESIUM: Magnesium: 2 mg/dL (ref 1.7–2.4)

## 2017-10-23 LAB — FERRITIN: Ferritin: 47 ng/mL (ref 11–307)

## 2017-10-23 LAB — FOLATE: FOLATE: 18.9 ng/mL (ref >5.9)

## 2017-10-23 LAB — VITAMIN B12: VITAMIN B 12: 484 pg/mL (ref 180–914)

## 2017-10-23 NOTE — Progress Notes (Signed)
  Echocardiogram 2D Echocardiogram has been performed.  Darlina Sicilian M 10/23/2017, 2:55 PM

## 2017-10-23 NOTE — Progress Notes (Signed)
PROGRESS NOTE    Nancy Meadows  MWN:[id number] DOB: September 11, 1932 DOA: 10/22/2017 PCP: Merrilee Seashore, MD  Brief Narrative: 84 y.o. female with medical history significant of Hypothyroidism , paroxysmal atrial fibrillation history of subdural hematoma sick sinus syndrome status post PPM    Presented with 1 wk persistent shortness of breath some leg swelling. Denies any chest pain. She have had some wheezing. No change in weight. She has not her eliquis today she skipped it this week a few last does was yesterday she thinks it makes her feel bad. she presented to her primary care provider who sent her to get a CAT scan done CT done today showed bilateral pleural effusions.  Patient denies any fevers or chills she has been coughing but no chest pain.  She endorses significant dyspnea with exertion and shortness of breath at rest and minimal exertion patient's respirations went up to 40.  Regarding pertinent Chronic problems: History of atrial fibrillation on Eliquis CHA2DS2VASc score of 3 And Toprol Last echogram in 2014 showing mild mitral regurgitation and preserved EF For symptomatic sinus bradycardia with syncope status post pacemaker in 2018 History of bladder lesions followed by urology  Patient received 40 IV of Lasix in the emergency department with improvement in diuresis currently down to 2 L breathing at 20 times a minute heart rate down to 80s While in ER: Noted to be tachycardic up to 115 was given a dose of metoprolol. BNP elevated at 860    Assessment & Plan:   Active Problems:   Hypothyroidism   Acute respiratory failure (HCC)   Pleural effusion   Anemia  1]acute CHF -most likely systolic echo pending .patient admitted overnight with persistent progressive shortness of breath over 1 week with bilateral lower extremity swelling.  She had a CT scan of the chest done by her PCP which showed bilateral large pleural effusions.  Echo ordered and is pending.  Patient  reports that she is feeling better after IV Lasix was given.  However she is still dependent on oxygen at 3 L has not ambulated yet.  She still complains of dyspnea on exertion.  She is negative by only 680 cc since admission.  Her weight was 71.2 on admission I do not have a weight from today.  We will start her on lisinopril 2.5 mg daily.  Electrolytes are normal.  2] hypothyroidism continue Synthroid TSH normal.  3] chronic atrial fibrillation rate controlled continue Eliquis continue Toprol.  Followed by Dr. Warrick Parisian  4] anemia with normal MCV but low iron probably anemia of chronic disease will check FOBT.  If positive will need work-up as an outpatient.  Folate and B12 are within normal limits.  DVT prophylaxis: Eliquis Code Status: Full code Family Communication: None Disposition Plan: TBD Consultants: None  Procedures: None Antimicrobials: None Subjective: Patient is awake alert resting in bed reports her breathing is better   Objective: Vitals:   10/22/17 2015 10/22/17 2100 10/23/17 0450 10/23/17 0800  BP: (!) 118/94 103/73 105/85 121/88  Pulse: 77 81 76 84  Resp: (!) _0 Temp:  98.7 F (37.1 C) 98.5 F (36.9 C) 98.7 F (37.1 C)  TempSrc:   Oral Oral  SpO2: 100% 100% 100% 100%  Weight:  71.2 kg    Height:  _1  (1.727 m)      Intake/Output Summary (Last 24 hours) at 10/23/2017 0923 Last data filed at 10/23/2017 0454 Gross per 24 hour  Intake 320 ml  Output 1000  ml  Net -680 ml   Filed Weights   10/22/17 2100  Weight: 71.2 kg    Examination:  General exam: Appears calm and comfortable  Respiratory system: Creased breath sounds in the bilateral bases auscultation. Respiratory effort normal. Cardiovascular system: S1 & S2 heard, RRR. No JVD, murmurs, rubs, gallops or clicks.  Trace pedal edema. Gastrointestinal system: Abdomen is nondistended, soft and nontender. No organomegaly or masses felt. Normal bowel sounds heard. Central nervous system: Alert  and oriented. No focal neurological deficits. Extremities: Symmetric 5 x 5 power.  Trace bilateral pitting edema Skin: No rashes, lesions or ulcers Psychiatry: Judgement and insight appear normal. Mood & affect appropriate.     Data Reviewed: I have personally reviewed following labs and imaging studies  CBC: Recent Labs  Lab 10/22/17 2106 10/23/17 0311  WBC 6.6 5.9  NEUTROABS 4.5  --   HGB 10.4* 9.6*  HCT 32.7* 30.6*  MCV 92.6 94.7  PLT 173 953*   Basic Metabolic Panel: Recent Labs  Lab 10/22/17 1734 10/23/17 0311  NA 137 136  K 4.6 4.3  CL 105 103  CO2 23 25  GLUCOSE 124* 99  BUN 16 17  CREATININE 0.99 1.10*  CALCIUM 9.5 9.0  MG  --  2.0  PHOS  --  4.3   GFR: Estimated Creatinine Clearance: 39.1 mL/min (A) (by C-G formula based on SCr of 1.1 mg/dL (H)). Liver Function Tests: Recent Labs  Lab 10/22/17 2106 10/23/17 0311  AST 36 31  ALT 41 35  ALKPHOS 58 55  BILITOT 1.7* 1.5*  PROT 6.9 6.2*  ALBUMIN 3.7 3.2*   No results for input(s): LIPASE, AMYLASE in the last 168 hours. No results for input(s): AMMONIA in the last 168 hours. Coagulation Profile: No results for input(s): INR, PROTIME in the last 168 hours. Cardiac Enzymes: Recent Labs  Lab 10/22/17 2106 10/23/17 0311  TROPONINI <0.03 0.03*   BNP (last 3 results) No results for input(s): PROBNP in the last 8760 hours. HbA1C: No results for input(s): HGBA1C in the last 72 hours. CBG: No results for input(s): GLUCAP in the last 168 hours. Lipid Profile: No results for input(s): CHOL, HDL, LDLCALC, TRIG, CHOLHDL, LDLDIRECT in the last 72 hours. Thyroid Function Tests: Recent Labs    10/23/17 0311  TSH 2.751   Anemia Panel: Recent Labs    10/23/17 0311  VITAMINB12 484  FOLATE 18.9  FERRITIN 47  TIBC 368  IRON 49  RETICCTPCT 2.4   Sepsis Labs: No results for input(s): PROCALCITON, LATICACIDVEN in the last 168 hours.  No results found for this or any previous visit (from the past  240 hour(s)).       Radiology Studies: Ct Angio Chest Pe W Or Wo Contrast  Result Date: 10/22/2017 CLINICAL DATA:  Shortness of breath. EXAM: CT ANGIOGRAPHY CHEST WITH CONTRAST TECHNIQUE: Multidetector CT imaging of the chest was performed using the standard protocol during bolus administration of intravenous contrast. Multiplanar CT image reconstructions and MIPs were obtained to evaluate the vascular anatomy. CONTRAST:  192m ISOVUE-370 IOPAMIDOL (ISOVUE-370) INJECTION 76% COMPARISON:  CT scan of December 24, 2010. FINDINGS: Cardiovascular: Satisfactory opacification of the pulmonary arteries to the segmental level. No evidence of pulmonary embolism. Mild cardiomegaly is noted. No pericardial effusion. Mediastinum/Nodes: No enlarged mediastinal, hilar, or axillary lymph nodes. Thyroid gland, trachea, and esophagus demonstrate no significant findings. Lungs/Pleura: No pneumothorax is noted. Large bilateral pleural effusions are noted, right greater than left. No significant consolidation is noted. Stable calcified granuloma  is noted in right middle lobe. Upper Abdomen: No acute abnormality. Musculoskeletal: No chest wall abnormality. No acute or significant osseous findings. Review of the MIP images confirms the above findings. IMPRESSION: No definite evidence of pulmonary embolus. Large bilateral pleural effusions are noted, right greater than left. Electronically Signed   By: Marijo Conception, M.D.   On: 10/22/2017 16:03        Scheduled Meds: . apixaban  5 mg Oral BID  . furosemide  40 mg Intravenous BID  . levothyroxine  88 mcg Oral [id number]  . metoprolol succinate  50 mg Oral Daily  . sodium chloride flush  3 mL Intravenous Q12H   Continuous Infusions: . sodium chloride       LOS: 1 day     Georgette Shell, MD [facility]  If 7PM-7AM, please contact night-coverage [email or web address] Password [email or web address] 10/23/2017, 9:23 AM

## 2017-10-23 NOTE — Consult Note (Signed)
   [facility] CM Inpatient Consult   10/23/2017  Chessa Barrasso Schueller [birth year] [id number]    [facility] Care Management referral received from inpatient [facility] for CHF disease management.  Went to bedside to speak with Mrs. Andreason about [place] Management program services. She is agreeable and [facility] written consent obtained. [facility] folder provided.  Mrs. Facer reports she lives alone. Confirms Primary Care MD is Dr. Ashby Dawes [facility] listed as doing transition of care calls).  Confirmed best contact number is [phone].  Listed [employer] (son) as emergency contact on [facility] consent [phone].  Discussed [facility] Care Management follow up for CHF disease management. Explained [facility] Care Management will not interfere or replace services provided by home health.  Mrs. Jachim currently hospitalized, per chart, with CHF, pleural effusions. Has medical history of chronic atrial fibrillation, hypothyroidism, SDH, sick sinus syndrome, s/p PPM.  Made inpatient RNCM aware [facility] will follow post discharge.  Will make referral to [facility] for CHF and complex case management.   Marthenia Rolling, MSN-Ed, RN,BSN [facility] Liaison [phone]

## 2017-10-23 NOTE — Evaluation (Signed)
Physical Therapy One Time Evaluation Patient Details Name: Nancy Meadows MRN: [id number] DOB: Jan 14, 1933 Today's Date: 10/23/2017   History of Present Illness  84 y.o. female with medical history significant of Hypothyroidism , paroxysmal atrial fibrillation history of subdural hematoma sick sinus syndrome status post PPM. Admitted/Dx Acute Respitory Failure and acute CHF. She lives alone.  Clinical Impression  Patient evaluated by Physical Therapy with no further acute PT needs identified. All education has been completed and the patient has no further questions. Pt used bathroom independently and ambulated around unit without assistive device.  SpO2 remained 95-98% on room air during session so left O2 [place] off (RN aware).  See below for any follow-up Physical Therapy or equipment needs.  Pt reports she was supposed to start an afib exercise class (pt reports by Dr. Warrick Parisian) tomorrow so she plans to call and cancel however encouraged this class for the future once MD states she is ready. PT is signing off. Thank you for this referral.     Follow Up Recommendations No PT follow up    Equipment Recommendations  None recommended by PT    Recommendations for Other Services       Precautions / Restrictions Precautions Precautions: None      Mobility  Bed Mobility                  Transfers Overall transfer level: Modified independent Equipment used: None                Ambulation/Gait Ambulation/Gait assistance: Supervision;Modified independent (Device/Increase time) Gait Distance (Feet): 200 Feet Assistive device: None Gait Pattern/deviations: WFL(Within Functional Limits)     General Gait Details: no unsteadiness or LOB observed, denies SOB, SPO2 95-98% on room air  Stairs            Wheelchair Mobility    Modified Rankin (Stroke Patients Only)       Balance Overall balance assessment: No apparent balance deficits (not formally assessed)(denies  any falls)                                           Pertinent Vitals/Pain Pain Assessment: No/denies pain    Home Living Family/patient expects to be discharged to:: Private residence Living Arrangements: Alone Available Help at Discharge: Friend(s);Available PRN/intermittently Type of Home: House Home Access: Ramped entrance;Stairs to enter Entrance Stairs-Rails: Left Entrance Stairs-Number of Steps: 2 STE  Home Layout: (2 steps from den into [employer]) Home Equipment: Environmental consultant - 2 wheels;Walker - 4 wheels;Wheelchair - manual;Grab bars - toilet Additional Comments: Note: Manual w/c was pt husbands    Prior Function Level of Independence: Independent               Hand Dominance   Dominant Hand: Right    Extremity/Trunk Assessment        Lower Extremity Assessment Lower Extremity Assessment: Overall WFL for tasks assessed       Communication   Communication: No difficulties  Cognition Arousal/Alertness: Awake/alert Behavior During Therapy: WFL for tasks assessed/performed Overall Cognitive Status: Within Functional Limits for tasks assessed                                        General Comments      Exercises     Assessment/Plan  PT Assessment Patent does not need any further PT services  PT Problem List         PT Treatment Interventions      PT Goals (Current goals can be found in the Care Plan section)  Acute Rehab PT Goals PT Goal Formulation: All assessment and education complete, DC therapy    Frequency     Barriers to discharge        Co-evaluation               AM-PAC PT "6 Clicks" Daily Activity  Outcome Measure Difficulty turning over in bed (including adjusting bedclothes, sheets and blankets)?: None Difficulty moving from lying on back to sitting on the side of the bed? : None Difficulty sitting down on and standing up from a chair with arms (e.g., wheelchair, bedside commode, etc,.)?:  None Help needed moving to and from a bed to chair (including a wheelchair)?: None Help needed walking in hospital room?: None Help needed climbing 3-5 steps with a railing? : None 6 Click Score: 24    End of Session   Activity Tolerance: Patient tolerated treatment well Patient left: in chair;with chair alarm set;with call bell/phone within reach   PT Visit Diagnosis: Difficulty in walking, not elsewhere classified (R26.2)    Time: 5625-6389 PT Time Calculation (min) (ACUTE ONLY): 17 min   Charges:   PT Evaluation $PT Eval Low Complexity: [place], PT, DPT Acute Rehabilitation Services Office: [phone] Pager: [phone]  Trena Platt 10/23/2017, 3:24 PM

## 2017-10-23 NOTE — Evaluation (Signed)
Occupational Therapy Evaluation Patient Details Name: Nancy Meadows MRN: [id number] DOB: September 19, 1932 Today's Date: 10/23/2017    History of Present Illness 84 y.o. female with medical history significant of Hypothyroidism , paroxysmal atrial fibrillation history of subdural hematoma sick sinus syndrome status post PPM. Admitted/Dx Acute Respitory Failure. She lives alone.   Clinical Impression   Pt admitted as above. She was assessed for OT followed by participation in ADL retraining session to include full ADL (grooming, UB/LB bathing and dressing and toileting) at overall Mod I level. Pt states that she has friends and a son whom can provide intermittent assist PRN when she is cleared to d/c home. Her O2 SATs  were 98% on 1L via nasal cannula prior to therapy session and were 96% following ADL's on room air. She denies any SOB, denies pain and denies any further needs from acute OT at this time. Will sign off.    Follow Up Recommendations  No OT follow up;Supervision - Intermittent    Equipment Recommendations  None recommended by OT    Recommendations for Other Services       Precautions / Restrictions Restrictions Weight Bearing Restrictions: No      Mobility Bed Mobility Overal bed mobility: Modified Independent                Transfers Overall transfer level: Modified independent Equipment used: None                  Balance Overall balance assessment: Modified Independent;No apparent balance deficits (not formally assessed)                                         ADL either performed or assessed with clinical judgement   ADL Overall ADL's : Needs assistance/impaired Eating/Feeding: Bed level;Independent   Grooming: Wash/dry hands;Wash/dry face;Oral care;Applying deodorant;Brushing hair;Standing;Modified independent   Upper Body Bathing: Modified independent;Sitting;Standing Upper Body Bathing Details (indicate cue type and  reason): Pt completed UB bathing in sitting and standingat sink Lower Body Bathing: Sit to/from stand;Modified independent   Upper Body Dressing : Independent;Sitting   Lower Body Dressing: Modified independent;Sit to/from stand Lower Body Dressing Details (indicate cue type and reason): Pt don/doffed socks and underwear after completing ADL's standing at sink. Toilet Transfer: Modified Independent;Ambulation;Grab bars   Toileting- Clothing Manipulation and Hygiene: Modified independent;Sit to/from stand;Sitting/lateral lean       Functional mobility during ADLs: Modified independent General ADL Comments: Pt was assessed for OT followed by participation in ADL retraining session to include full ADL (grooming, UB/LB bathing and dressing and toileting) at overall Mod I level. Pt states that she has friends and a son whom can provide intermittent assist PRN when she is cleared to d/c home. Her O2 SATs  were 98% on 1L via nasal cannula prior to therapy session and were 96% following ADL's on room air. She denies any SOB, denies pain and denies any further needs from acute OT at this time. Will sign off.     Vision Baseline Vision/History: Wears glasses(R with detached retina) Wears Glasses: Distance only Patient Visual Report: No change from baseline       Perception     Praxis      Pertinent Vitals/Pain Pain Assessment: No/denies pain Pain Score: 0-No pain     Hand Dominance Right   Extremity/Trunk Assessment Upper Extremity Assessment Upper Extremity Assessment: Overall WFL for tasks  assessed(AROM WFL's bilaterally)   Lower Extremity Assessment Lower Extremity Assessment: Defer to PT evaluation       Communication Communication Communication: No difficulties   Cognition Arousal/Alertness: Awake/alert Behavior During Therapy: WFL for tasks assessed/performed Overall Cognitive Status: Within Functional Limits for tasks assessed                                      General Comments       Exercises     Shoulder Instructions      Home Living Family/patient expects to be discharged to:: Private residence Living Arrangements: Alone Available Help at Discharge: Friend(s);Available PRN/intermittently Type of Home: House Home Access: Ramped entrance;Stairs to enter Entrance Stairs-Number of Steps: 2 STE  Entrance Stairs-Rails: Left Home Layout: (2 steps from den into [employer])     Bathroom Shower/Tub: Tub/shower unit;Curtain   Bathroom Toilet: Handicapped height     Home Equipment: Environmental consultant - 2 wheels;Walker - 4 wheels;Wheelchair - manual;Grab bars - toilet   Additional Comments: Note: Manual w/c was pt husbands      Prior Functioning/Environment Level of Independence: Independent                 OT Problem List:        OT Treatment/Interventions:      OT Goals(Current goals can be found in the care plan section) Acute Rehab OT Goals Patient Stated Goal: Go home today OT Goal Formulation: All assessment and education complete, DC therapy  OT Frequency:     Barriers to D/C:            Co-evaluation              AM-PAC PT "6 Clicks" Daily Activity     Outcome Measure Help from another person eating meals?: None Help from another person taking care of personal grooming?: None Help from another person toileting, which includes using toliet, bedpan, or urinal?: A Little Help from another person bathing (including washing, rinsing, drying)?: None Help from another person to put on and taking off regular upper body clothing?: None Help from another person to put on and taking off regular lower body clothing?: None 6 Click Score: 23   End of Session Equipment Utilized During Treatment: Other (comment)(Pt started with 1L O2 via Nasal Cannula, completed ADL w/o O2 and was 98-96% throughout session. 1L O2 reapplied at conclusion of session.) Nurse Communication: Mobility status;Other (comment)(ADL's completed;  overall Mod I level)  Activity Tolerance: Patient tolerated treatment well;Other (comment)(Denies any pain, denies need for further acute OT) Patient left: in chair;with call bell/phone within reach;with chair alarm set                   Time: [id number] OT Time Calculation (min): 44 min Charges:  OT General Charges $OT Visit: 1 Visit OT Evaluation $OT Eval Low Complexity: 1 Low OT Treatments $Self Care/Home Management : 23-37 mins   Gertha Lichtenberg Beth Dixon, OTR/L 10/23/2017, 10:41 AM

## 2017-10-23 NOTE — Progress Notes (Signed)
CRITICAL VALUE ALERT  Critical Value: Troponin 0.03  Date & Time Notied: 10/23/17;0450  Provider Notified:yes  Orders Received/Actions taken: awaiting any new orders.

## 2017-10-24 ENCOUNTER — Inpatient Hospital Stay (HOSPITAL_COMMUNITY): Payer: Medicare HMO

## 2017-10-24 DIAGNOSIS — R0902 Hypoxemia: Secondary | ICD-10-CM

## 2017-10-24 LAB — BASIC METABOLIC PANEL
Anion gap: 11 (ref 5–15)
BUN: 20 mg/dL (ref 8–23)
CALCIUM: 9.1 mg/dL (ref 8.9–10.3)
CO2: 28 mmol/L (ref 22–32)
CREATININE: 1.11 mg/dL — AB (ref 0.44–1.00)
Chloride: 102 mmol/L (ref 98–111)
GFR calc non Af Amer: 45 mL/min — ABNORMAL LOW (ref >60)
GFR, EST AFRICAN AMERICAN: 52 mL/min — AB (ref >60)
Glucose, Bld: 90 mg/dL (ref 70–99)
Potassium: 3.8 mmol/L (ref 3.5–5.1)
Sodium: 141 mmol/L (ref 135–145)

## 2017-10-24 MED ORDER — METOPROLOL SUCCINATE ER 25 MG PO TB24: 25.0000 mg | ORAL_TABLET | Freq: Every day | ORAL | 0 refills | Status: DC

## 2017-10-24 MED ORDER — POTASSIUM CHLORIDE ER 10 MEQ PO TBCR: EXTENDED_RELEASE_TABLET | ORAL | 0 refills | Status: DC

## 2017-10-24 MED ORDER — FUROSEMIDE 40 MG PO TABS: ORAL_TABLET | ORAL | 1 refills | Status: DC

## 2017-10-24 NOTE — Care Management Note (Signed)
Case Management Note  Patient Details  Name: Nancy Meadows MRN: [id number] Date of Birth: 1932/12/26  Subjective/Objective:                    Action/Plan: Home with [facility]  Expected Discharge Date:  10/24/17               Expected Discharge Plan:  [place]  In-House Referral:     Discharge planning Services  CM Consult  Post Acute Care Choice:    Choice offered to:     DME Arranged:    DME Agency:     HH Arranged:  RN [place] Agency:  [place]  Status of Service:  Completed, signed off  If discussed at Hall of Stay Meetings, dates discussed:    Additional CommentsPurcell Mouton, RN 10/24/2017, 12:21 PM

## 2017-10-24 NOTE — Progress Notes (Signed)
PT Cancellation Note  Patient Details Name: Nancy Meadows MRN: [id number] DOB: [birth year]   Cancelled Treatment:    Reason Eval/Treat Not Completed: Pt evaulated 10/10-please see note. Thanks.    Weston Anna, PT Acute Rehabilitation Services Pager: [phone] Office: [phone]

## 2017-10-24 NOTE — Discharge Summary (Addendum)
Physician Discharge Summary  Nancy Meadows TFT:[id number] DOB: March 25, 1932 DOA: 10/22/2017  PCP: Merrilee Seashore, MD  Admit date: 10/22/2017 Discharge date: 10/24/2017  Admitted From: Home Disposition: Home  Recommendations for Outpatient Follow-up:  1. Follow up with PCP in 1-2 weeks 2. Please obtain BMP/CBC in one week 3. Follow-up with cardiology Dr. Chrisandra Carota in 1 to 2 weeks  Home Health none Equipment/Devices none  Discharge Condition: Stable CODE STATUS: Full code Diet recommendation cardiac Brief/Interim Summary:84 y.o.femalewith medical history significant of Hypothyroidism ,paroxysmal atrial fibrillation history of subdural hematoma sick sinus syndrome status post PPM   Presented with1 wkpersistent shortness of breathsome leg swelling. Denies any chest pain. She have had some wheezing. No change in weight. She has not her eliquis today she skipped it this week a few last does was yesterday she thinks it makes her feel bad.she presented to her primary care provider who sent her to get a CAT scan done CT done today showed bilateral pleural effusions. Patient denies any fevers or chills she has been coughing but no chest pain. She endorses significant dyspnea with exertion and shortness of breath at rest and minimal exertion patient's respirations went up to 40.  Regarding pertinent Chronic problems:History of atrial fibrillation on EliquisCHA2DS2VASc score of 3 And Toprol Last echogram in 2014 showing mild mitral regurgitation and preserved EF For symptomatic sinus bradycardia with syncope status post pacemakerin 2018 History of bladder lesions followed by urology  Patient received 40 IV of Lasix in the emergency department with improvement in diuresis currently down to 2 L breathing at 20 times a minute heart rate down to 80s While in ER: Noted to be tachycardic up to 115 was given a dose of metoprolol. BNP elevated at 860   Discharge Diagnoses:  Active  Problems:   Hypothyroidism   Acute respiratory failure (HCC)   Pleural effusion, bilateral   Anemia   Atrial fibrillation with RVR (HCC)  1]B/L pleural effusion echo ef 50 to 55 .patient admitted overnight with persistent progressive shortness of breath over 1 week with bilateral lower extremity swelling.  She had a CT scan of the chest done by her PCP which showed bilateral large pleural effusions.  Echo -ejection fraction 50 to 55%, wall motion normal, no regional wall motion abnormalities.  Moderate mitral valve regurgitation.  Patient received IV Lasix symptoms improved she was walking without oxygen without shortness of breath or without any discomfort prior to discharge.  Patient is anxious and feels ready to go home.  She has no lower extremity edema at this time.  She knows that she should follow-up with her cardiologist upon discharge.  I will be discharging her on Lasix and potassium beta-blocker.  The dose of the beta-blocker was decreased to 25 mg from 50 mg due to soft blood pressure.  2] hypothyroidism continue Synthroid TSH normal.  3] chronic atrial fibrillation rate controlled continue Eliquis continue Toprol.  Followed by Dr. Warrick Parisian  4] anemia with normal MCV but low iron probably anemia of chronic disease.  Stable.  Discharge Instructions  Discharge Instructions    AMB Referral to [place] Management   Complete by:  As directed    Please assign to [place] for CHF and complex case management. Written consent obtained PCP office [facility]) listed as doing toc. Currently at [facility]. Please call with questions. Marthenia Rolling, MSN-Ed, [facility] [phone]   Reason for consult:  Please assign to [facility] RNCM   Diagnoses of:  Heart Failure   Expected date of contact:  1-3 days (reserved for hospital discharges)   Call MD for:  difficulty breathing, headache or visual disturbances   Complete by:  As directed     Call MD for:  difficulty breathing, headache or visual disturbances   Complete by:  As directed    Call MD for:  persistant dizziness or light-headedness   Complete by:  As directed    Call MD for:  persistant dizziness or light-headedness   Complete by:  As directed    Call MD for:  persistant nausea and vomiting   Complete by:  As directed    Call MD for:  persistant nausea and vomiting   Complete by:  As directed    Call MD for:  severe uncontrolled pain   Complete by:  As directed    Diet - low sodium heart healthy   Complete by:  As directed    Diet - low sodium heart healthy   Complete by:  As directed    Increase activity slowly   Complete by:  As directed    Increase activity slowly   Complete by:  As directed      Allergies as of 10/24/2017      Reactions   Acetaminophen Other (See Comments)   Syncope   Meclizine Other (See Comments)   Increased dizziness and confusion      Medication List    STOP taking these medications   potassium citrate 10 MEQ (1080 MG) SR tablet Commonly known as:  UROCIT-K     TAKE these medications   apixaban 5 MG Tabs tablet Commonly known as:  ELIQUIS Take 1 tablet (5 mg total) by mouth 2 (two) times daily.   betamethasone valerate ointment 0.1 % Commonly known as:  VALISONE Apply 1 application topically daily as needed (eczema).   calcium carbonate 750 MG chewable tablet Commonly known as:  TUMS EX Chew 2 tablets by mouth daily.   furosemide 40 MG tablet Commonly known as:  LASIX Take Lasix 40 mg twice a day for 1 week and then 40 mg daily.   levothyroxine 88 MCG tablet Commonly known as:  SYNTHROID, LEVOTHROID Take 88 mcg by mouth every morning.   metoprolol succinate 25 MG 24 hr tablet Commonly known as:  TOPROL-XL Take 2 tablets (50 mg total) by mouth daily. Take with or immediately following a meal.   NUTRITIONAL SHAKE PO Take 237 mLs by mouth daily.   potassium chloride 10 MEQ tablet Commonly known as:   K-DUR Take potassium 20 mEq twice a day for 1 week and then 20 mEq daily   PRESCRIPTION MEDICATION 1 yearly injection into the right eye   tobramycin 0.3 % ophthalmic solution Commonly known as:  TOBREX Place 1 drop into the right eye See admin instructions. Once monthly, administer 1 drop into right eye four times a day on the day prior to your eye treatment procedure at [facility] and for 1 day after the eye procedure. Repeat this monthly x 4 more treatments.      Follow-up Information    Merrilee Seashore, MD Follow up.   Specialty:  Internal Medicine Contact information: [address] [place] [address] [phone]        Thompson Grayer, MD Follow up.   Specialty:  Cardiology Contact information: [address] [place] [address] [phone]          Allergies  Allergen Reactions  .  Acetaminophen Other (See Comments)    Syncope  . Meclizine Other (See Comments)    Increased dizziness and confusion    Consultations:  None Procedures/Studies: Dg Chest 1 View  Result Date: 10/24/2017 CLINICAL DATA:  Pt came into the ED 2 days ago for SOB and leg swelling. Pt stated she feels better and having no SOB issues. Medical hx of paroxysmal atrial fibrillation. EXAM: CHEST  1 VIEW COMPARISON:  10/23/2017, 10/22/2017 FINDINGS: The patient has a LEFT-sided transvenous pacemaker with leads to the RIGHT atrium and RIGHT ventricle. Heart size is mildly enlarged. There has been some improvement in aeration of the lung bases and bilateral pleural effusions. No new consolidations. IMPRESSION: Improving aeration.  Cardiomegaly. Electronically Signed   By: Nolon Nations M.D.   On: 10/24/2017 10:33   Dg Chest 1 View  Result Date: 10/23/2017 CLINICAL DATA:  Hypoxia, shortness of breath, and leg swelling for 1 week, some wheezing, history atrial fibrillation EXAM: CHEST  1 VIEW COMPARISON:  Portable exam 1012 hours compared to  10/07/2017 and correlated with CT chest of 10/22/2017 FINDINGS: LEFT subclavian transvenous pacemaker leads project over RIGHT atrium and RIGHT ventricle. Enlargement of cardiac silhouette. Mediastinal contours and pulmonary vascularity normal. Bibasilar atelectasis. No definite acute infiltrate, pleural effusion or pneumothorax. Bones demineralized. IMPRESSION: Enlargement of cardiac silhouette. Bibasilar atelectasis. Electronically Signed   By: Lavonia Dana M.D.   On: 10/23/2017 12:35   Dg Chest 2 View  Result Date: 10/08/2017 CLINICAL DATA:  Shortness of breath and wheezing for 1 month EXAM: CHEST - 2 VIEW COMPARISON:  February 22, 2016 FINDINGS: The heart size and mediastinal contours are stable. The heart size is enlarged. Dual lead cardiac pacemaker is unchanged. There probable minimal bilateral pleural effusions. There is no pulmonary edema or focal pneumonia. The visualized skeletal structures are stable. IMPRESSION: No focal pneumonia or pulmonary edema. Minimal bilateral pleural effusions. Electronically Signed   By: Abelardo Diesel M.D.   On: 10/08/2017 09:32   Ct Angio Chest Pe W Or Wo Contrast  Result Date: 10/22/2017 CLINICAL DATA:  Shortness of breath. EXAM: CT ANGIOGRAPHY CHEST WITH CONTRAST TECHNIQUE: Multidetector CT imaging of the chest was performed using the standard protocol during bolus administration of intravenous contrast. Multiplanar CT image reconstructions and MIPs were obtained to evaluate the vascular anatomy. CONTRAST:  18m ISOVUE-370 IOPAMIDOL (ISOVUE-370) INJECTION 76% COMPARISON:  CT scan of December 24, 2010. FINDINGS: Cardiovascular: Satisfactory opacification of the pulmonary arteries to the segmental level. No evidence of pulmonary embolism. Mild cardiomegaly is noted. No pericardial effusion. Mediastinum/Nodes: No enlarged mediastinal, hilar, or axillary lymph nodes. Thyroid gland, trachea, and esophagus demonstrate no significant findings. Lungs/Pleura: No pneumothorax  is noted. Large bilateral pleural effusions are noted, right greater than left. No significant consolidation is noted. Stable calcified granuloma is noted in right middle lobe. Upper Abdomen: No acute abnormality. Musculoskeletal: No chest wall abnormality. No acute or significant osseous findings. Review of the MIP images confirms the above findings. IMPRESSION: No definite evidence of pulmonary embolus. Large bilateral pleural effusions are noted, right greater than left. Electronically Signed   By: JMarijo Conception M.D.   On: 10/22/2017 16:03    (Echo, Carotid, EGD, Colonoscopy, ERCP)    Subjective:   Discharge Exam: Vitals:   10/23/17 2131 10/24/17 0537  BP: 98/72 102/73  Pulse: (!) 59 72  Resp: 20 16  Temp: 98.4 F (36.9 C) 98.2 F (36.8 C)  SpO2: 96% 95%   Vitals:   10/23/17 1501 10/23/17 2131  10/24/17 0500 10/24/17 0537  BP:  98/72  102/73  Pulse:  (!) 59  72  Resp:  20  16  Temp:  98.4 F (36.9 C)  98.2 F (36.8 C)  TempSrc:    Oral  SpO2: 99% 96%  95%  Weight:   67.6 kg   Height:        General: Pt is alert, awake, not in acute distress Cardiovascular: Crackles bilateral bases decreased S1/S2 +, no rubs, no gallops Respiratory: CTA bilaterally, no wheezing, no rhonchi Abdominal: Soft, NT, ND, bowel sounds + Extremities: no edema, no cyanosis    The results of significant diagnostics from this hospitalization (including imaging, microbiology, ancillary and laboratory) are listed below for reference.     Microbiology: No results found for this or any previous visit (from the past 240 hour(s)).   Labs: BNP (last 3 results) Recent Labs    10/22/17 1734  BNP 498.2*   Basic Metabolic Panel: Recent Labs  Lab 10/22/17 1734 10/23/17 0311 10/24/17 0433  NA 137 136 141  K 4.6 4.3 3.8  CL 105 103 102  CO2 _0 GLUCOSE 124* 99 90  BUN _1 CREATININE 0.99 1.10* 1.11*  CALCIUM 9.5 9.0 9.1  MG  --  2.0  --   PHOS  --  4.3  --    Liver  Function Tests: Recent Labs  Lab 10/22/17 2106 10/23/17 0311  AST 36 31  ALT 41 35  ALKPHOS 58 55  BILITOT 1.7* 1.5*  PROT 6.9 6.2*  ALBUMIN 3.7 3.2*   No results for input(s): LIPASE, AMYLASE in the last 168 hours. No results for input(s): AMMONIA in the last 168 hours. CBC: Recent Labs  Lab 10/22/17 2106 10/23/17 0311  WBC 6.6 5.9  NEUTROABS 4.5  --   HGB 10.4* 9.6*  HCT 32.7* 30.6*  MCV 92.6 94.7  PLT 173 142*   Cardiac Enzymes: Recent Labs  Lab 10/22/17 2106 10/23/17 0311 10/23/17 0845  TROPONINI <0.03 0.03* <0.03   BNP: Invalid input(s): POCBNP CBG: No results for input(s): GLUCAP in the last 168 hours. D-Dimer No results for input(s): DDIMER in the last 72 hours. Hgb A1c No results for input(s): HGBA1C in the last 72 hours. Lipid Profile No results for input(s): CHOL, HDL, LDLCALC, TRIG, CHOLHDL, LDLDIRECT in the last 72 hours. Thyroid function studies Recent Labs    10/23/17 0311  TSH 2.751   Anemia work up Recent Labs    10/23/17 0311  VITAMINB12 484  FOLATE 18.9  FERRITIN 47  TIBC 368  IRON 49  RETICCTPCT 2.4   Urinalysis    Component Value Date/Time   COLORURINE YELLOW 02/09/2012 0017   APPEARANCEUR CLEAR 02/09/2012 0017   LABSPEC 1.014 02/09/2012 0017   PHURINE 5.0 02/09/2012 0017   GLUCOSEU NEGATIVE 02/09/2012 0017   HGBUR MODERATE (A) 02/09/2012 0017   BILIRUBINUR NEGATIVE 02/09/2012 0017   KETONESUR NEGATIVE 02/09/2012 0017   PROTEINUR NEGATIVE 02/09/2012 0017   UROBILINOGEN 0.2 02/09/2012 0017   NITRITE NEGATIVE 02/09/2012 0017   LEUKOCYTESUR NEGATIVE 02/09/2012 0017   Sepsis Labs Invalid input(s): PROCALCITONIN,  WBC,  LACTICIDVEN Microbiology No results found for this or any previous visit (from the past 240 hour(s)).   Time coordinating discharge:  34 minutes  SIGNED:   Georgette Shell, MD  [facility] 10/24/2017, 11:32 AM Pager   If 7PM-7AM, please contact night-coverage [email or web address] Password  [email or web address]

## 2017-10-24 NOTE — Discharge Instructions (Signed)
Follow-up with PCP and cardiologist in 1 to 2 weeks.  Please make sure you get a BMP done within 1 week to follow-up on her renal functions and potassium.

## 2017-10-27 ENCOUNTER — Other Ambulatory Visit: Payer: Self-pay | Admitting: *Deleted

## 2017-10-27 NOTE — Patient Outreach (Signed)
[facility]) Care Management  10/27/2017  Nancy Meadows January 16, 1932 [id number]   Referral received from hospital liaison as member was recently admitted to hospital for a-fib and pleural effusion.  Primary MD office will complete transition of care assessment.  Per chart, she also has history of sick sinus syndrome, hypothyroidism, and hyperlipidemia. Call placed to member, identity verified.  This care manager introduced self and purpose of call.    Report she was provided with Eliquis to treat her a-fib but though it was causing shortness of breath.  She wanted to discontinue it but continued to follow MD orders, report she is ok now.  It was determined that member's shortness of breath was caused by pleural effusion.  She is now receiving Lasix, report some relief.  Cardiologist is Dr. Rayann Heman, no follow up appointment scheduled yet, advised to schedule.  Transportation is not an issue.  She still drive short distances and report son lives within 5 minutes of her home.   She denies any urgent concerns, agrees to home visit next week. Will perform initial assessment at that time and develop individualized care plan.  Advised to contact this care manager with questions.  Valente David, [place], MSN [place] [phone]

## 2017-10-28 DIAGNOSIS — E785 Hyperlipidemia, unspecified: Secondary | ICD-10-CM | POA: Diagnosis not present

## 2017-10-28 DIAGNOSIS — Z950 Presence of cardiac pacemaker: Secondary | ICD-10-CM | POA: Diagnosis not present

## 2017-10-28 DIAGNOSIS — I495 Sick sinus syndrome: Secondary | ICD-10-CM | POA: Diagnosis not present

## 2017-10-28 DIAGNOSIS — E039 Hypothyroidism, unspecified: Secondary | ICD-10-CM | POA: Diagnosis not present

## 2017-10-28 DIAGNOSIS — D649 Anemia, unspecified: Secondary | ICD-10-CM | POA: Diagnosis not present

## 2017-10-28 DIAGNOSIS — I509 Heart failure, unspecified: Secondary | ICD-10-CM | POA: Diagnosis not present

## 2017-10-28 DIAGNOSIS — Z7901 Long term (current) use of anticoagulants: Secondary | ICD-10-CM | POA: Diagnosis not present

## 2017-10-28 DIAGNOSIS — I340 Nonrheumatic mitral (valve) insufficiency: Secondary | ICD-10-CM | POA: Diagnosis not present

## 2017-10-28 DIAGNOSIS — M810 Age-related osteoporosis without current pathological fracture: Secondary | ICD-10-CM | POA: Diagnosis not present

## 2017-10-28 DIAGNOSIS — I482 Chronic atrial fibrillation, unspecified: Secondary | ICD-10-CM | POA: Diagnosis not present

## 2017-11-03 ENCOUNTER — Encounter: Payer: Self-pay | Admitting: *Deleted

## 2017-11-03 ENCOUNTER — Other Ambulatory Visit: Payer: Self-pay | Admitting: *Deleted

## 2017-11-03 NOTE — Patient Outreach (Signed)
[facility]) Care Management   11/03/2017  Mariellen Blaney Manso [birth year] [id number]  Saraya Tirey Astacio is an 84 y.o. female  Subjective:   Member alert and oriented x3, denies any pain or discomfort at this time.  Report compliance with medications, follow up with cardiology next week.  Objective:   Review of Systems  Constitutional: Negative.   HENT: Negative.   Eyes: Negative.   Respiratory: Positive for shortness of breath.        Intermittent  Cardiovascular: Negative.   Gastrointestinal: Negative.   Genitourinary: Negative.   Musculoskeletal: Negative.   Skin: Negative.   Neurological: Negative.   Endo/Heme/Allergies: Negative.   Psychiatric/Behavioral: Negative.     Physical Exam  Constitutional: She is oriented to person, place, and time. She appears well-developed and well-nourished.  Neck: Normal range of motion.  Cardiovascular: Normal rate.  Irregular  Respiratory: Effort normal and breath sounds normal.  GI: Soft. Bowel sounds are normal.  Musculoskeletal: Normal range of motion.  Neurological: She is alert and oriented to person, place, and time.  Skin: Skin is warm and dry.   BP 124/62 (BP Location: Right Arm, Patient Position: Sitting, Cuff Size: Normal)   Pulse (!) 108   Resp 20   Ht 1.702 m (_0 )   Wt 140 lb (63.5 kg)   SpO2 97%   BMI 21.93 kg/m   Encounter Medications:   Outpatient Encounter Medications as of 11/03/2017  Medication Sig Note  . apixaban (ELIQUIS) 5 MG TABS tablet Take 1 tablet (5 mg total) by mouth 2 (two) times daily. 10/07/2017: Taking once daily for last 2 days due to wheezing and SOB  . betamethasone valerate ointment (VALISONE) 0.1 % Apply 1 application topically daily as needed (eczema).  03/15/2013: .  . calcium carbonate (TUMS EX) 750 MG chewable tablet Chew 2 tablets by mouth daily.   . furosemide (LASIX) 40 MG tablet Take Lasix 40 mg twice a day for 1 week and then 40 mg daily.   Marland Kitchen levothyroxine  (SYNTHROID, LEVOTHROID) 88 MCG tablet Take 88 mcg by mouth every morning.    . metoprolol succinate (TOPROL-XL) 25 MG 24 hr tablet Take 1 tablet (25 mg total) by mouth daily. Take with or immediately following a meal.   . potassium chloride (K-DUR) 10 MEQ tablet Take potassium 20 mEq twice a day for 1 week and then 20 mEq daily   . PRESCRIPTION MEDICATION 1 yearly injection into the right eye 10/22/2017: Next injection due in December  . tobramycin (TOBREX) 0.3 % ophthalmic solution Place 1 drop into the right eye See admin instructions. Once monthly, administer 1 drop into right eye four times a day on the day prior to your eye treatment procedure at [facility] and for 1 day after the eye procedure. Repeat this monthly x 4 more treatments.   . Nutritional Supplements (NUTRITIONAL SHAKE PO) Take 237 mLs by mouth daily.    No facility-administered encounter medications on file as of 11/03/2017.     Functional Status:   In your present state of health, do you have any difficulty performing the following activities: 11/03/2017 10/22/2017  Hearing? N N  Vision? Y Y  Comment - hx bleeding behind retina in right eye  Difficulty concentrating or making decisions? Y Y  Comment related to age "slightly"  Walking or climbing stairs? Y Y  Comment short of breath gets short of breath  Dressing or bathing? N N  Doing errands, shopping? N N  Preparing Food and eating ? N -  Using the Toilet? N -  In the past six months, have you accidently leaked urine? N -  Do you have problems with loss of bowel control? N -  Managing your Medications? Y -  Managing your Finances? N -  Housekeeping or managing your Housekeeping? N -  Some recent data might be hidden    Fall/Depression Screening:    Fall Risk  11/03/2017  Falls in the past year? No   PHQ 2/9 Scores 11/03/2017  PHQ - 2 Score 0    Assessment:    Met with member at scheduled time.  Assessment complete.  She lives alone but has  support of family.  She does weigh herself daily, state she will need to start recording daily to monitor trends.  Will have son purchase blood pressure monitor. Denies concerns, advised to contact this care manager with questions.  Provided with contact information.  Plan:   Will follow up with member within the next month.  THN CM Care Plan Problem One     Most Recent Value  Care Plan Problem One  Risk for hospitalization related to complications of A-fib  Role Documenting the Problem One  Care Management [place] for Problem One  Active  THN Long Term Goal   Member will not verbalize any complications of atrial fibrillation over the next 31 days  THN Long Term Goal Start Date  11/03/17  Interventions for Problem One Long Term Goal  Member educated on action plan for a-fib management.    THN CM Short Term Goal #1   Member will have device to check heart rate within the next 2 weeks  THN CM Short Term Goal #1 Start Date  11/03/17  Interventions for Short Term Goal #1  Educated member on importance of checking heart rate daily in effort to monitor atrial fibrillation/rate control  THN CM Short Term Goal #2   Member will report compliance with medications over the next 4 weeks  THN CM Short Term Goal #2 Start Date  11/03/17  Interventions for Short Term Goal #2  Member educated on importance of adherence to medications in effort to control atrial fibrillation.  Medications reviewed in the home.     Valente David, [place], MSN [place] [phone]

## 2017-11-04 DIAGNOSIS — J90 Pleural effusion, not elsewhere classified: Secondary | ICD-10-CM | POA: Diagnosis not present

## 2017-11-05 ENCOUNTER — Telehealth: Payer: Self-pay | Admitting: Internal Medicine

## 2017-11-05 NOTE — Telephone Encounter (Signed)
New message  Patient c/o Palpitations:  High priority if patient c/o lightheadedness, shortness of breath, or chest pain  1) How long have you had palpitations/irregular HR/ Afib? Are you having the symptoms now? Has history of Afib since 2016 per [facility]. No symptoms at this time  2) Are you currently experiencing lightheadedness, SOB or CP?no   3) Do you have a history of afib (atrial fibrillation) or irregular heart rhythm? Yes   4) Have you checked your BP or HR? (document readings if available): Nancy Meadows states that her hr goes between 15 and 73. BP 110/65 taken at 1:00 pm on 11/05/2017  Are you experiencing any other symptoms? No   Please contact Nancy Meadows with information and contact the patient also.

## 2017-11-05 NOTE — Progress Notes (Signed)
[facility] PREP Weekly Session   Patient Details  Name: Nancy Meadows MRN: [id number] Date of Birth: 30-May-1932 Age: 84 y.o. PCP: Merrilee Seashore, MD  Vitals:   11/04/17 1456  Weight: 140 lb (63.5 kg)    [facility] Weekly seesion - 11/05/17 1400      Weekly Session   Topic Discussed  Health habits;Water      Fun things you did since last meeting:"machine" Things you are grateful for:"exercise" Nutrition celebrations:"low carbs" Barriers:"short of breath"  [name] 11/05/2017, 2:56 PM

## 2017-11-05 NOTE — Telephone Encounter (Signed)
Returned call to [place] with [facility].  Advised Pt has afib.  Advised Pt HR will be fluctuating.  Per Glenard Haring she has to report every time one of her pt's HR is less than 60.  Sanjuan Dame to report to the doctor that is ordering Pt's Home health.  Glenard Haring indicates understanding.

## 2017-11-07 DIAGNOSIS — I4821 Permanent atrial fibrillation: Secondary | ICD-10-CM | POA: Diagnosis not present

## 2017-11-07 DIAGNOSIS — I495 Sick sinus syndrome: Secondary | ICD-10-CM | POA: Diagnosis not present

## 2017-11-07 DIAGNOSIS — E876 Hypokalemia: Secondary | ICD-10-CM | POA: Diagnosis not present

## 2017-11-07 DIAGNOSIS — I5020 Unspecified systolic (congestive) heart failure: Secondary | ICD-10-CM | POA: Diagnosis not present

## 2017-11-12 ENCOUNTER — Encounter: Payer: Self-pay | Admitting: Nurse Practitioner

## 2017-11-12 ENCOUNTER — Ambulatory Visit: Payer: Medicare HMO | Admitting: Nurse Practitioner

## 2017-11-12 VITALS — BP 102/52 | HR 57 | Ht 67.0 in | Wt 146.0 lb

## 2017-11-12 DIAGNOSIS — I4819 Other persistent atrial fibrillation: Secondary | ICD-10-CM | POA: Diagnosis not present

## 2017-11-12 DIAGNOSIS — I495 Sick sinus syndrome: Secondary | ICD-10-CM | POA: Diagnosis not present

## 2017-11-12 LAB — CUP PACEART INCLINIC DEVICE CHECK
Implantable Lead Implant Date: 20150507
Implantable Lead Implant Date: 20150507
Implantable Lead Location: 753859
Implantable Lead Model: 5592
MDC IDC LEAD LOCATION: 753860
MDC IDC PG IMPLANT DT: 20150507
MDC IDC SESS DTM: 20191030091140

## 2017-11-12 NOTE — Progress Notes (Signed)
Electrophysiology Office Note Date: 11/12/2017  ID:  Janaiya Beauchesne Morro, DOB 1932/05/14, MRN [id number]  PCP: Merrilee Seashore, MD Primary Cardiologist: Stanford Breed Electrophysiologist: Allred  CC: Pacemaker follow-up  Mariafernanda P Chokshi is a 84 y.o. female seen today for Dr Rayann Heman.  She presents today for routine electrophysiology followup.  Since last being seen in our clinic, the patient reports doing reasonably well.  She is compliant with Eliquis twice daily. She has also been taking Metoprolol 44m twice daily. She states that her shortness of breath is improving.  She denies chest pain, palpitations, PND, orthopnea, nausea, vomiting, dizziness, syncope, edema, weight gain, or early satiety.  Device History: MDT dual chamber PPM implanted 2015 for SSS   Past Medical History:  Diagnosis Date  . Arthritis    ddd-- occ. pain  . Cholelithiasis   . Eczema   . Goiter   . HLD (hyperlipidemia)   . Hx of cardiovascular stress test    a. ETT-MV 7/14:  Low risk, ant defect c/w soft tissue atten., no ischemia, EF 77%  . Hypothyroid   . Lesion of bladder    hematuria/ frequency  . Parox[place]mal atrial fibrillation ([place] 01/2016   cahds2vasc sore is 3.  consider anticoagulation if afib burden increases  . SDH (subdural hematoma) (HCC)    in the setting of syncope and fall prior to PPM implant, now resolved  . Sick sinus syndrome ([place]   . Syncope    pauses of >4 seconds   Past Surgical History:  Procedure Laterality Date  . ABDOMINAL HYSTERECTOMY  age 84 . APPENDECTOMY  age 89 . bilateral salpingoophectomy  age 84 . CATARACT EXTRACTION W/ INTRAOCULAR LENS  IMPLANT, BILATERAL  2012  . CYSTOSCOPY WITH BIOPSY  11/21/2010   Procedure: CYSTOSCOPY WITH BIOPSY;  Surgeon: DMolli Hazard MD;  [facility]  Service: Urology;  Laterality: N/A;  CYSTOSCOPY WITH BLADDER BIOPSY  AND INSTILLATION OF INDIGO CARMINE  . CYSTOSCOPY/RETROGRADE/URETEROSCOPY  11/21/2010   Procedure: CYSTOSCOPY/RETROGRADE/URETEROSCOPY;  Surgeon: DMolli Hazard MD;  [facility]  Service: Urology;  Laterality: Left;  . gallstones removed  age 84 . LOOP RECORDER IMPLANT N/A 12/31/2012   Procedure: LOOP RECORDER IMPLANT;  Surgeon: JCoralyn Mark MD;  [facility];  Service: Cardiovascular;  Laterality: N/A;  . PACEMAKER INSERTION  05-20-2013   MDT ADDRL1 pacemaker implanted by Dr ARayann Hemanfor SSS and symptomatic bradycardia  . PERMANENT PACEMAKER INSERTION Left 05/20/2013   Procedure: PERMANENT PACEMAKER INSERTION;  Surgeon: JCoralyn Mark MD;  [facility];  Service: Cardiovascular;  Laterality: Left;    Current Outpatient Medications  Medication Sig Dispense Refill  . apixaban (ELIQUIS) 5 MG TABS tablet Take 1 tablet (5 mg total) by mouth 2 (two) times daily. 60 tablet 6  . betamethasone valerate ointment (VALISONE) 0.1 % Apply 1 application topically daily as needed (eczema).     . calcium carbonate (TUMS EX) 750 MG chewable tablet Chew 2 tablets by mouth daily.    . furosemide (LASIX) 40 MG tablet Take Lasix 40 mg twice a day for 1 week and then 40 mg daily. 90 tablet 1  . levothyroxine (SYNTHROID, LEVOTHROID) 88 MCG tablet Take 88 mcg by mouth every morning.     . metoprolol succinate (TOPROL-XL) 25 MG 24 hr tablet Take 1 tablet (25 mg total) by mouth daily. Take with or immediately following a meal. 30 tablet 0  . Nutritional Supplements (NUTRITIONAL  SHAKE PO) Take 237 mLs by mouth daily.    . potassium chloride (K-DUR) 10 MEQ tablet Take potassium 20 mEq twice a day for 1 week and then 20 mEq daily 60 tablet 0  . PRESCRIPTION MEDICATION 1 yearly injection into the right eye    . tobramycin (TOBREX) 0.3 % ophthalmic solution Place 1 drop into the right eye See admin instructions. Once monthly, administer 1 drop into right eye four times a day on the day prior to your eye treatment procedure at [facility] and for 1 day  after the eye procedure. Repeat this monthly x 4 more treatments.     No current facility-administered medications for this visit.     Allergies:   Acetaminophen and Meclizine   Social History: Social History   Socioeconomic History  . Marital status: Married    Spouse name: Not on file  . Number of children: Not on file  . Years of education: Not on file  . Highest education level: Not on file  Occupational History  . Not on file  Social Needs  . Financial resource strain: Not on file  . Food insecurity:    Worry: Not on file    Inability: Not on file  . Transportation needs:    Medical: Not on file    Non-medical: Not on file  Tobacco Use  . Smoking status: Never Smoker  . Smokeless tobacco: Never Used  . Tobacco comment: tobacco use - no  Substance and Sexual Activity  . Alcohol use: No  . Drug use: No  . Sexual activity: Not on file  Lifestyle  . Physical activity:    Days per week: Not on file    Minutes per session: Not on file  . Stress: Not on file  Relationships  . Social connections:    Talks on phone: Not on file    Gets together: Not on file    Attends religious service: Not on file    Active member of club or organization: Not on file    Attends meetings of clubs or organizations: Not on file    Relationship status: Not on file  . Intimate partner violence:    Fear of current or ex partner: Not on file    Emotionally abused: Not on file    Physically abused: Not on file    Forced sexual activity: Not on file  Other Topics Concern  . Not on file  Social History Narrative   Married, pt time Control and instrumentation engineer.     Family History: Family History  Problem Relation Age of Onset  . Congestive Heart Failure Father   . Heart disease Father   . Stroke Mother      Review of Systems: All other systems reviewed and are otherwise negative except as noted above.   Physical Exam: VS:  BP (!) 102/52   Pulse (!) 57   Ht _0  (1.702 m)   Wt 146 lb  (66.2 kg)   SpO2 97%   BMI 22.87 kg/m  , BMI Body mass index is 22.87 kg/m.  GEN- The patient is elderly appearing, alert and oriented x 3 today.   HEENT: normocephalic, atraumatic; sclera clear, conjunctiva pink; hearing intact; oropharynx clear; neck supple  Lungs- Clear to ausculation bilaterally, normal work of breathing.  No wheezes, rales, rhonchi Heart- Irregular rate and rhythm  GI- soft, non-tender, non-distended, bowel sounds present  Extremities- no clubbing, cyanosis, or edema  MS- no significant deformity or atrophy  Skin- warm and dry, no rash or lesion; PPM pocket well healed Psych- euthymic mood, full affect Neuro- strength and sensation are intact  PPM Interrogation- reviewed in detail today,  See PACEART report  EKG:  EKG is not ordered today.  Recent Labs: 10/22/2017: B Natriuretic Peptide 860.4 10/23/2017: ALT 35; Hemoglobin 9.6; Magnesium 2.0; Platelets 142; TSH 2.751 10/24/2017: BUN 20; Creatinine, Ser 1.11; Potassium 3.8; Sodium 141   Wt Readings from Last 3 Encounters:  11/12/17 146 lb (66.2 kg)  11/04/17 140 lb (63.5 kg)  11/03/17 140 lb (63.5 kg)      Assessment and Plan:  1.  Sick sinus syndrome Normal PPM function See Claudia Desanctis Art report Some inappropriate tracking - device reprogrammed to [place] today  2.  Persistent atrial fibrillation Relatively asymptomatic Continue Eliquis for CHADS2VSAC of 3 Heart rates are >100bpm 60% of the time Increase Metoprolol to 37.51m twice daily If heart rates still elevated at follow up, may need to consider AVN ablation but would like to avoid if possible.    Current medicines are reviewed at length with the patient today.   The patient does not have concerns regarding her medicines.  The following changes were made today:  none  Labs/ tests ordered today include: none Orders Placed This Encounter  Procedures  . CUP PACEART INCLINIC DEVICE CHECK     Disposition:   Follow up with [facility], me in 6  weeks  Signed, AChanetta Marshall NP 11/12/2017 9:18 AM  [facility] [address] [address]([phone](office) [phone](fax)

## 2017-11-12 NOTE — Patient Instructions (Signed)
Medication Instructions:  START Metoprolol 37.5 mg twice per day  If you need a refill on your cardiac medications before your next appointment, please call your pharmacy.   Lab work: none If you have labs (blood work) drawn today and your tests are completely normal, you will receive your results only by: Marland Kitchen MyChart Message (if you have MyChart) OR . A paper copy in the mail If you have any lab test that is abnormal or we need to change your treatment, we will call you to review the results.  Testing/Procedures: none  Follow-Up: At [facility], you and your health needs are our priority.  As part of our continuing mission to provide you with exceptional heart care, we have created designated Provider Care Teams.  These Care Teams include your primary Cardiologist (physician) and Advanced Practice Providers (APPs -  Physician Assistants and Nurse Practitioners) who all work together to provide you with the care you need, when you need it. You will need a follow up appointment in 8 weeks.  Please call our office 2 months in advance to schedule this appointment.  You may see Chanetta Marshall or one of the following Engineer, manufacturing Providers on your designated Care Team:   Chanetta Marshall, NP . Tommye Standard, PA-C  Any Other Special Instructions Will Be Listed Below (If Applicable).

## 2017-11-14 NOTE — Progress Notes (Signed)
[facility] PREP Weekly Session   Patient Details  Name: Nancy Meadows MRN: [id number] Date of Birth: 12-23-1932 Age: 84 y.o. PCP: Merrilee Seashore, MD  Vitals:   11/11/17 1106  Weight: 143 lb (64.9 kg)    [facility] Weekly seesion - 11/14/17 1100      Weekly Session   Topic Discussed  Other ways to be active    Minutes exercised this week  45 minutes   30cardio/15strength     Fun things you did since last meeting:"exercise" Things you are grateful for:"machine"   [name] 11/14/2017, 11:06 AM

## 2017-11-14 NOTE — Progress Notes (Signed)
[facility] Report   Patient Details  Name: Nancy Meadows MRN: [id number] Date of Birth: May 15, 1932 Age: 84 y.o. PCP: Merrilee Seashore, MD  Vitals:   11/14/17 1050  Pulse: (!) 120  Resp: 18  Weight: 140 lb (63.5 kg)     [facility] Eval - 11/14/17 1000      Referral    Referring Provider  AFIB clinic    Reason for referral  Inactivity;Orthopedic;Heart Failure;Other   afib   Program Start Date  10/28/17      Measurement   Waist Circumference  33.5 inches    Hip Circumference  40 inches    Body fat  36.7 percent      Information for Trainer   Goals  "to gain strength & improve balance"    Current Exercise  none    Orthopedic Concerns  "low back at times"    Pertinent Medical History  see chart    Current Barriers  none    Medications that affect exercise  Beta blocker      Timed Up and Go (TUGS)   Timed Up and Go  High risk >13 seconds   14.59     Mobility and Daily Activities   I find it easy to walk up or down two or more flights of stairs.  2    I have no trouble taking out the trash.  3    I do housework such as vacuuming and dusting on my own without difficulty.  3    I can easily lift a gallon of milk (8lbs).  4    I can easily walk a mile.  1    I have no trouble reaching into high cupboards or reaching down to pick up something from the floor.  4    I do not have trouble doing out-door work such as Armed forces logistics/support/administrative officer, raking leaves, or gardening.  2      Mobility and Daily Activities   I feel younger than my age.  2    I feel independent.  3    I feel energetic.  3    I live an active life.   2    I feel strong.  3    I feel healthy.  3    I feel active as other people my age.  3      How fit and strong are you.   Fit and Strong Total Score  38      Past Medical History:  Diagnosis Date  . Arthritis    ddd-- occ. pain  . Cholelithiasis   . Eczema   . Goiter   . HLD (hyperlipidemia)   . Hx of cardiovascular stress test    a.  ETT-MV 7/14:  Low risk, ant defect c/w soft tissue atten., no ischemia, EF 77%  . Hypothyroid   . Lesion of bladder    hematuria/ frequency  . Paroxysmal atrial fibrillation ([place]) 01/2016   cahds2vasc sore is 3.  consider anticoagulation if afib burden increases  . SDH (subdural hematoma) (HCC)    in the setting of syncope and fall prior to PPM implant, now resolved  . Sick sinus syndrome ([place])   . Syncope    pauses of >4 seconds   Past Surgical History:  Procedure Laterality Date  . ABDOMINAL HYSTERECTOMY  age 23  . APPENDECTOMY  age 77  . bilateral salpingoophectomy  age 61  . CATARACT EXTRACTION W/ INTRAOCULAR LENS  IMPLANT, BILATERAL  2012  . CYSTOSCOPY WITH BIOPSY  11/21/2010   Procedure: CYSTOSCOPY WITH BIOPSY;  Surgeon: Molli Hazard, MD;  Location: [facility];  Service: Urology;  Laterality: N/A;  CYSTOSCOPY WITH BLADDER BIOPSY  AND INSTILLATION OF INDIGO CARMINE  . CYSTOSCOPY/RETROGRADE/URETEROSCOPY  11/21/2010   Procedure: CYSTOSCOPY/RETROGRADE/URETEROSCOPY;  Surgeon: Molli Hazard, MD;  Location: [facility];  Service: Urology;  Laterality: Left;  . gallstones removed  age 29  . LOOP RECORDER IMPLANT N/A 12/31/2012   Procedure: LOOP RECORDER IMPLANT;  Surgeon: Coralyn Mark, MD;  Location: [facility];  Service: Cardiovascular;  Laterality: N/A;  . PACEMAKER INSERTION  05-20-2013   MDT ADDRL1 pacemaker implanted by Dr Rayann Heman for SSS and symptomatic bradycardia  . PERMANENT PACEMAKER INSERTION Left 05/20/2013   Procedure: PERMANENT PACEMAKER INSERTION;  Surgeon: Coralyn Mark, MD;  Location: [facility] C[facility];  Service: Cardiovascular;  Laterality: Left;   Social History   Tobacco Use  Smoking Status Never Smoker  Smokeless Tobacco Never Used  Tobacco Comment   tobacco use - no     This is a late entry:   Ms. Biomedical scientist started the 12-week PREP at the [facility] on Tues/Thurs from 11:30-12:30.     Nancy Meadows 11/14/2017, 10:54  AM

## 2017-11-18 NOTE — Progress Notes (Signed)
[facility] [employer] PREP Weekly Session   Patient Details  Name: Peta Peachey Cliff MRN: [id number] Date of Birth: 1932-09-23 Age: 84 y.o. PCP: Merrilee Seashore, MD  Vitals:   11/18/17 1939  Weight: 143 lb (64.9 kg)    [facility] Weekly seesion - 11/18/17 1900      Weekly Session   Topic Discussed  Water;Health habits    Minutes exercised this week  --   "cardio-unreported amount/"strength increasing"     Fun things you did since last meeting:"using machines" Things you are grateful for:"doing exercise"  [name] 11/18/2017, 7:40 PM

## 2017-11-25 ENCOUNTER — Telehealth: Payer: Self-pay | Admitting: Nurse Practitioner

## 2017-11-25 ENCOUNTER — Other Ambulatory Visit: Payer: Self-pay

## 2017-11-25 MED ORDER — METOPROLOL SUCCINATE ER 25 MG PO TB24: 37.5000 mg | ORAL_TABLET | Freq: Two times a day (BID) | ORAL | 3 refills | Status: DC

## 2017-11-25 NOTE — Telephone Encounter (Signed)
New Message          Patient medication was changed and [facility] with [place] needs some clarification.

## 2017-11-25 NOTE — Telephone Encounter (Signed)
Spoke with [facility] nurse.  At last OV AS increased Pt Toprol XL 25 mg tablet to 37.5 mg by mouth bid.  This was in OV note and on AVS, however medication was not updated in system and so was incorrect on Pt AVS med list.  Corrected medication and sent to Pt pharmacy.  Verified with nurse that Pt should be taking 37.5 mg of her Toprol XL BID.  Will send to covering nurse as Juluis Rainier.

## 2017-11-28 NOTE — Progress Notes (Signed)
Things you are grateful for:"This class" Nutrition celebrations:" use less salt"

## 2017-12-03 NOTE — Progress Notes (Signed)
[facility] PREP Weekly Session   Patient Details  Name: Nancy Meadows MRN: [id number] Date of Birth: December 22, 1932 Age: 84 y.o. PCP: Merrilee Seashore, MD  Vitals:   12/02/17 1502  Weight: 141 lb 9.6 oz (64.2 kg)    [facility]ows [facility] Weekly seesion - 12/03/17 1500      Weekly Session   Topic Discussed  Stress management and problem solving    Minutes exercised this week  --   cardio/strendth     Things you are grateful for:"The herb list for cooking" Nutrition celebrations:"eating less salt"  [name] 12/03/2017, 3:02 PM

## 2017-12-05 ENCOUNTER — Other Ambulatory Visit: Payer: Self-pay | Admitting: *Deleted

## 2017-12-05 NOTE — Patient Outreach (Signed)
[facility]) Care Management  12/05/2017  Nancy Meadows 02-08-1932 [id number]   Call placed to member to follow up on current health status.  No answer, unable to leave voice message.  Will follow up within the next 4 business days.  Valente David, [place], MSN [place] [phone]

## 2017-12-09 ENCOUNTER — Other Ambulatory Visit: Payer: Self-pay | Admitting: *Deleted

## 2017-12-09 NOTE — Patient Outreach (Signed)
[facility]) Care Management  12/09/2017  Nancy Meadows 1932/07/02 [id number]   2nd attempt made to contact member to follow up on current health status, no answer.  Unable to leave message.  Unsuccessful outreach letter sent, will follow up within the next 4 business days.  Valente David, [place], MSN [place] [phone]

## 2017-12-16 ENCOUNTER — Other Ambulatory Visit: Payer: Self-pay | Admitting: *Deleted

## 2017-12-16 NOTE — Patient Outreach (Signed)
[facility]) Care Management  12/16/2017  Nancy Meadows May 24, 1932 [id number]   Call placed to member to follow up on current health status.  She report she is doing well, still has some shortness of breath at times but state it is much better then previously.  She has labwork on 12/6 then appointment with her primary MD next week.  She is active with [facility] for [employer], next session today.  She has not started daily blood pressure and heart rate monitoring but state she has seen a monitor she is planning to purchase.  Last seen by home health RN last week, discharged from their services for improved health.  Denies any urgent concerns at this time, will follow up within the next 2 weeks.  If remain stable will transition to health coach.  THN CM Care Plan Problem One     Most Recent Value  Care Plan Problem One  Risk for hospitalization related to complications of A-fib  Role Documenting the Problem One  Care Management Coordinator  Care Plan for Problem One  Active  THN Long Term Goal   Member will not verbalize any complications of atrial fibrillation over the next 31 days  THN Long Term Goal Start Date  11/03/17  [facility] Long Term Goal Met Date  12/16/17  [facility] CM Short Term Goal #1   Member will have device to check heart rate within the next 2 weeks  THN CM Short Term Goal #1 Start Date  12/16/17 [Not met, date reset]  Interventions for Short Term Goal #1  Member advised of importance of obtaining monitor to check blood pressure and heart rate daily.  THN CM Short Term Goal #2   Member will report compliance with medications over the next 4 weeks  THN CM Short Term Goal #2 Start Date  11/03/17  [facility] CM Short Term Goal #2 Met Date  12/16/17     Valente David, RN, MSN Nancy Meadows [phone]

## 2017-12-17 NOTE — Progress Notes (Signed)
[facility] PREP Weekly Session   Patient Details  Name: Copelyn Widmer Perritt MRN: [id number] Date of Birth: [birth year] Age: 84 y.o. PCP: Merrilee Seashore, MD  Vitals:   12/16/17 1453  Weight: 140 lb (63.5 kg)    [facility] Weekly seesion - 12/17/17 1400      Weekly Session   Topic Discussed  Importance of resistance training      Things you are grateful for:"family & friends" Nutrition celebrations:"limited sweets" Barriers:"better balance"   [name] 12/17/2017, 2:53 PM

## 2017-12-18 ENCOUNTER — Encounter: Payer: Self-pay | Admitting: Cardiology

## 2017-12-18 DIAGNOSIS — I482 Chronic atrial fibrillation, unspecified: Secondary | ICD-10-CM | POA: Diagnosis not present

## 2017-12-18 DIAGNOSIS — I495 Sick sinus syndrome: Secondary | ICD-10-CM | POA: Diagnosis not present

## 2017-12-18 DIAGNOSIS — I340 Nonrheumatic mitral (valve) insufficiency: Secondary | ICD-10-CM | POA: Diagnosis not present

## 2017-12-18 DIAGNOSIS — I509 Heart failure, unspecified: Secondary | ICD-10-CM | POA: Diagnosis not present

## 2017-12-18 NOTE — Progress Notes (Signed)
Letter  

## 2017-12-19 DIAGNOSIS — Z13220 Encounter for screening for lipoid disorders: Secondary | ICD-10-CM | POA: Diagnosis not present

## 2017-12-19 DIAGNOSIS — I495 Sick sinus syndrome: Secondary | ICD-10-CM | POA: Diagnosis not present

## 2017-12-19 DIAGNOSIS — E876 Hypokalemia: Secondary | ICD-10-CM | POA: Diagnosis not present

## 2017-12-19 DIAGNOSIS — I4821 Permanent atrial fibrillation: Secondary | ICD-10-CM | POA: Diagnosis not present

## 2017-12-23 ENCOUNTER — Ambulatory Visit (INDEPENDENT_AMBULATORY_CARE_PROVIDER_SITE_OTHER): Payer: Medicare HMO

## 2017-12-23 DIAGNOSIS — I495 Sick sinus syndrome: Secondary | ICD-10-CM | POA: Diagnosis not present

## 2017-12-24 ENCOUNTER — Encounter: Payer: Self-pay | Admitting: Nurse Practitioner

## 2017-12-24 NOTE — Progress Notes (Signed)
Remote pacemaker transmission.   

## 2017-12-26 DIAGNOSIS — I4821 Permanent atrial fibrillation: Secondary | ICD-10-CM | POA: Diagnosis not present

## 2017-12-26 DIAGNOSIS — Z23 Encounter for immunization: Secondary | ICD-10-CM | POA: Diagnosis not present

## 2017-12-26 DIAGNOSIS — I5020 Unspecified systolic (congestive) heart failure: Secondary | ICD-10-CM | POA: Diagnosis not present

## 2017-12-26 DIAGNOSIS — E039 Hypothyroidism, unspecified: Secondary | ICD-10-CM | POA: Diagnosis not present

## 2017-12-26 DIAGNOSIS — E876 Hypokalemia: Secondary | ICD-10-CM | POA: Diagnosis not present

## 2017-12-30 ENCOUNTER — Other Ambulatory Visit: Payer: Self-pay | Admitting: *Deleted

## 2017-12-30 NOTE — Patient Outreach (Signed)
[facility]) Care Management  12/30/2017  Nancy Meadows February 27, 1932 [id number]   Call placed to member to follow up on current health condition with plan to transition to health coach.  No answer, unable to leave a voice message.  Will follow up within the next 4 business days.  Valente David, [place], MSN [place] [phone]

## 2018-01-02 ENCOUNTER — Other Ambulatory Visit: Payer: Self-pay | Admitting: *Deleted

## 2018-01-02 DIAGNOSIS — H35362 Drusen (degenerative) of macula, left eye: Secondary | ICD-10-CM | POA: Diagnosis not present

## 2018-01-02 DIAGNOSIS — H43813 Vitreous degeneration, bilateral: Secondary | ICD-10-CM | POA: Diagnosis not present

## 2018-01-02 DIAGNOSIS — H34831 Tributary (branch) retinal vein occlusion, right eye, with macular edema: Secondary | ICD-10-CM | POA: Diagnosis not present

## 2018-01-02 DIAGNOSIS — H3582 Retinal ischemia: Secondary | ICD-10-CM | POA: Diagnosis not present

## 2018-01-02 NOTE — Patient Outreach (Signed)
[facility]) Care Management  01/02/2018  Lillian Ballester Nilan 06/06/1932 [id number]   2nd attempt made to contact member to assess current medical condition and possibly transition to health coach, no answer. Unable to leave voice message. Will send outreach letter, will follow up within the next 4 business days.  Valente David, [place], MSN [place] [phone]

## 2018-01-09 ENCOUNTER — Other Ambulatory Visit: Payer: Self-pay | Admitting: *Deleted

## 2018-01-09 NOTE — Patient Outreach (Addendum)
[facility]) Care Management  01/09/2018  Florie Carico Spratley Jan 25, 1932 [id number]   Call placed to member (3rd attempt) to follow up on current health status and attempt to transition to health coach.  No answer, unable to leave voice message.  Will await call back, if no call back by 12/31 will close case.  Valente David, [place], MSN [place] [phone]

## 2018-01-13 ENCOUNTER — Other Ambulatory Visit: Payer: Self-pay | Admitting: *Deleted

## 2018-01-13 NOTE — Patient Outreach (Addendum)
[facility]) Care Management  01/13/2018  Nancy Meadows [birth year] [id number]   No response from member after 3 unsuccessful attempts and outreach letter.  Will close at this time due to inability to maintain contact.  Will notify primary MD and member of case closure.   Valente David, [place], MSN [place] [phone]

## 2018-01-16 ENCOUNTER — Encounter: Payer: Self-pay | Admitting: Nurse Practitioner

## 2018-01-16 ENCOUNTER — Encounter (INDEPENDENT_AMBULATORY_CARE_PROVIDER_SITE_OTHER): Payer: Self-pay

## 2018-01-16 ENCOUNTER — Ambulatory Visit: Payer: Medicare HMO | Admitting: Nurse Practitioner

## 2018-01-16 VITALS — BP 110/76 | HR 84 | Ht 67.0 in | Wt 145.2 lb

## 2018-01-16 DIAGNOSIS — I495 Sick sinus syndrome: Secondary | ICD-10-CM | POA: Diagnosis not present

## 2018-01-16 DIAGNOSIS — I4819 Other persistent atrial fibrillation: Secondary | ICD-10-CM

## 2018-01-16 MED ORDER — METOPROLOL SUCCINATE ER 50 MG PO TB24: 50.0000 mg | ORAL_TABLET | Freq: Two times a day (BID) | ORAL | 3 refills | Status: DC

## 2018-01-16 NOTE — Progress Notes (Signed)
Electrophysiology Office Note Date: 01/16/2018  ID:  Nancy Meadows, DOB 1932/09/13, MRN [id number]  PCP: Merrilee Seashore, MD Primary Cardiologist: Stanford Breed Electrophysiologist: Allred  CC: Pacemaker follow-up  Nancy Meadows is a 85 y.o. female seen today for Dr Rayann Heman.  She presents today for routine electrophysiology followup.  Since last being seen in our clinic, the patient reports doing relatively well.  She is unaware of tachycardia. She has "good days and bad days". Some shortness of breath with exertion "when she gets going too fast".   She denies chest pain, palpitations, PND, orthopnea, nausea, vomiting, dizziness, syncope, edema, weight gain, or early satiety.  Device History: MDT dual chamber PPM implanted 2015 for SSS   Past Medical History:  Diagnosis Date  . Arthritis    ddd-- occ. pain  . Cholelithiasis   . Eczema   . Goiter   . HLD (hyperlipidemia)   . Hx of cardiovascular stress test    a. ETT-MV 7/14:  Low risk, ant defect c/w soft tissue atten., no ischemia, EF 77%  . Hypothyroid   . Lesion of bladder    hematuria/ frequency  . Paroxysmal atrial fibrillation ([place]) 01/2016   cahds2vasc sore is 3.  consider anticoagulation if afib burden increases  . SDH (subdural hematoma) (HCC)    in the setting of syncope and fall prior to PPM implant, now resolved  . Sick sinus syndrome ([place])   . Syncope    pauses of >4 seconds   Past Surgical History:  Procedure Laterality Date  . ABDOMINAL HYSTERECTOMY  age 22  . APPENDECTOMY  age 37  . bilateral salpingoophectomy  age 63  . CATARACT EXTRACTION W/ INTRAOCULAR LENS  IMPLANT, BILATERAL  2012  . CYSTOSCOPY WITH BIOPSY  11/21/2010   Procedure: CYSTOSCOPY WITH BIOPSY;  Surgeon: Molli Hazard, MD;  Location[facility];  Service: Urology;  Laterality: N/A;  CYSTOSCOPY WITH BLADDER BIOPSY  AND INSTILLATION OF INDIGO CARMINE  . CYSTOSCOPY/RETROGRADE/URETEROSCOPY  11/21/2010   Procedure:  CYSTOSCOPY/RETROGRADE/URETEROSCOPY;  Surgeon: Molli Hazard, MD;  [facility];  Service: Urology;  Laterality: Left;  . gallstones removed  age 62  . LOOP RECORDER IMPLANT N/A 12/31/2012   Procedure: LOOP RECORDER IMPLANT;  Surgeon: Coralyn Mark, MD;  Location: [place] CATH LAB;  Service: Cardiovascular;  Laterality: N/A;  . PACEMAKER INSERTION  05-20-2013   MDT ADDRL1 pacemaker implanted by Dr Rayann Heman for SSS and symptomatic bradycardia  . PERMANENT PACEMAKER INSERTION Left 05/20/2013   Procedure: PERMANENT PACEMAKER INSERTION;  Surgeon: Coralyn Mark, MD;  Location: [place] CATH LAB;  Service: Cardiovascular;  Laterality: Left;    Current Outpatient Medications  Medication Sig Dispense Refill  . apixaban (ELIQUIS) 5 MG TABS tablet Take 1 tablet (5 mg total) by mouth 2 (two) times daily. 60 tablet 6  . betamethasone valerate ointment (VALISONE) 0.1 % Apply 1 application topically daily as needed (eczema).     . calcium carbonate (TUMS EX) 750 MG chewable tablet Chew 2 tablets by mouth daily.    . furosemide (LASIX) 40 MG tablet Take 40 mg by mouth daily.    Marland Kitchen levothyroxine (SYNTHROID, LEVOTHROID) 88 MCG tablet Take 88 mcg by mouth every morning.     . Nutritional Supplements (NUTRITIONAL SHAKE PO) Take 237 mLs by mouth daily.    . potassium chloride (K-DUR,KLOR-CON) 10 MEQ tablet Take 10 mEq by mouth 2 (two) times daily.    Marland Kitchen PRESCRIPTION MEDICATION 1 yearly injection into the  right eye    . tobramycin (TOBREX) 0.3 % ophthalmic solution Place 1 drop into the right eye See admin instructions. Once monthly, administer 1 drop into right eye four times a day on the day prior to your eye treatment procedure at [facility] and for 1 day after the eye procedure. Repeat this monthly x 4 more treatments.     No current facility-administered medications for this visit.     Allergies:   Acetaminophen and Meclizine   Social History: Social History   Socioeconomic  History  . Marital status: Married    Spouse name: Not on file  . Number of children: Not on file  . Years of education: Not on file  . Highest education level: Not on file  Occupational History  . Not on file  Social Needs  . Financial resource strain: Not on file  . Food insecurity:    Worry: Not on file    Inability: Not on file  . Transportation needs:    Medical: Not on file    Non-medical: Not on file  Tobacco Use  . Smoking status: Never Smoker  . Smokeless tobacco: Never Used  . Tobacco comment: tobacco use - no  Substance and Sexual Activity  . Alcohol use: No  . Drug use: No  . Sexual activity: Not on file  Lifestyle  . Physical activity:    Days per week: Not on file    Minutes per session: Not on file  . Stress: Not on file  Relationships  . Social connections:    Talks on phone: Not on file    Gets together: Not on file    Attends religious service: Not on file    Active member of club or organization: Not on file    Attends meetings of clubs or organizations: Not on file    Relationship status: Not on file  . Intimate partner violence:    Fear of current or ex partner: Not on file    Emotionally abused: Not on file    Physically abused: Not on file    Forced sexual activity: Not on file  Other Topics Concern  . Not on file  Social History Narrative   Married, pt time Control and instrumentation engineer.     Family History: Family History  Problem Relation Age of Onset  . Congestive Heart Failure Father   . Heart disease Father   . Stroke Mother      Review of Systems: All other systems reviewed and are otherwise negative except as noted above.   Physical Exam: VS:  BP 110/76   Pulse 84   Ht _0  (1.702 m)   Wt 145 lb 3.2 oz (65.9 kg)   SpO2 99%   BMI 22.74 kg/m  , BMI Body mass index is 22.74 kg/m.  GEN- The patient is elderly appearing, alert and oriented x 3 today.   HEENT: normocephalic, atraumatic; sclera clear, conjunctiva pink; hearing  intact; oropharynx clear; neck supple  Lungs- Clear to ausculation bilaterally, normal work of breathing.  No wheezes, rales, rhonchi Heart- Irregular rate and rhythm   GI- soft, non-tender, non-distended, bowel sounds present  Extremities- no clubbing, cyanosis, or edema  MS- no significant deformity or atrophy Skin- warm and dry, no rash or lesion; PPM pocket well healed Psych- euthymic mood, full affect Neuro- strength and sensation are intact  PPM Interrogation- reviewed in detail today,  See PACEART report  EKG:  EKG is not ordered today.  Recent  Labs: 10/22/2017: B Natriuretic Peptide 860.4 10/23/2017: ALT 35; Hemoglobin 9.6; Magnesium 2.0; Platelets 142; TSH 2.751 10/24/2017: BUN 20; Creatinine, Ser 1.11; Potassium 3.8; Sodium 141   Wt Readings from Last 3 Encounters:  01/16/18 145 lb 3.2 oz (65.9 kg)  12/16/17 140 lb (63.5 kg)  12/02/17 141 lb 9.6 oz (64.2 kg)      Assessment and Plan:  1.  Sick sinus syndrome Normal PPM function See Pace Art report No changes today  2.  Persistent atrial fibrillation Relatively asymptomatic but some shortness of breath with exertion Continue Eliquis for CHADS2VASC of 3 Heart rates are improved today - 30% of V rates >100, was 60% at last office visit AVN ablation discussed For now, will increase Metoprolol to 18m twice daily and re-evaluate in 6 weeks. She would like to avoid procedures if able.     Current medicines are reviewed at length with the patient today.   The patient does not have concerns regarding her medicines.  The following changes were made today:  Increase Metoprolol to 582mtwice daily  Labs/ tests ordered today include: none No orders of the defined types were placed in this encounter.    Disposition:   Follow up with Dr AlRayann Hemann 6 weeks      Signed, AmChanetta MarshallNP 01/16/2018 9:[place] [phone]) [phone](fax)

## 2018-01-16 NOTE — Patient Instructions (Signed)
Medication Instructions:  Metoprolol Succinate 50 mg twice per day  If you need a refill on your cardiac medications before your next appointment, please call your pharmacy.   Lab work: none If you have labs (blood work) drawn today and your tests are completely normal, you will receive your results only by: Marland Kitchen MyChart Message (if you have MyChart) OR . A paper copy in the mail If you have any lab test that is abnormal or we need to change your treatment, we will call you to review the results.  Testing/Procedures: none  Follow-Up: At [facility], you and your health needs are our priority.  As part of our continuing mission to provide you with exceptional heart care, we have created designated Provider Care Teams.  These Care Teams include your primary Cardiologist (physician) and Advanced Practice Providers (APPs -  Physician Assistants and Nurse Practitioners) who all work together to provide you with the care you need, when you need it. You will need a follow up appointment in 6 weeks.  Please call our office 2 months in advance to schedule this appointment.  You may see Dr Rayann Heman or one of the following Advanced Practice Providers on your designated Care Team:   Chanetta Marshall, NP . Tommye Standard, PA-C  Any Other Special Instructions Will Be Listed Below (If Applicable). Remote monitoring is used to monitor your Pacemaker from home. This monitoring reduces the number of office visits required to check your device to one time per year. It allows [place] to keep an eye on the functioning of your device to ensure it is working properly. You are scheduled for a device check from home on 03/24/18. You may send your transmission at any time that day. If you have a wireless device, the transmission will be sent automatically. After your physician reviews your transmission, you will receive a postcard with your next transmission date.

## 2018-02-01 LAB — CUP PACEART REMOTE DEVICE CHECK
Battery Voltage: 2.79 V
Brady Statistic AP VS Percent: 0 %
Implantable Lead Implant Date: 20150507
Implantable Lead Location: 753859
Implantable Lead Location: 753860
Implantable Lead Model: 5092
Implantable Lead Model: 5592
Lead Channel Impedance Value: 497 Ohm
Lead Channel Pacing Threshold Pulse Width: 0.4 ms
MDC IDC LEAD IMPLANT DT: 20150507
MDC IDC MSMT BATTERY IMPEDANCE: 208 Ohm
MDC IDC MSMT BATTERY REMAINING LONGEVITY: 119 mo
MDC IDC MSMT LEADCHNL RV IMPEDANCE VALUE: 634 Ohm
MDC IDC MSMT LEADCHNL RV PACING THRESHOLD AMPLITUDE: 0.5 V
MDC IDC PG IMPLANT DT: 20150507
MDC IDC SESS DTM: 20191210150028
MDC IDC SET LEADCHNL RA PACING AMPLITUDE: 2 V
MDC IDC SET LEADCHNL RV PACING AMPLITUDE: 2.5 V
MDC IDC SET LEADCHNL RV PACING PULSEWIDTH: 0.4 ms
MDC IDC SET LEADCHNL RV SENSING SENSITIVITY: 4 mV
MDC IDC STAT BRADY AP VP PERCENT: 1 %
MDC IDC STAT BRADY AS VP PERCENT: 5 %
MDC IDC STAT BRADY AS VS PERCENT: 94 %

## 2018-02-13 ENCOUNTER — Telehealth: Payer: Self-pay | Admitting: Internal Medicine

## 2018-02-13 NOTE — Telephone Encounter (Signed)
Patient calling the office for samples of medication:   1.  What medication and dosage are you requesting samples for?  apixaban (ELIQUIS) 5 MG TABS tablet  2.  Are you currently out of this medication?  yes

## 2018-02-13 NOTE — Telephone Encounter (Signed)
Called pt and spoke with pt's son Terie Purser explaining to him that the samples are for pt's just starting on this medication and that I could leave a 2 week supply for pt to pick up and pt's son stated that it was fine that they should have the pt's insurance stuff straighten out by then. I advised the son that if the pt has any other problems, questions or concerns to call the office. Pt's son verbalized understanding.

## 2018-02-18 NOTE — Progress Notes (Addendum)
[facility] Report   Patient Details  Name: Nancy Meadows MRN: [id number] Date of Birth: 15-Mar-1932 Age: 85 y.o. PCP: Nancy Seashore, MD  There were no vitals filed for this visit.   [employer] Eval - 02/18/18 2000      Mobility and Daily Activities   I find it easy to walk up or down two or more flights of stairs.  2    I have no trouble taking out the trash.  4    I do housework such as vacuuming and dusting on my own without difficulty.  3    I can easily lift a gallon of milk (8lbs).  4    I can easily walk a mile.  2    I have no trouble reaching into high cupboards or reaching down to pick up something from the floor.  3    I do not have trouble doing out-door work such as Armed forces logistics/support/administrative officer, raking leaves, or gardening.  1   "I don't do it, I get someone else to do it"     Mobility and Daily Activities   I feel younger than my age.  2    I feel independent.  4    I feel energetic.  2    I live an active life.   3    I feel strong.  3    I feel healthy.  3    I feel active as other people my age.  3      How fit and strong are you.   Fit and Strong Total Score  39      Past Medical History:  Diagnosis Date  . Arthritis    ddd-- occ. pain  . Cholelithiasis   . Eczema   . Goiter   . HLD (hyperlipidemia)   . Hx of cardiovascular stress test    a. ETT-MV 7/14:  Low risk, ant defect c/w soft tissue atten., no ischemia, EF 77%  . Hypothyroid   . Lesion of bladder    hematuria/ frequency  . Paroxysmal atrial fibrillation ([place]) 01/2016   cahds2vasc sore is 3.  consider anticoagulation if afib burden increases  . SDH (subdural hematoma) (HCC)    in the setting of syncope and fall prior to PPM implant, now resolved  . Sick sinus syndrome ([place])   . Syncope    pauses of >4 seconds   Past Surgical History:  Procedure Laterality Date  . ABDOMINAL HYSTERECTOMY  age 50  . APPENDECTOMY  age 53  . bilateral salpingoophectomy  age 51  . CATARACT  EXTRACTION W/ INTRAOCULAR LENS  IMPLANT, BILATERAL  2012  . CYSTOSCOPY WITH BIOPSY  11/21/2010   Procedure: CYSTOSCOPY WITH BIOPSY;  Surgeon: Nancy Hazard, MD;  [facility];  Service: Urology;  Laterality: N/A;  CYSTOSCOPY WITH BLADDER BIOPSY  AND INSTILLATION OF INDIGO CARMINE  . CYSTOSCOPY/RETROGRADE/URETEROSCOPY  11/21/2010   Procedure: CYSTOSCOPY/RETROGRADE/URETEROSCOPY;  Surgeon: Nancy Hazard, MD;  Location: [facility];  Service: Urology;  Laterality: Left;  . gallstones removed  age 72  . LOOP RECORDER IMPLANT N/A 12/31/2012   Procedure: LOOP RECORDER IMPLANT;  Surgeon: Nancy Mark, MD;  Location: [place] CATH LAB;  Service: Cardiovascular;  Laterality: N/A;  . PACEMAKER INSERTION  05-20-2013   MDT ADDRL1 pacemaker implanted by Dr Nancy Meadows for SSS and symptomatic bradycardia  . PERMANENT PACEMAKER INSERTION Left 05/20/2013   Procedure: PERMANENT PACEMAKER INSERTION;  Surgeon: Nancy Rinks  D Allred, MD;  Location: [place] CATH LAB;  Service: Cardiovascular;  Laterality: Left;   Social History   Tobacco Use  Smoking Status Never Smoker  Smokeless Tobacco Never Used  Tobacco Comment   tobacco use - no     Nancy Meadows had perfect attendance to the 12-week, twice weekly PREP class at the [facility].  It was observed that over the 12-weeks, that Nancy Meadows gained confidence, strength, endurance, and stability.  She seemed to enjoy the class and the other participants.  Her Goals and Exit Plan are to continue to exercise by walking/cardio machine 1-2 times a week (Mondays mainly), to rider her own bike at home when weather is bad otherwise and plans to go to the [employer] class on Wed & Fri at 10am.  Her strategy for overcoming her barrier of shortness of breath is to continue to use her incentive spirometer while at home, 3x's a day.  She states she feels stronger & her back hurts less that before she started and she gets less dizzy and shob.      Nancy Meadows 02/18/2018, 8:43 PM

## 2018-03-04 ENCOUNTER — Encounter: Payer: Self-pay | Admitting: Internal Medicine

## 2018-03-04 ENCOUNTER — Encounter (INDEPENDENT_AMBULATORY_CARE_PROVIDER_SITE_OTHER): Payer: Self-pay

## 2018-03-04 ENCOUNTER — Ambulatory Visit (INDEPENDENT_AMBULATORY_CARE_PROVIDER_SITE_OTHER): Payer: Medicare HMO | Admitting: Internal Medicine

## 2018-03-04 VITALS — BP 108/66 | HR 75 | Ht 67.0 in | Wt 141.6 lb

## 2018-03-04 DIAGNOSIS — I4819 Other persistent atrial fibrillation: Secondary | ICD-10-CM

## 2018-03-04 DIAGNOSIS — I495 Sick sinus syndrome: Secondary | ICD-10-CM

## 2018-03-04 MED ORDER — METOPROLOL SUCCINATE ER 100 MG PO TB24: 100.0000 mg | ORAL_TABLET | Freq: Every day | ORAL | 1 refills | Status: DC

## 2018-03-04 NOTE — Progress Notes (Signed)
PCP: Merrilee Seashore, MD   Primary EP:  Dr Rayann Heman  Nancy Meadows is a 85 y.o. female who presents today for routine electrophysiology followup.  Since last being seen in our clinic, the patient reports doing reasonably well.  She continues to have occasional SOB.  V rates remain elevated at times.  Today, she denies symptoms of palpitations, chest pain,  lower extremity edema, dizziness, presyncope, or syncope.  The patient is otherwise without complaint today.   Past Medical History:  Diagnosis Date  . Arthritis    ddd-- occ. pain  . Cholelithiasis   . Eczema   . Goiter   . HLD (hyperlipidemia)   . Hx of cardiovascular stress test    a. ETT-MV 7/14:  Low risk, ant defect c/w soft tissue atten., no ischemia, EF 77%  . Hypothyroid   . Lesion of bladder    hematuria/ frequency  . Paroxysmal atrial fibrillation ([place]) 01/2016   cahds2vasc sore is 3.  consider anticoagulation if afib burden increases  . SDH (subdural hematoma) (HCC)    in the setting of syncope and fall prior to PPM implant, now resolved  . Sick sinus syndrome ([place])   . Syncope    pauses of >4 seconds   Past Surgical History:  Procedure Laterality Date  . ABDOMINAL HYSTERECTOMY  age 44  . APPENDECTOMY  age 45  . bilateral salpingoophectomy  age 22  . CATARACT EXTRACTION W/ INTRAOCULAR LENS  IMPLANT, BILATERAL  2012  . CYSTOSCOPY WITH BIOPSY  11/21/2010   Procedure: CYSTOSCOPY WITH BIOPSY;  Surgeon: Molli Hazard, MD;  Location[facility];  Service: Urology;  Laterality: N/A;  CYSTOSCOPY WITH BLADDER BIOPSY  AND INSTILLATION OF INDIGO CARMINE  . CYSTOSCOPY/RETROGRADE/URETEROSCOPY  11/21/2010   Procedure: CYSTOSCOPY/RETROGRADE/URETEROSCOPY;  Surgeon: Molli Hazard, MD;  Location: [place];  Service: Urology;  Laterality: Left;  . gallstones removed  age 8  . LOOP RECORDER IMPLANT N/A 12/31/2012   Procedure: LOOP RECORDER IMPLANT;  Surgeon: Coralyn Mark,  MD;  Location: [place] CATH LAB;  Service: Cardiovascular;  Laterality: N/A;  . PACEMAKER INSERTION  05-20-2013   MDT ADDRL1 pacemaker implanted by Dr Rayann Heman for SSS and symptomatic bradycardia  . PERMANENT PACEMAKER INSERTION Left 05/20/2013   Procedure: PERMANENT PACEMAKER INSERTION;  Surgeon: Coralyn Mark, MD;  Location: [place] CATH LAB;  Service: Cardiovascular;  Laterality: Left;    ROS- all systems are reviewed and negative except as per HPI above  Current Outpatient Medications  Medication Sig Dispense Refill  . apixaban (ELIQUIS) 5 MG TABS tablet Take 1 tablet (5 mg total) by mouth 2 (two) times daily. 60 tablet 6  . betamethasone valerate ointment (VALISONE) 0.1 % Apply 1 application topically daily as needed (eczema).     . calcium carbonate (TUMS EX) 750 MG chewable tablet Chew 2 tablets by mouth daily.    . furosemide (LASIX) 40 MG tablet Take 40 mg by mouth daily.    Marland Kitchen levothyroxine (SYNTHROID, LEVOTHROID) 88 MCG tablet Take 88 mcg by mouth every morning.     . metoprolol succinate (TOPROL-XL) 50 MG 24 hr tablet Take 1 tablet (50 mg total) by mouth 2 (two) times daily. Take with or immediately following a meal. 180 tablet 3  . Nutritional Supplements (NUTRITIONAL SHAKE PO) Take 237 mLs by mouth daily.    . potassium chloride (K-DUR,KLOR-CON) 10 MEQ tablet Take 10 mEq by mouth 2 (two) times daily.    Marland Kitchen PRESCRIPTION MEDICATION  1 yearly injection into the right eye    . tobramycin (TOBREX) 0.3 % ophthalmic solution Place 1 drop into the right eye See admin instructions. Once monthly, administer 1 drop into right eye four times a day on the day prior to your eye treatment procedure at [facility] and for 1 day after the eye procedure. Repeat this monthly x 4 more treatments.     No current facility-administered medications for this visit.     Physical Exam: Vitals:   03/04/18 1536  BP: 108/66  Pulse: 75  SpO2: 98%  Weight: 141 lb 9.6 oz (64.2 kg)  Height: _0  (1.702 m)     GEN- The patient is well appearing, alert and oriented x 3 today.   Head- normocephalic, atraumatic Eyes-  Sclera clear, conjunctiva pink Ears- hearing intact Oropharynx- clear Lungs- Clear to ausculation bilaterally, normal work of breathing Chest- pacemaker pocket is well healed Heart- irregular rate and rhythm, 2/6 SEM at the apex GI- soft, NT, ND, + BS Extremities- no clubbing, cyanosis, or edema  Pacemaker interrogation- reviewed in detail today,  See PACEART report  ekg tracing ordered today is personally reviewed and shows atrial flutter, Demand V pacing  Assessment and Plan:  1. Symptomatic sinus bradycardia  Normal pacemaker function See Pace Art report No changes today  2. Afib/ atral flut ter She has longstanding persistent atrial arrhythmias Asymptomatic when V rates are controlled.  Unfortunately, V rates are frequently elevated. Increase toprol to 162m BID We discussed AV nodal ablation which she would like to avoid On eliquis for chads2vasc score of 4.  Would plan rate control long term  [facility] Return to see EP NP every 3 months  JThompson GrayerMD, FGeisinger Community Medical Center2/19/2020 3:55 PM

## 2018-03-04 NOTE — Patient Instructions (Addendum)
Medication Instructions:  Your physician has recommended you make the following change in your medication:  1. INCREASE Toprol to 100 mg TWICE daily  *If you need a refill on your cardiac medications before your next appointment, please call your pharmacy*  Labwork: None ordered  Testing/Procedures: None ordered  Follow-Up: Remote monitoring is used to monitor your Pacemaker or ICD from home. This monitoring reduces the number of office visits required to check your device to one time per year. It allows [place] to keep an eye on the functioning of your device to ensure it is working properly. You are scheduled for a device check from home on 03/24/2018. You may send your transmission at any time that day. If you have a wireless device, the transmission will be sent automatically. After your physician reviews your transmission, you will receive a postcard with your next transmission date.  Your physician recommends that you schedule a follow-up appointment in: 3 months with Chanetta Marshall, NP.  Thank you for choosing [facility]!!

## 2018-03-06 LAB — CUP PACEART INCLINIC DEVICE CHECK
Battery Impedance: 233 Ohm
Brady Statistic AP VS Percent: 0 %
Brady Statistic AS VS Percent: 86 %
Date Time Interrogation Session: 20200219205134
Implantable Lead Implant Date: 20150507
Implantable Lead Location: 753860
Implantable Pulse Generator Implant Date: 20150507
Lead Channel Impedance Value: 521 Ohm
Lead Channel Pacing Threshold Amplitude: 0.5 V
Lead Channel Pacing Threshold Amplitude: 2.5 V
Lead Channel Pacing Threshold Pulse Width: 0.06 ms
Lead Channel Pacing Threshold Pulse Width: 0.4 ms
Lead Channel Sensing Intrinsic Amplitude: 11.2 mV
Lead Channel Setting Pacing Amplitude: 2.5 V
Lead Channel Setting Pacing Pulse Width: 0.4 ms
Lead Channel Setting Sensing Sensitivity: 5.6 mV
MDC IDC LEAD IMPLANT DT: 20150507
MDC IDC LEAD LOCATION: 753859
MDC IDC MSMT BATTERY REMAINING LONGEVITY: 114 mo
MDC IDC MSMT BATTERY VOLTAGE: 2.79 V
MDC IDC MSMT LEADCHNL RA SENSING INTR AMPL: 2 mV
MDC IDC MSMT LEADCHNL RV IMPEDANCE VALUE: 561 Ohm
MDC IDC MSMT LEADCHNL RV PACING THRESHOLD AMPLITUDE: 0.5 V
MDC IDC MSMT LEADCHNL RV PACING THRESHOLD PULSEWIDTH: 0.4 ms
MDC IDC SET LEADCHNL RA PACING AMPLITUDE: 2 V
MDC IDC STAT BRADY AP VP PERCENT: 3 %
MDC IDC STAT BRADY AS VP PERCENT: 11 %

## 2018-03-17 DIAGNOSIS — R05 Cough: Secondary | ICD-10-CM | POA: Diagnosis not present

## 2018-03-17 DIAGNOSIS — R0602 Shortness of breath: Secondary | ICD-10-CM | POA: Diagnosis not present

## 2018-03-17 DIAGNOSIS — J189 Pneumonia, unspecified organism: Secondary | ICD-10-CM | POA: Diagnosis not present

## 2018-03-24 ENCOUNTER — Ambulatory Visit (INDEPENDENT_AMBULATORY_CARE_PROVIDER_SITE_OTHER): Payer: Medicare HMO | Admitting: *Deleted

## 2018-03-24 DIAGNOSIS — I482 Chronic atrial fibrillation, unspecified: Secondary | ICD-10-CM | POA: Diagnosis not present

## 2018-03-24 DIAGNOSIS — R05 Cough: Secondary | ICD-10-CM | POA: Diagnosis not present

## 2018-03-24 DIAGNOSIS — I495 Sick sinus syndrome: Secondary | ICD-10-CM | POA: Diagnosis not present

## 2018-03-24 DIAGNOSIS — R0902 Hypoxemia: Secondary | ICD-10-CM | POA: Diagnosis not present

## 2018-03-24 DIAGNOSIS — D638 Anemia in other chronic diseases classified elsewhere: Secondary | ICD-10-CM | POA: Diagnosis not present

## 2018-03-24 DIAGNOSIS — Z79899 Other long term (current) drug therapy: Secondary | ICD-10-CM | POA: Diagnosis not present

## 2018-03-24 DIAGNOSIS — I4821 Permanent atrial fibrillation: Secondary | ICD-10-CM | POA: Diagnosis not present

## 2018-03-24 DIAGNOSIS — R0602 Shortness of breath: Secondary | ICD-10-CM | POA: Diagnosis not present

## 2018-03-25 DIAGNOSIS — H34831 Tributary (branch) retinal vein occlusion, right eye, with macular edema: Secondary | ICD-10-CM | POA: Diagnosis not present

## 2018-03-25 LAB — CUP PACEART REMOTE DEVICE CHECK
Battery Remaining Longevity: 111 mo
Brady Statistic AP VP Percent: 1 %
Brady Statistic AP VS Percent: 0 %
Brady Statistic AS VS Percent: 90 %
Implantable Lead Implant Date: 20150507
Implantable Lead Implant Date: 20150507
Implantable Lead Location: 753859
Implantable Lead Model: 5592
Implantable Pulse Generator Implant Date: 20150507
Lead Channel Impedance Value: 491 Ohm
Lead Channel Impedance Value: 555 Ohm
Lead Channel Pacing Threshold Amplitude: 0.5 V
Lead Channel Setting Pacing Pulse Width: 0.4 ms
MDC IDC LEAD LOCATION: 753860
MDC IDC MSMT BATTERY IMPEDANCE: 257 Ohm
MDC IDC MSMT BATTERY VOLTAGE: 2.79 V
MDC IDC MSMT LEADCHNL RV PACING THRESHOLD PULSEWIDTH: 0.4 ms
MDC IDC SESS DTM: 20200310134453
MDC IDC SET LEADCHNL RA PACING AMPLITUDE: 2 V
MDC IDC SET LEADCHNL RV PACING AMPLITUDE: 2.5 V
MDC IDC SET LEADCHNL RV SENSING SENSITIVITY: 4 mV
MDC IDC STAT BRADY AS VP PERCENT: 9 %

## 2018-03-27 ENCOUNTER — Encounter (HOSPITAL_COMMUNITY): Payer: Self-pay | Admitting: *Deleted

## 2018-03-27 ENCOUNTER — Other Ambulatory Visit: Payer: Self-pay

## 2018-03-27 ENCOUNTER — Observation Stay (HOSPITAL_COMMUNITY)
Admission: EM | Admit: 2018-03-27 | Discharge: 2018-03-28 | Disposition: A | Discharge status: Home / Self Care | Payer: Medicare HMO | Attending: Internal Medicine | Admitting: Internal Medicine

## 2018-03-27 ENCOUNTER — Emergency Department (HOSPITAL_COMMUNITY): Payer: Medicare HMO

## 2018-03-27 DIAGNOSIS — Z950 Presence of cardiac pacemaker: Secondary | ICD-10-CM | POA: Diagnosis not present

## 2018-03-27 DIAGNOSIS — I5033 Acute on chronic diastolic (congestive) heart failure: Principal | ICD-10-CM | POA: Insufficient documentation

## 2018-03-27 DIAGNOSIS — J181 Lobar pneumonia, unspecified organism: Secondary | ICD-10-CM | POA: Diagnosis present

## 2018-03-27 DIAGNOSIS — I480 Paroxysmal atrial fibrillation: Secondary | ICD-10-CM

## 2018-03-27 DIAGNOSIS — R0600 Dyspnea, unspecified: Secondary | ICD-10-CM | POA: Diagnosis present

## 2018-03-27 DIAGNOSIS — J90 Pleural effusion, not elsewhere classified: Secondary | ICD-10-CM | POA: Diagnosis not present

## 2018-03-27 DIAGNOSIS — N183 Chronic kidney disease, stage 3 unspecified: Secondary | ICD-10-CM

## 2018-03-27 DIAGNOSIS — R05 Cough: Secondary | ICD-10-CM | POA: Diagnosis not present

## 2018-03-27 DIAGNOSIS — R0602 Shortness of breath: Secondary | ICD-10-CM | POA: Diagnosis not present

## 2018-03-27 DIAGNOSIS — Z79899 Other long term (current) drug therapy: Secondary | ICD-10-CM | POA: Insufficient documentation

## 2018-03-27 DIAGNOSIS — E039 Hypothyroidism, unspecified: Secondary | ICD-10-CM | POA: Diagnosis present

## 2018-03-27 DIAGNOSIS — Z79890 Hormone replacement therapy (postmenopausal): Secondary | ICD-10-CM | POA: Insufficient documentation

## 2018-03-27 DIAGNOSIS — J168 Pneumonia due to other specified infectious organisms: Secondary | ICD-10-CM | POA: Diagnosis not present

## 2018-03-27 DIAGNOSIS — J189 Pneumonia, unspecified organism: Secondary | ICD-10-CM

## 2018-03-27 DIAGNOSIS — Z7901 Long term (current) use of anticoagulants: Secondary | ICD-10-CM | POA: Diagnosis not present

## 2018-03-27 HISTORY — DX: Dyspnea, unspecified: R06.00

## 2018-03-27 HISTORY — DX: Chronic kidney disease, stage 3 unspecified: N18.30

## 2018-03-27 LAB — CBC WITH DIFFERENTIAL/PLATELET
Abs Immature Granulocytes: 0.02 10*3/uL (ref 0.00–0.07)
BASOS PCT: 1 %
Basophils Absolute: 0 10*3/uL (ref 0.0–0.1)
EOS ABS: 0 10*3/uL (ref 0.0–0.5)
EOS PCT: 1 %
HCT: 32.1 % — ABNORMAL LOW (ref 36.0–46.0)
Hemoglobin: 9.9 g/dL — ABNORMAL LOW (ref 12.0–15.0)
Immature Granulocytes: 0 %
LYMPHS ABS: 1.5 10*3/uL (ref 0.7–4.0)
Lymphocytes Relative: 23 %
MCH: 29.8 pg (ref 26.0–34.0)
MCHC: 30.8 g/dL (ref 30.0–36.0)
MCV: 96.7 fL (ref 80.0–100.0)
Monocytes Absolute: 0.7 10*3/uL (ref 0.1–1.0)
Monocytes Relative: 10 %
NEUTROS PCT: 65 %
Neutro Abs: 4.3 10*3/uL (ref 1.7–7.7)
PLATELETS: 165 10*3/uL (ref 150–400)
RBC: 3.32 MIL/uL — ABNORMAL LOW (ref 3.87–5.11)
RDW: 20.3 % — AB (ref 11.5–15.5)
WBC: 6.6 10*3/uL (ref 4.0–10.5)
nRBC: 0.8 % — ABNORMAL HIGH (ref 0.0–0.2)

## 2018-03-27 LAB — BASIC METABOLIC PANEL
Anion gap: 10 (ref 5–15)
BUN: 28 mg/dL — ABNORMAL HIGH (ref 8–23)
CALCIUM: 8.9 mg/dL (ref 8.9–10.3)
CO2: 22 mmol/L (ref 22–32)
CREATININE: 1.33 mg/dL — AB (ref 0.44–1.00)
Chloride: 101 mmol/L (ref 98–111)
GFR calc Af Amer: 43 mL/min — ABNORMAL LOW (ref >60)
GFR, EST NON AFRICAN AMERICAN: 37 mL/min — AB (ref >60)
Glucose, Bld: 116 mg/dL — ABNORMAL HIGH (ref 70–99)
Potassium: 4.9 mmol/L (ref 3.5–5.1)
Sodium: 133 mmol/L — ABNORMAL LOW (ref 135–145)

## 2018-03-27 LAB — BRAIN NATRIURETIC PEPTIDE: B NATRIURETIC PEPTIDE 5: 875.1 pg/mL — AB (ref 0.0–100.0)

## 2018-03-27 LAB — POCT I-STAT TROPONIN I: TROPONIN I, POC: 0.02 ng/mL (ref 0.00–0.08)

## 2018-03-27 LAB — PROCALCITONIN: Procalcitonin: <0.1 ng/mL

## 2018-03-27 LAB — TSH: TSH: 2.113 u[IU]/mL (ref 0.350–4.500)

## 2018-03-27 MED ORDER — LEVOTHYROXINE SODIUM 88 MCG PO TABS
88.0000 ug | ORAL_TABLET | Freq: Every day | ORAL | Status: DC
  Administered 2018-03-27 – 2018-03-28 (×2): 88 ug via ORAL
  Filled 2018-03-27 (×3): qty 1

## 2018-03-27 MED ORDER — DM-GUAIFENESIN ER 30-600 MG PO TB12
1.0000 | ORAL_TABLET | Freq: Two times a day (BID) | ORAL | Status: DC
  Administered 2018-03-27 – 2018-03-28 (×3): 1 via ORAL
  Filled 2018-03-27 (×3): qty 1

## 2018-03-27 MED ORDER — METOPROLOL SUCCINATE ER 100 MG PO TB24
100.0000 mg | ORAL_TABLET | Freq: Every day | ORAL | Status: DC
  Administered 2018-03-27 – 2018-03-28 (×2): 100 mg via ORAL
  Filled 2018-03-27 (×2): qty 1

## 2018-03-27 MED ORDER — APIXABAN 5 MG PO TABS
5.0000 mg | ORAL_TABLET | Freq: Two times a day (BID) | ORAL | Status: DC
  Administered 2018-03-27 – 2018-03-28 (×3): 5 mg via ORAL
  Filled 2018-03-27 (×3): qty 1

## 2018-03-27 MED ORDER — FUROSEMIDE 10 MG/ML IJ SOLN: 40.0000 mg | Freq: Once | INTRAMUSCULAR | Status: DC

## 2018-03-27 MED ORDER — ALBUTEROL SULFATE (2.5 MG/3ML) 0.083% IN NEBU: 5.0000 mg | INHALATION_SOLUTION | Freq: Once | RESPIRATORY_TRACT | Status: DC

## 2018-03-27 MED ORDER — FUROSEMIDE 10 MG/ML IJ SOLN
40.0000 mg | Freq: Two times a day (BID) | INTRAMUSCULAR | Status: DC
  Administered 2018-03-27 – 2018-03-28 (×3): 40 mg via INTRAVENOUS
  Filled 2018-03-27 (×3): qty 4

## 2018-03-27 MED ORDER — LEVOFLOXACIN IN D5W 500 MG/100ML IV SOLN
500.0000 mg | Freq: Once | INTRAVENOUS | Status: AC
  Administered 2018-03-27: 500 mg via INTRAVENOUS
  Filled 2018-03-27: qty 100

## 2018-03-27 NOTE — Plan of Care (Signed)
Patient known to me from previous admission.  85 year old female lives alone at home admitted with dry cough and shortness of breath.  She was being treated as an outpatient with oral antibiotics which has not helped.  Upon admission her BNP was 875 creatinine 1.33 procalcitonin less than 0.10.  Normal white count hemoglobin 9.9 normal platelets.  Chest x-ray increasing bilateral pleural effusion and basilar atelectasis or infiltration.  Patient does not clinically appear to have bacterial pneumonia patient has no fever no leukocytosis and is nontoxic-appearing.  Moreover she was treated with antibiotics for 6 days as an outpatient.  This appears more like a CHF exacerbation.  Patient takes Lasix 40 mg at home on a daily basis the dose was increased to 40 mg twice a day monitor renal functions daily weights.  When I saw her this morning she was able to speak to me in full sentences her vital signs were stable with a pulse ox of 95% on room air.

## 2018-03-27 NOTE — ED Triage Notes (Signed)
Pt stated "I was SOB and had some wheezing.  I was up here in Dec & had fluid taken off my lungs."

## 2018-03-27 NOTE — ED Notes (Signed)
Pt ambulated independently to bathroom and back.

## 2018-03-27 NOTE — ED Notes (Signed)
ED TO INPATIENT HANDOFF REPORT  ED Nurse Name and Phone #: [phone]  S Name/Age/Gender Nancy Putnam Livers 85 y.o. female Room/Bed: WA16/WA16  Code Status   Code Status: Full Code  Home/SNF/Other Home Patient oriented to: situation Is this baseline? Yes   Triage Complete: Triage complete  Chief Complaint Shortness of breath  Triage Note Pt stated "I was SOB and had some wheezing.  I was up here in Dec & had fluid taken off my lungs."   Allergies Allergies  Allergen Reactions  . Acetaminophen Other (See Comments)    Syncope  . Meclizine Other (See Comments)    Increased dizziness and confusion    Level of Care/Admitting Diagnosis ED Disposition    ED Disposition Condition Comment   Admit  Hospital Area: [place] [[id number]]  Level of Care: Telemetry [5]  Admit to tele based on following criteria: Other see comments  Comments: Afib  Diagnosis: Dyspnea [009233]  Admitting Physician: Shela Leff [[id number]]  Attending Physician: Shela Leff [[id number]]  PT Class (Do Not Modify): Observation [104]  PT Acc Code (Do Not Modify): Observation [10022]       B Medical/Surgery History Past Medical History:  Diagnosis Date  . Arthritis    ddd-- occ. pain  . Cholelithiasis   . Eczema   . Goiter   . HLD (hyperlipidemia)   . Hx of cardiovascular stress test    a. ETT-MV 7/14:  Low risk, ant defect c/w soft tissue atten., no ischemia, EF 77%  . Hypothyroid   . Lesion of bladder    hematuria/ frequency  . Paroxysmal atrial fibrillation ([place]) 01/2016   cahds2vasc sore is 3.  consider anticoagulation if afib burden increases  . SDH (subdural hematoma) (HCC)    in the setting of syncope and fall prior to PPM implant, now resolved  . Sick sinus syndrome ([place])   . Syncope    pauses of >4 seconds   Past Surgical History:  Procedure Laterality Date  . ABDOMINAL HYSTERECTOMY  age 32  . APPENDECTOMY  age 58  . bilateral salpingoophectomy   age 75  . CATARACT EXTRACTION W/ INTRAOCULAR LENS  IMPLANT, BILATERAL  2012  . CYSTOSCOPY WITH BIOPSY  11/21/2010   Procedure: CYSTOSCOPY WITH BIOPSY;  Surgeon: Molli Hazard, MD;  Location: [facility];  Service: Urology;  Laterality: N/A;  CYSTOSCOPY WITH BLADDER BIOPSY  AND INSTILLATION OF INDIGO CARMINE  . CYSTOSCOPY/RETROGRADE/URETEROSCOPY  11/21/2010   Procedure: CYSTOSCOPY/RETROGRADE/URETEROSCOPY;  Surgeon: Molli Hazard, MD;  Location: [facility];  Service: Urology;  Laterality: Left;  . gallstones removed  age 68  . LOOP RECORDER IMPLANT N/A 12/31/2012   Procedure: LOOP RECORDER IMPLANT;  Surgeon: Coralyn Mark, MD;  Location: [place] [facility];  Service: Cardiovascular;  Laterality: N/A;  . PACEMAKER INSERTION  05-20-2013   MDT ADDRL1 pacemaker implanted by Dr Rayann Heman for SSS and symptomatic bradycardia  . PERMANENT PACEMAKER INSERTION Left 05/20/2013   Procedure: PERMANENT PACEMAKER INSERTION;  Surgeon: Coralyn Mark, MD;  Location: [place] CATH [facility];  Service: Cardiovascular;  Laterality: Left;     A IV Location/Drains/Wounds Patient Lines/Drains/Airways Status   Active Line/Drains/Airways    Name:   Placement date:   Placement time:   Site:   Days:   Peripheral IV 03/27/18 Right Arm   03/27/18    0319    Arm   less than 1          Intake/Output Last 24 hours  Intake/Output Summary (Last 24 hours) at 03/27/2018 1532 Last data filed at 03/27/2018 1037 Gross per 24 hour  Intake 120 ml  Output -  Net 120 ml    Labs/Imaging Results for orders placed or performed during the hospital encounter of 03/27/18 (from the past 48 hour(s))  CBC with Differential     Status: Abnormal   Collection Time: 03/27/18  3:19 AM  Result Value Ref Range   WBC 6.6 4.0 - 10.5 K/uL   RBC 3.32 (L) 3.87 - 5.11 MIL/uL   Hemoglobin 9.9 (L) 12.0 - 15.0 g/dL   HCT 32.1 (L) 36.0 - 46.0 %   MCV 96.7 80.0 - 100.0 fL   MCH 29.8 26.0 - 34.0 pg   MCHC 30.8 30.0 - 36.0  g/dL   RDW 20.3 (H) 11.5 - 15.5 %   Platelets 165 150 - 400 K/uL   nRBC 0.8 (H) 0.0 - 0.2 %   Neutrophils Relative % 65 %   Neutro Abs 4.3 1.7 - 7.7 K/uL   Lymphocytes Relative 23 %   Lymphs Abs 1.5 0.7 - 4.0 K/uL   Monocytes Relative 10 %   Monocytes Absolute 0.7 0.1 - 1.0 K/uL   Eosinophils Relative 1 %   Eosinophils Absolute 0.0 0.0 - 0.5 K/uL   Basophils Relative 1 %   Basophils Absolute 0.0 0.0 - 0.1 K/uL   Immature Granulocytes 0 %   Abs Immature Granulocytes 0.02 0.00 - 0.07 K/uL    Comment: Performed at [facility], [place] [address], [place], [place] [address]  Basic metabolic panel     Status: Abnormal   Collection Time: 03/27/18  3:19 AM  Result Value Ref Range   Sodium 133 (L) 135 - 145 mmol/L   Potassium 4.9 3.5 - 5.1 mmol/L   Chloride 101 98 - 111 mmol/L   CO2 22 22 - 32 mmol/L   Glucose, Bld 116 (H) 70 - 99 mg/dL   BUN 28 (H) 8 - 23 mg/dL   Creatinine, Ser 1.33 (H) 0.44 - 1.00 mg/dL   Calcium 8.9 8.9 - 10.3 mg/dL   GFR calc non Af Amer 37 (L) >60 mL/min   GFR calc Af Amer 43 (L) >60 mL/min   Anion gap 10 5 - 15    Comment: Performed at [facility], [place] [address] [place], [place] [address]  Brain natriuretic peptide     Status: Abnormal   Collection Time: 03/27/18  3:19 AM  Result Value Ref Range   B Natriuretic Peptide 875.1 (H) 0.0 - 100.0 pg/mL    Comment: Performed at [facility], [place] [address] [place], [place] [address]  POCT i-Stat troponin I     Status: None   Collection Time: 03/27/18  3:33 AM  Result Value Ref Range   Troponin i, poc 0.02 0.00 - 0.08 ng/mL   Comment 3            Comment: Due to the release kinetics of cTnI, a negative result within the first hours of the onset of symptoms does not rule out myocardial infarction with certainty. If myocardial infarction is still suspected, repeat the test at appropriate intervals.   Procalcitonin - Baseline     Status: None   Collection  Time: 03/27/18  6:38 AM  Result Value Ref Range   Procalcitonin <0.10 ng/mL    Comment:        Interpretation: PCT (Procalcitonin) <= 0.5 ng/mL: Systemic infection (sepsis) is not likely.  Local bacterial infection is possible. (NOTE)       Sepsis PCT Algorithm           Lower Respiratory Tract                                      Infection PCT Algorithm    ----------------------------     ----------------------------         PCT < 0.25 ng/mL                PCT < 0.10 ng/mL         Strongly encourage             Strongly discourage   discontinuation of antibiotics    initiation of antibiotics    ----------------------------     -----------------------------       PCT 0.25 - 0.50 ng/mL            PCT 0.10 - 0.25 ng/mL               OR       >80% decrease in PCT            Discourage initiation of                                            antibiotics      Encourage discontinuation           of antibiotics    ----------------------------     -----------------------------         PCT >= 0.50 ng/mL              PCT 0.26 - 0.50 ng/mL               AND        <80% decrease in PCT             Encourage initiation of                                             antibiotics       Encourage continuation           of antibiotics    ----------------------------     -----------------------------        PCT >= 0.50 ng/mL                  PCT > 0.50 ng/mL               AND         increase in PCT                  Strongly encourage                                      initiation of antibiotics    Strongly encourage escalation           of antibiotics                                     -----------------------------  PCT <= 0.25 ng/mL                                                 OR                                        > 80% decrease in PCT                                     Discontinue / Do not initiate                                              antibiotics Performed at [facility] [address], [place], [place] [address]   TSH     Status: None   Collection Time: 03/27/18  6:38 AM  Result Value Ref Range   TSH 2.113 0.350 - 4.500 uIU/mL    Comment: Performed by a 3rd Generation assay with a functional sensitivity of <=0.01 uIU/mL. Performed at [facility], [place] [address] [place], [place] [address]    Dg Chest 2 View  Result Date: 03/27/2018 CLINICAL DATA:  Cough and shortness of breath for 1 week. EXAM: CHEST - 2 VIEW COMPARISON:  03/17/2018 FINDINGS: Cardiac pacemaker. Cardiac enlargement. There is developing infiltration or atelectasis in the lung bases since previous study. Small bilateral pleural effusions are also increasing. No pneumothorax. Mediastinal contours appear intact. IMPRESSION: Increasing bilateral pleural effusions and basilar atelectasis or infiltration. Electronically Signed   By: Lucienne Capers M.D.   On: 03/27/2018 02:27    Pending Labs Unresulted Labs (From admission, onward)    Start     Ordered   03/28/18 8887  Basic metabolic panel  Tomorrow morning,   R     03/27/18 0611   03/28/18 5797  Basic metabolic panel  Tomorrow morning,   R     03/27/18 0829   03/27/18 0243  Blood culture (routine x 2)  BLOOD CULTURE X 2,   STAT     03/27/18 0242          Vitals/Pain Today's Vitals   03/27/18 1031 03/27/18 1243 03/27/18 1247 03/27/18 1300  BP: 122/79 (!) 115/56  (!) 112/92  Pulse: (!) 110 (!) 111  69  Resp: (!) 24 (!) 26  (!) 22  Temp:      TempSrc:      SpO2: 100% 99%  100%  Weight:      Height:      PainSc:   0-No pain     Isolation Precautions No active isolations  Medications Medications  metoprolol succinate (TOPROL-XL) 24 hr tablet 100 mg (100 mg Oral Given 03/27/18 1031)  levothyroxine (SYNTHROID, LEVOTHROID) tablet 88 mcg (88 mcg Oral Given 03/27/18 0838)  apixaban (ELIQUIS) tablet 5 mg (5 mg Oral Given 03/27/18 1031)  furosemide (LASIX)  injection 40 mg (40 mg Intravenous Given 03/27/18 0839)  dextromethorphan-guaiFENesin (MUCINEX DM) 30-600 MG per 12 hr tablet 1 tablet (1 tablet Oral Given 03/27/18 1031)  levofloxacin (LEVAQUIN) IVPB 500 mg (0 mg Intravenous  Stopped 03/27/18 0453)    Mobility walks Low fall risk   Focused Assessments Pulmonary Assessment Handoff:  Lung sounds: L Breath Sounds: Expiratory wheezes R Breath Sounds: Expiratory wheezes O2 Device: Room Air        R Recommendations: See Admitting Provider Note  Report given to: E[facility] 4E  Additional Notes: Pleural effusion

## 2018-03-27 NOTE — ED Provider Notes (Signed)
TIME SEEN: 2:25 AM  CHIEF COMPLAINT: Shortness of breath, cough  HPI: Patient is an 85 year old female with history of sick sinus syndrome status post pacemaker, atrial fibrillation on Eliquis, hypothyroidism, hyperlipidemia who presents to the emergency department with complaints of cough and shortness of breath.  States she was treated for pneumonia last week and just finished 5-day course of antibiotics.  States tonight she felt significantly more short of breath and continues to have a cough with clear and yellow sputum production.  No documented fevers.  No chest pain.  No lower extremity swelling.  ROS: See HPI Constitutional: no fever  Eyes: no drainage  ENT: no runny nose   Cardiovascular:  no chest pain  Resp: SOB  GI: no vomiting GU: no dysuria Integumentary: no rash  Allergy: no hives  Musculoskeletal: no leg swelling  Neurological: no slurred speech ROS otherwise negative  PAST MEDICAL HISTORY/PAST SURGICAL HISTORY:  Past Medical History:  Diagnosis Date  . Arthritis    ddd-- occ. pain  . Cholelithiasis   . Eczema   . Goiter   . HLD (hyperlipidemia)   . Hx of cardiovascular stress test    a. ETT-MV 7/14:  Low risk, ant defect c/w soft tissue atten., no ischemia, EF 77%  . Hypothyroid   . Lesion of bladder    hematuria/ frequency  . Paroxysmal atrial fibrillation ([place]) 01/2016   cahds2vasc sore is 3.  consider anticoagulation if afib burden increases  . SDH (subdural hematoma) (HCC)    in the setting of syncope and fall prior to PPM implant, now resolved  . Sick sinus syndrome ([place])   . Syncope    pauses of >4 seconds    MEDICATIONS:  Prior to Admission medications   Medication Sig Start Date End Date Taking? Authorizing Provider  apixaban (ELIQUIS) 5 MG TABS tablet Take 1 tablet (5 mg total) by mouth 2 (two) times daily. 08/28/17   Sherran Needs, NP  betamethasone valerate ointment (VALISONE) 0.1 % Apply 1 application topically daily as needed (eczema).   02/11/13   [provider]  calcium carbonate (TUMS EX) 750 MG chewable tablet Chew 2 tablets by mouth daily.    [provider]  furosemide (LASIX) 40 MG tablet Take 40 mg by mouth daily.    [provider]  levothyroxine (SYNTHROID, LEVOTHROID) 88 MCG tablet Take 88 mcg by mouth every morning.     [provider]  metoprolol succinate (TOPROL-XL) 100 MG 24 hr tablet Take 1 tablet (100 mg total) by mouth daily. Take with or immediately following a meal. 03/04/18 06/02/18  Allred, Jeneen Rinks, MD  Nutritional Supplements (NUTRITIONAL SHAKE PO) Take 237 mLs by mouth daily.    [provider]  potassium chloride (K-DUR,KLOR-CON) 10 MEQ tablet Take 10 mEq by mouth 2 (two) times daily.    [provider]  PRESCRIPTION MEDICATION 1 yearly injection into the right eye    [provider]  tobramycin (TOBREX) 0.3 % ophthalmic solution Place 1 drop into the right eye See admin instructions. Once monthly, administer 1 drop into right eye four times a day on the day prior to your eye treatment procedure at [facility] and for 1 day after the eye procedure. Repeat this monthly x 4 more treatments.    [provider]    ALLERGIES:  Allergies  Allergen Reactions  . Acetaminophen Other (See Comments)    Syncope  . Meclizine Other (See Comments)    Increased dizziness and confusion  SOCIAL HISTORY:  Social History   Tobacco Use  . Smoking status: Never Smoker  . Smokeless tobacco: Never Used  . Tobacco comment: tobacco use - no  Substance Use Topics  . Alcohol use: No    FAMILY HISTORY: Family History  Problem Relation Age of Onset  . Congestive Heart Failure Father   . Heart disease Father   . Stroke Mother     EXAM: BP 100/80 (BP Location: Right Arm)   Pulse 93   Temp 97.6 F (36.4 C) (Oral)   Resp 16   Ht 5' 7" (1.702 m)   Wt 68.5 kg   SpO2 98%   BMI 23.65 kg/m  CONSTITUTIONAL: Alert and oriented and  responds appropriately to questions.  Elderly, no distress HEAD: Normocephalic EYES: Conjunctivae clear, pupils appear equal, EOMI ENT: normal nose; moist mucous membranes NECK: Supple, no meningismus, no nuchal rigidity, no LAD  CARD: RRR; S1 and S2 appreciated; no murmurs, no clicks, no rubs, no gallops RESP: Normal chest excursion without splinting or tachypnea; breath sounds clear and equal bilaterally; no wheezes, no rhonchi, no rales, no hypoxia or respiratory distress, speaking full sentences, slightly diminished at bases bilaterally ABD/GI: Normal bowel sounds; non-distended; soft, non-tender, no rebound, no guarding, no peritoneal signs, no hepatosplenomegaly BACK:  The back appears normal and is non-tender to palpation, there is no CVA tenderness EXT: Normal ROM in all joints; non-tender to palpation; no edema; normal capillary refill; no cyanosis, no calf tenderness or swelling    SKIN: Normal color for age and race; warm; no rash NEURO: Moves all extremities equally PSYCH: The patient's mood and manner are appropriate. Grooming and personal hygiene are appropriate.  MEDICAL DECISION MAKING: Patient here with worsening shortness of breath, continued cough after recent diagnosis of pneumonia.  Finished outpatient treatment.  She is afebrile here today.  Chest x-ray shows bilateral small pleural effusions as well as possible left lower lobe infiltrate.  I think that she may have failed outpatient treatment and may need admission.  Will give IV Levaquin.  She has had pleural effusions previously that did improve with diuresis.  She is on Lasix daily.  Does not appear significantly volume overloaded on exam.  ED PROGRESS: Patient has no leukocytosis.  Her troponin is negative but her BNP is elevated.  The pleural effusions may be secondary to volume overload although she does not appear significantly volume overload on exam.  She reports pleural effusions previously have resolved with IV  diuresis.  Will give dose of IV Lasix here.  She has been covered for possible community-acquired pneumonia.  Will discuss with medicine for admission.  5:24 AM Discussed patient's case with hospitalist, Dr. Marlowe Sax.  I have recommended admission and patient (and family if present) agree with this plan. Admitting physician will place admission orders.   I reviewed all nursing notes, vitals, pertinent previous records, EKGs, lab and urine results, imaging (as available).     EKG Interpretation  Date/Time:  Friday March 27 2018 02:30:52 EDT Ventricular Rate:  88 PR Interval:    QRS Duration: 94 QT Interval:  369 QTC Calculation: 481 R Axis:   -6 Text Interpretation:  Atrial fibrillation Low voltage, extremity leads Nonspecific T abnormalities, lateral leads No significant change since last tracing Reconfirmed by , Cyril Mourning [phone]) on 03/27/2018 2:49:18 AM         , Delice Bison, DO 03/27/18 3235

## 2018-03-27 NOTE — ED Notes (Signed)
Pt provided breakfast tray.

## 2018-03-27 NOTE — H&P (Addendum)
History and Physical    Arcadia Gorgas Flener [id number] DOB: Jan 24, 1932 DOA: 03/27/2018  PCP: Merrilee Seashore, MD Patient coming from: Home  Chief Complaint: Shortness of breath, cough  HPI: Nancy Meadows is a 85 y.o. female with medical history significant of paroxysmal atrial fibrillation, sick sinus syndrome status post PPM, hypothyroidism, hyperlipidemia presenting to the hospital for evaluation of shortness of breath and cough.  Patient reports 2-week history of shortness of breath and cough productive of clear sputum.  States she went to see her PCP a week ago and was treated with a 6-day course of an antibiotic.  Symptoms have persisted.  Also reports orthopnea.  Reports compliance with home Lasix.  Denies having any fevers, chills, or chest pain.  Denies any history of blood clots.  Review of Systems: As per HPI otherwise 10 point review of systems negative.  Past Medical History:  Diagnosis Date  . Arthritis    ddd-- occ. pain  . Cholelithiasis   . Eczema   . Goiter   . HLD (hyperlipidemia)   . Hx of cardiovascular stress test    a. ETT-MV 7/14:  Low risk, ant defect c/w soft tissue atten., no ischemia, EF 77%  . Hypothyroid   . Lesion of bladder    hematuria/ frequency  . Paroxysmal atrial fibrillation ([place]) 01/2016   cahds2vasc sore is 3.  consider anticoagulation if afib burden increases  . SDH (subdural hematoma) (HCC)    in the setting of syncope and fall prior to PPM implant, now resolved  . Sick sinus syndrome ([place])   . Syncope    pauses of >4 seconds    Past Surgical History:  Procedure Laterality Date  . ABDOMINAL HYSTERECTOMY  age 85  . APPENDECTOMY  age 27  . bilateral salpingoophectomy  age 61  . CATARACT EXTRACTION W/ INTRAOCULAR LENS  IMPLANT, BILATERAL  2012  . CYSTOSCOPY WITH BIOPSY  11/21/2010   Procedure: CYSTOSCOPY WITH BIOPSY;  Surgeon: Molli Hazard, MD;  Location: [facility];  Service: Urology;  Laterality: N/A;   CYSTOSCOPY WITH BLADDER BIOPSY  AND INSTILLATION OF INDIGO CARMINE  . CYSTOSCOPY/RETROGRADE/URETEROSCOPY  11/21/2010   Procedure: CYSTOSCOPY/RETROGRADE/URETEROSCOPY;  Surgeon: Molli Hazard, MD;  Location: [facility];  Service: Urology;  Laterality: Left;  . gallstones removed  age 63  . LOOP RECORDER IMPLANT N/A 12/31/2012   Procedure: LOOP RECORDER IMPLANT;  Surgeon: Coralyn Mark, MD;  Location: [facility];  Service: Cardiovascular;  Laterality: N/A;  . PACEMAKER INSERTION  05-20-2013   MDT ADDRL1 pacemaker implanted by Dr Rayann Heman for SSS and symptomatic bradycardia  . PERMANENT PACEMAKER INSERTION Left 05/20/2013   Procedure: PERMANENT PACEMAKER INSERTION;  Surgeon: Coralyn Mark, MD;  Location: [facility];  Service: Cardiovascular;  Laterality: Left;     reports that she has never smoked. She has never used smokeless tobacco. She reports that she does not drink alcohol or use drugs.  Allergies  Allergen Reactions  . Acetaminophen Other (See Comments)    Syncope  . Meclizine Other (See Comments)    Increased dizziness and confusion    Family History  Problem Relation Age of Onset  . Congestive Heart Failure Father   . Heart disease Father   . Stroke Mother     Prior to Admission medications   Medication Sig Start Date End Date Taking? Authorizing Provider  apixaban (ELIQUIS) 5 MG TABS tablet Take 1 tablet (5 mg total) by mouth 2 (two) times daily.  08/28/17  Yes Sherran Needs, NP  betamethasone valerate ointment (VALISONE) 0.1 % Apply 1 application topically daily as needed (eczema).  02/11/13  Yes [provider]  furosemide (LASIX) 40 MG tablet Take 40 mg by mouth daily.   Yes [provider]  levothyroxine (SYNTHROID, LEVOTHROID) 88 MCG tablet Take 88 mcg by mouth every morning.    Yes [provider]  metoprolol succinate (TOPROL-XL) 100 MG 24 hr tablet Take 1 tablet (100 mg total) by mouth daily. Take with or immediately  following a meal. 03/04/18 06/02/18 Yes Allred, Jeneen Rinks, MD  Nutritional Supplements (NUTRITIONAL SHAKE PO) Take 237 mLs by mouth daily.   Yes [provider]  potassium chloride (K-DUR,KLOR-CON) 10 MEQ tablet Take 10 mEq by mouth 2 (two) times daily.   Yes [provider]  PRESCRIPTION MEDICATION 1 yearly injection into the right eye   Yes [provider]  tobramycin (TOBREX) 0.3 % ophthalmic solution Place 1 drop into the right eye See admin instructions. Once monthly, administer 1 drop into right eye four times a day on the day prior to your eye treatment procedure at [facility] and for 1 day after the eye procedure. Repeat this monthly x 4 more treatments.   Yes [provider]    Physical Exam: Vitals:   03/27/18 0155 03/27/18 0233 03/27/18 0300  BP: 100/80 97/76 111/87  Pulse: 93 82 (!) 40  Resp: 16 16 (!) 21  Temp: 97.6 F (36.4 C)    TempSrc: Oral    SpO2: 98% 93% 96%  Weight: 68.5 kg    Height: _0  (1.702 m)      Physical Exam  Constitutional: She is oriented to person, place, and time. She appears well-developed and well-nourished. No distress.  HENT:  Head: Normocephalic.  Mouth/Throat: Oropharynx is clear and moist.  Eyes: Right eye exhibits no discharge. Left eye exhibits no discharge.  Neck: Neck supple.  Mild JVD  Cardiovascular: Normal rate and intact distal pulses.  Irregularly irregular rhythm  Pulmonary/Chest: Effort normal and breath sounds normal. No respiratory distress. She has no wheezes. She has no rales.  Abdominal: Soft. Bowel sounds are normal. She exhibits no distension. There is no abdominal tenderness. There is no guarding.  Musculoskeletal:     Comments: +2 pedal edema bilaterally  Neurological: She is alert and oriented to person, place, and time.  Skin: Skin is warm and dry. She is not diaphoretic.     Labs on Admission: I have personally reviewed following labs and imaging studies  CBC: Recent  Labs  Lab 03/27/18 0319  WBC 6.6  NEUTROABS 4.3  HGB 9.9*  HCT 32.1*  MCV 96.7  PLT 102   Basic Metabolic Panel: Recent Labs  Lab 03/27/18 0319  NA 133*  K 4.9  CL 101  CO2 22  GLUCOSE 116*  BUN 28*  CREATININE 1.33*  CALCIUM 8.9   GFR: Estimated Creatinine Clearance: 31.2 mL/min (A) (by C-G formula based on SCr of 1.33 mg/dL (H)). Liver Function Tests: No results for input(s): AST, ALT, ALKPHOS, BILITOT, PROT, ALBUMIN in the last 168 hours. No results for input(s): LIPASE, AMYLASE in the last 168 hours. No results for input(s): AMMONIA in the last 168 hours. Coagulation Profile: No results for input(s): INR, PROTIME in the last 168 hours. Cardiac Enzymes: No results for input(s): CKTOTAL, CKMB, CKMBINDEX, TROPONINI in the last 168 hours. BNP (last 3 results) No results for input(s): PROBNP in the last 8760 hours. HbA1C: No  results for input(s): HGBA1C in the last 72 hours. CBG: No results for input(s): GLUCAP in the last 168 hours. Lipid Profile: No results for input(s): CHOL, HDL, LDLCALC, TRIG, CHOLHDL, LDLDIRECT in the last 72 hours. Thyroid Function Tests: No results for input(s): TSH, T4TOTAL, FREET4, T3FREE, THYROIDAB in the last 72 hours. Anemia Panel: No results for input(s): VITAMINB12, FOLATE, FERRITIN, TIBC, IRON, RETICCTPCT in the last 72 hours. Urine analysis:    Component Value Date/Time   COLORURINE YELLOW 02/09/2012 0017   APPEARANCEUR CLEAR 02/09/2012 0017   LABSPEC 1.014 02/09/2012 0017   PHURINE 5.0 02/09/2012 0017   GLUCOSEU NEGATIVE 02/09/2012 0017   HGBUR MODERATE (A) 02/09/2012 0017   BILIRUBINUR NEGATIVE 02/09/2012 0017   KETONESUR NEGATIVE 02/09/2012 0017   PROTEINUR NEGATIVE 02/09/2012 0017   UROBILINOGEN 0.2 02/09/2012 0017   NITRITE NEGATIVE 02/09/2012 0017   LEUKOCYTESUR NEGATIVE 02/09/2012 0017    Radiological Exams on Admission: Dg Chest 2 View  Result Date: 03/27/2018 CLINICAL DATA:  Cough and shortness of breath for 1  week. EXAM: CHEST - 2 VIEW COMPARISON:  03/17/2018 FINDINGS: Cardiac pacemaker. Cardiac enlargement. There is developing infiltration or atelectasis in the lung bases since previous study. Small bilateral pleural effusions are also increasing. No pneumothorax. Mediastinal contours appear intact. IMPRESSION: Increasing bilateral pleural effusions and basilar atelectasis or infiltration. Electronically Signed   By: Lucienne Capers M.D.   On: 03/27/2018 02:27    EKG: Independently reviewed.  A. fib (heart rate 88).  Assessment/Plan Principal Problem:   Dyspnea Active Problems:   Hypothyroidism   CKD (chronic kidney disease) stage 3, GFR 30-59 ml/min (HCC)   AF (paroxysmal atrial fibrillation) (HCC)   Dyspnea -Chest x-ray showing bibasilar atelectasis versus infiltrate. Received IV Levaquin in the ED.  Pneumonia less likely as patient is afebrile and does not have leukocytosis.  Nontoxic-appearing.  Continues to have dyspnea and cough despite being treated for pneumonia on an outpatient basis. Will hold off ordering additional antibiotics at this time.  Check procalcitonin level.  If elevated, treat for possible pneumonia with antibiotics.   -Mild volume overload could also be contributing to her dyspnea and cough.  Patient does report orthopnea.  However, on exam not significantly volume overloaded.  Reports compliance with home Lasix 40 mg daily.  Chest x-ray showing small bilateral pleural effusions.  Give IV Lasix 40 mg every 12 hours. -PE less likely to explain dyspnea as patient is not hypoxic or tachycardic.  In addition, on full dose anticoagulation for A. fib.  -She does have paroxysmal atrial fibrillation but appears to be rate controlled, less likely to explain her dyspnea.   -ACS less likely to explain dyspnea as point-of-care troponin negative and she is not complaining of chest pain. -Mucinex DM for cough  CKD 3 -BUN 28, creatinine 1.3.  Baseline creatinine around 1.1. -Continue to  monitor renal function with IV diuresis  Paroxysmal atrial fibrillation -Currently rate controlled.  Continue metoprolol and Eliquis.  Hypothyroidism -Continue Synthroid -Check TSH level  DVT prophylaxis: Eliquis Code Status: Patient wishes to be full code. Family Communication: No family available. Disposition Plan: Anticipate discharge after clinical improvement. Consults called: None Admission status: Observation, telemetry   Shela Leff MD [facility] Pager [phone]  If 7PM-7AM, please contact night-coverage [email or web address] Password [facility]  03/27/2018, 6:26 AM

## 2018-03-28 DIAGNOSIS — R0600 Dyspnea, unspecified: Secondary | ICD-10-CM | POA: Diagnosis not present

## 2018-03-28 LAB — BASIC METABOLIC PANEL
Anion gap: 10 (ref 5–15)
BUN: 27 mg/dL — ABNORMAL HIGH (ref 8–23)
CO2: 24 mmol/L (ref 22–32)
Calcium: 9 mg/dL (ref 8.9–10.3)
Chloride: 100 mmol/L (ref 98–111)
Creatinine, Ser: 1.35 mg/dL — ABNORMAL HIGH (ref 0.44–1.00)
GFR calc Af Amer: 42 mL/min — ABNORMAL LOW (ref >60)
GFR, EST NON AFRICAN AMERICAN: 36 mL/min — AB (ref >60)
Glucose, Bld: 108 mg/dL — ABNORMAL HIGH (ref 70–99)
Potassium: 3.9 mmol/L (ref 3.5–5.1)
Sodium: 134 mmol/L — ABNORMAL LOW (ref 135–145)

## 2018-03-28 MED ORDER — METOPROLOL SUCCINATE ER 25 MG PO TB24: 25.0000 mg | ORAL_TABLET | Freq: Every day | ORAL | 11 refills | Status: DC

## 2018-03-28 MED ORDER — FUROSEMIDE 20 MG PO TABS: 20.0000 mg | ORAL_TABLET | Freq: Every day | ORAL | 11 refills | Status: DC

## 2018-03-28 NOTE — Discharge Summary (Signed)
Physician Discharge Summary  Nancy Meadows QRF:[id number] DOB: 1932/05/21 DOA: 03/27/2018  PCP: Merrilee Seashore, MD  Admit date: 03/27/2018 Discharge date: 03/28/2018  Admitted From: Home  disposition: Home  Recommendations for Outpatient Follow-up:  1. Follow up with PCP in 1-2 weeks 2. Please obtain BMP/CBC in one week to follow-up on renal functions 3. Please follow up with cardiology Dr. Rayann Heman  Home Health yes Equipment/Devices: None Discharge Condition: Stable and improved CODE STATUS full code Diet recommendation: Cardiac  Brief/Interim Summary: 85 y.o. female with medical history significant of paroxysmal atrial fibrillation, sick sinus syndrome status post PPM, hypothyroidism, hyperlipidemia presenting to the hospital for evaluation of shortness of breath and cough.  Patient reports 2-week history of shortness of breath and cough productive of clear sputum.  States she went to see her PCP a week ago and was treated with a 6-day course of an antibiotic.  Symptoms have persisted.  Also reports orthopnea.  Reports compliance with home Lasix.  Denies having any fevers, chills, or chest pain.  Denies any history of blood clots.   Discharge Diagnoses:  Principal Problem:   Dyspnea Active Problems:   Hypothyroidism   CKD (chronic kidney disease) stage 3, GFR 30-59 ml/min (HCC)   AF (paroxysmal atrial fibrillation) (HCC)  #1 acute diastolic CHF exacerbation patient admitted with  cough with clear phlegm and dyspnea treated with antibiotics as an outpatient came in with a similar complaints.  Work-up showed she had an elevated BNP of over 800 she was afebrile she was treated in the ER with 1 dose of Levaquin and she was placed on Lasix 40 mg twice a day.  There was no evidence of active pneumonia patient was afebrile procalcitonin was less than 1.0 and she improved with increasing the dose of diuretics.  She was on room air saturation above 92% prior to discharge.  #2 stage III CKD  monitor on diuresis I have told her to follow-up with PCP to recheck her BMP in 1 week.  #3 history of paroxysmal atrial fibrillation I have decreased her dose of metoprolol to 25 mg daily as she was bradycardic on admission did improve upon discharge however heart rate still in the 50s.  #4 hypothyroidism continue Synthroid   Estimated body mass index is 22.17 kg/m as calculated from the following:   Height as of this encounter: _0  (1.702 m).   Weight as of this encounter: 64.2 kg.  Discharge Instructions   Allergies as of 03/28/2018      Reactions   Acetaminophen Other (See Comments)   Syncope   Meclizine Other (See Comments)   Increased dizziness and confusion      Medication List    TAKE these medications   apixaban 5 MG Tabs tablet Commonly known as:  Eliquis Take 1 tablet (5 mg total) by mouth 2 (two) times daily.   betamethasone valerate ointment 0.1 % Commonly known as:  VALISONE Apply 1 application topically daily as needed (eczema).   furosemide 40 MG tablet Commonly known as:  LASIX Take 40 mg by mouth daily. What changed:  Another medication with the same name was added. Make sure you understand how and when to take each.   furosemide 20 MG tablet Commonly known as:  Lasix Take 1 tablet (20 mg total) by mouth daily. Take 1 tablet at 2 PM every day What changed:  You were already taking a medication with the same name, and this prescription was added. Make sure you understand how and when  to take each.   levothyroxine 88 MCG tablet Commonly known as:  SYNTHROID, LEVOTHROID Take 88 mcg by mouth every morning.   metoprolol succinate 25 MG 24 hr tablet Commonly known as:  Toprol XL Take 1 tablet (25 mg total) by mouth daily. What changed:    medication strength  how much to take  additional instructions   NUTRITIONAL SHAKE PO Take 237 mLs by mouth daily.   potassium chloride 10 MEQ tablet Commonly known as:  K-DUR,KLOR-CON Take 10 mEq by mouth  2 (two) times daily.   PRESCRIPTION MEDICATION 1 yearly injection into the right eye   tobramycin 0.3 % ophthalmic solution Commonly known as:  TOBREX Place 1 drop into the right eye See admin instructions. Once monthly, administer 1 drop into right eye four times a day on the day prior to your eye treatment procedure at [facility] and for 1 day after the eye procedure. Repeat this monthly x 4 more treatments.      Follow-up Information    Merrilee Seashore, MD Follow up.   Specialty:  Internal Medicine Contact information: [address] [place] [address] [phone]        Thompson Grayer, MD Follow up.   Specialty:  Cardiology Contact information: [address] [place] [address] [phone]          Allergies  Allergen Reactions  . Acetaminophen Other (See Comments)    Syncope  . Meclizine Other (See Comments)    Increased dizziness and confusion    Consultations:  None   Procedures/Studies: Dg Chest 2 View  Result Date: 03/27/2018 CLINICAL DATA:  Cough and shortness of breath for 1 week. EXAM: CHEST - 2 VIEW COMPARISON:  03/17/2018 FINDINGS: Cardiac pacemaker. Cardiac enlargement. There is developing infiltration or atelectasis in the lung bases since previous study. Small bilateral pleural effusions are also increasing. No pneumothorax. Mediastinal contours appear intact. IMPRESSION: Increasing bilateral pleural effusions and basilar atelectasis or infiltration. Electronically Signed   By: Lucienne Capers M.D.   On: 03/27/2018 02:27   (Echo, Carotid, EGD, Colonoscopy, ERCP)    Subjective: Patient resting in bed in no acute distress felt like she slept the best last night in a long time breathing better on room air vital signs stable  Discharge Exam: Vitals:   03/27/18 2149 03/28/18 0513  BP:  110/72  Pulse: 74 (!) 58  Resp:  18  Temp:  97.8 F (36.6 C)  SpO2:  (!) 88%   Vitals:   03/27/18 2119  03/27/18 2149 03/28/18 0500 03/28/18 0513  BP: 94/64   110/72  Pulse: (!) 36 74  (!) 58  Resp: 18   18  Temp: 98.5 F (36.9 C)   97.8 F (36.6 C)  TempSrc: Oral   Oral  SpO2: 96%   (!) 88%  Weight:   64.2 kg   Height:        General: Pt is alert, awake, not in acute distress Cardiovascular: RRR, S1/S2 +, no rubs, no gallops Respiratory: CTA bilaterally, no wheezing, no rhonchi Abdominal: Soft, NT, ND, bowel sounds + Extremities: no edema, no cyanosis    The results of significant diagnostics from this hospitalization (including imaging, microbiology, ancillary and laboratory) are listed below for reference.     Microbiology: No results found for this or any previous visit (from the past 240 hour(s)).   Labs: BNP (last 3 results) Recent Labs    10/22/17 1734 03/27/18 0319  BNP 860.4*  628.3*   Basic Metabolic Panel: Recent Labs  Lab 03/27/18 0319 03/28/18 0547  NA 133* 134*  K 4.9 3.9  CL 101 100  CO2 22 24  GLUCOSE 116* 108*  BUN 28* 27*  CREATININE 1.33* 1.35*  CALCIUM 8.9 9.0   Liver Function Tests: No results for input(s): AST, ALT, ALKPHOS, BILITOT, PROT, ALBUMIN in the last 168 hours. No results for input(s): LIPASE, AMYLASE in the last 168 hours. No results for input(s): AMMONIA in the last 168 hours. CBC: Recent Labs  Lab 03/27/18 0319  WBC 6.6  NEUTROABS 4.3  HGB 9.9*  HCT 32.1*  MCV 96.7  PLT 165   Cardiac Enzymes: No results for input(s): CKTOTAL, CKMB, CKMBINDEX, TROPONINI in the last 168 hours. BNP: Invalid input(s): POCBNP CBG: No results for input(s): GLUCAP in the last 168 hours. D-Dimer No results for input(s): DDIMER in the last 72 hours. Hgb A1c No results for input(s): HGBA1C in the last 72 hours. Lipid Profile No results for input(s): CHOL, HDL, LDLCALC, TRIG, CHOLHDL, LDLDIRECT in the last 72 hours. Thyroid function studies Recent Labs    03/27/18 0638  TSH 2.113   Anemia work up No results for input(s):  VITAMINB12, FOLATE, FERRITIN, TIBC, IRON, RETICCTPCT in the last 72 hours. Urinalysis    Component Value Date/Time   COLORURINE YELLOW 02/09/2012 0017   APPEARANCEUR CLEAR 02/09/2012 0017   LABSPEC 1.014 02/09/2012 0017   PHURINE 5.0 02/09/2012 0017   GLUCOSEU NEGATIVE 02/09/2012 0017   HGBUR MODERATE (A) 02/09/2012 0017   BILIRUBINUR NEGATIVE 02/09/2012 0017   KETONESUR NEGATIVE 02/09/2012 0017   PROTEINUR NEGATIVE 02/09/2012 0017   UROBILINOGEN 0.2 02/09/2012 0017   NITRITE NEGATIVE 02/09/2012 0017   LEUKOCYTESUR NEGATIVE 02/09/2012 0017   Sepsis Labs Invalid input(s): PROCALCITONIN,  WBC,  LACTICIDVEN Microbiology No results found for this or any previous visit (from the past 240 hour(s)).   Time coordinating discharge:  37 minutes  SIGNED:   Georgette Shell, MD  [facility] 03/28/2018, 8:41 AM Pager   If 7PM-7AM, please contact night-coverage [email or web address] Password [email or web address]

## 2018-03-31 ENCOUNTER — Encounter: Payer: Self-pay | Admitting: Cardiology

## 2018-03-31 NOTE — Progress Notes (Signed)
Remote pacemaker transmission.   

## 2018-04-01 LAB — CULTURE, BLOOD (ROUTINE X 2)
CULTURE: NO GROWTH
Culture: NO GROWTH
Special Requests: ADEQUATE
Special Requests: ADEQUATE

## 2018-04-10 DIAGNOSIS — H34831 Tributary (branch) retinal vein occlusion, right eye, with macular edema: Secondary | ICD-10-CM | POA: Diagnosis not present

## 2018-04-19 ENCOUNTER — Other Ambulatory Visit (HOSPITAL_COMMUNITY): Payer: Self-pay | Admitting: Nurse Practitioner

## 2018-04-20 NOTE — Telephone Encounter (Signed)
Pt is a 85 yr old female who saw Dr. Rayann Heman on 03/04/18, wt at that visit was 64.2Kg. SCr on 03/28/18 was 1.35. Will refill Eliquis for 30mhs.

## 2018-05-20 DIAGNOSIS — E039 Hypothyroidism, unspecified: Secondary | ICD-10-CM | POA: Diagnosis not present

## 2018-05-20 DIAGNOSIS — I4821 Permanent atrial fibrillation: Secondary | ICD-10-CM | POA: Diagnosis not present

## 2018-05-20 DIAGNOSIS — Z7189 Other specified counseling: Secondary | ICD-10-CM | POA: Diagnosis not present

## 2018-05-20 DIAGNOSIS — R5383 Other fatigue: Secondary | ICD-10-CM | POA: Diagnosis not present

## 2018-05-20 DIAGNOSIS — I495 Sick sinus syndrome: Secondary | ICD-10-CM | POA: Diagnosis not present

## 2018-05-20 DIAGNOSIS — Z0000 Encounter for general adult medical examination without abnormal findings: Secondary | ICD-10-CM | POA: Diagnosis not present

## 2018-05-20 DIAGNOSIS — E876 Hypokalemia: Secondary | ICD-10-CM | POA: Diagnosis not present

## 2018-05-20 DIAGNOSIS — I5020 Unspecified systolic (congestive) heart failure: Secondary | ICD-10-CM | POA: Diagnosis not present

## 2018-05-20 DIAGNOSIS — I609 Nontraumatic subarachnoid hemorrhage, unspecified: Secondary | ICD-10-CM | POA: Diagnosis not present

## 2018-05-21 DIAGNOSIS — E039 Hypothyroidism, unspecified: Secondary | ICD-10-CM | POA: Diagnosis not present

## 2018-05-21 DIAGNOSIS — I4821 Permanent atrial fibrillation: Secondary | ICD-10-CM | POA: Diagnosis not present

## 2018-05-21 DIAGNOSIS — I5020 Unspecified systolic (congestive) heart failure: Secondary | ICD-10-CM | POA: Diagnosis not present

## 2018-05-21 DIAGNOSIS — N390 Urinary tract infection, site not specified: Secondary | ICD-10-CM | POA: Diagnosis not present

## 2018-05-27 ENCOUNTER — Telehealth: Payer: Self-pay

## 2018-06-03 NOTE — Telephone Encounter (Signed)
Spoke with pt regarding appt on 06/05/18. Pt stated she will have her son help her with MyChart and give me a call if they have any issues. Pts questions and concerns were address.

## 2018-06-05 ENCOUNTER — Telehealth (INDEPENDENT_AMBULATORY_CARE_PROVIDER_SITE_OTHER): Payer: Medicare HMO | Admitting: Internal Medicine

## 2018-06-05 ENCOUNTER — Encounter: Payer: Self-pay | Admitting: Internal Medicine

## 2018-06-05 VITALS — Ht 67.0 in | Wt 136.2 lb

## 2018-06-05 DIAGNOSIS — I495 Sick sinus syndrome: Secondary | ICD-10-CM

## 2018-06-05 DIAGNOSIS — I4819 Other persistent atrial fibrillation: Secondary | ICD-10-CM | POA: Diagnosis not present

## 2018-06-05 NOTE — Progress Notes (Signed)
Electrophysiology TeleHealth Note   Due to national recommendations of social distancing due to COVID 19, an audio/video telehealth visit is felt to be most appropriate for this patient at this time.  See MyChart message from today for the patient's consent to telehealth for [facility].   Date:  06/05/2018   ID:  Nancy Meadows, DOB 1932/06/18, MRN [id number]  Location: patient's home  Provider location: [address], [place]  Evaluation Performed: Follow-up visit  PCP:  Merrilee Seashore, MD  Cardiologist:  No primary care provider on file.  Electrophysiologist:  Dr Rayann Heman  Chief Complaint:  SOB  History of Present Illness:    Nancy Meadows is a 85 y.o. female who presents via Engineer, civil (consulting) for a telehealth visit today.  Since last being seen in our clinic, the patient reports doing very well. SOB is stable Today, she denies symptoms of palpitations, chest pain,  lower extremity edema, dizziness, presyncope, or syncope.  The patient is otherwise without complaint today.  The patient denies symptoms of fevers, chills, cough, or new SOB worrisome for COVID 19.  Past Medical History:  Diagnosis Date  . Arthritis    ddd-- occ. pain  . Cholelithiasis   . Eczema   . Goiter   . HLD (hyperlipidemia)   . Hx of cardiovascular stress test    a. ETT-MV 7/14:  Low risk, ant defect c/w soft tissue atten., no ischemia, EF 77%  . Hypothyroid   . Lesion of bladder    hematuria/ frequency  . Paroxysmal atrial fibrillation ([place]) 01/2016   cahds2vasc sore is 3.  consider anticoagulation if afib burden increases  . SDH (subdural hematoma) (HCC)    in the setting of syncope and fall prior to PPM implant, now resolved  . Sick sinus syndrome ([place])   . Syncope    pauses of >4 seconds    Past Surgical History:  Procedure Laterality Date  . ABDOMINAL HYSTERECTOMY  age 24  . APPENDECTOMY  age 3  . bilateral salpingoophectomy  age 52  . CATARACT EXTRACTION  W/ INTRAOCULAR LENS  IMPLANT, BILATERAL  2012  . CYSTOSCOPY WITH BIOPSY  11/21/2010   Procedure: CYSTOSCOPY WITH BIOPSY;  Surgeon: Molli Hazard, MD;  Location: [facility];  Service: Urology;  Laterality: N/A;  CYSTOSCOPY WITH BLADDER BIOPSY  AND INSTILLATION OF INDIGO CARMINE  . CYSTOSCOPY/RETROGRADE/URETEROSCOPY  11/21/2010   Procedure: CYSTOSCOPY/RETROGRADE/URETEROSCOPY;  Surgeon: Molli Hazard, MD;  Location: [facility];  Service: Urology;  Laterality: Left;  . gallstones removed  age 7  . LOOP RECORDER IMPLANT N/A 12/31/2012   Procedure: LOOP RECORDER IMPLANT;  Surgeon: Coralyn Mark, MD;  Location: [place] CATH LAB;  Service: Cardiovascular;  Laterality: N/A;  . PACEMAKER INSERTION  05-20-2013   MDT ADDRL1 pacemaker implanted by Dr Rayann Heman for SSS and symptomatic bradycardia  . PERMANENT PACEMAKER INSERTION Left 05/20/2013   Procedure: PERMANENT PACEMAKER INSERTION;  Surgeon: Coralyn Mark, MD;  Location: [place] CATH LAB;  Service: Cardiovascular;  Laterality: Left;    Current Outpatient Medications  Medication Sig Dispense Refill  . betamethasone valerate ointment (VALISONE) 0.1 % Apply 1 application topically daily as needed (eczema).     Marland Kitchen ELIQUIS 5 MG TABS tablet TAKE 1 TABLET BY MOUTH TWICE A DAY 60 tablet 6  . furosemide (LASIX) 20 MG tablet Take 1 tablet (20 mg total) by mouth daily. Take 1 tablet at 2 PM every day 30 tablet 11  .  furosemide (LASIX) 40 MG tablet Take 40 mg by mouth daily.    Marland Kitchen levothyroxine (SYNTHROID, LEVOTHROID) 88 MCG tablet Take 88 mcg by mouth every morning.     . metoprolol succinate (TOPROL XL) 25 MG 24 hr tablet Take 1 tablet (25 mg total) by mouth daily. 30 tablet 11  . Nutritional Supplements (NUTRITIONAL SHAKE PO) Take 237 mLs by mouth daily.    . potassium chloride (K-DUR,KLOR-CON) 10 MEQ tablet Take 10 mEq by mouth 2 (two) times daily.    Marland Kitchen PRESCRIPTION MEDICATION 1 yearly injection into the right eye    .  tobramycin (TOBREX) 0.3 % ophthalmic solution Place 1 drop into the right eye See admin instructions. Once monthly, administer 1 drop into right eye four times a day on the day prior to your eye treatment procedure at [facility] and for 1 day after the eye procedure. Repeat this monthly x 4 more treatments.     No current facility-administered medications for this visit.     Allergies:   Acetaminophen and Meclizine   Social History:  The patient  reports that she has never smoked. She has never used smokeless tobacco. She reports that she does not drink alcohol or use drugs.   Family History:  The patient's  family history includes Congestive Heart Failure in her father; Heart disease in her father; Stroke in her mother.   ROS:  Please see the history of present illness.   All other systems are personally reviewed and negative.    Exam:    Vital Signs:  Ht _0  (1.702 m)   Wt 136 lb 3.2 oz (61.8 kg)   BMI 21.33 kg/m   Well appearing, alert and conversant, regular work of breathing,  good skin color Eyes- anicteric, neuro- grossly intact, skin- no apparent rash or lesions or cyanosis, mouth- oral mucosa is pink   Labs/Other Tests and Data Reviewed:    Recent Labs: 10/23/2017: ALT 35; Magnesium 2.0 03/27/2018: B Natriuretic Peptide 875.1; Hemoglobin 9.9; Platelets 165; TSH 2.113 03/28/2018: BUN 27; Creatinine, Ser 1.35; Potassium 3.9; Sodium 134   Wt Readings from Last 3 Encounters:  06/05/18 136 lb 3.2 oz (61.8 kg)  03/28/18 141 lb 8.6 oz (64.2 kg)  03/04/18 141 lb 9.6 oz (64.2 kg)     Other studies personally reviewed: Additional studies/ records that were reviewed today include: prior office notes,  Recent ER records  Review of the above records today demonstrates: as above  Echo 10/23/17 reviewed  ASSESSMENT & PLAN:    1.  Sick sinus syndrome Normal pacemaker function Remotes are uptodate  2. Permanent afib Rate controlled On eliquis  3. Chronic  diastolic dysfunction Stable No change required today Sodium restriction advised  4. COVID 19 screen The patient denies symptoms of COVID 19 at this time.  The importance of social distancing was discussed today.  Follow-up:  6 months with EP APP Next remote: 06/2018  Current medicines are reviewed at length with the patient today.   The patient does not have concerns regarding her medicines.  The following changes were made today:  none  Labs/ tests ordered today include:  No orders of the defined types were placed in this encounter.   Patient Risk:  after full review of this patients clinical status, I feel that they are at moderate risk at this time.  Today, I have spent 15 minutes with the patient with telehealth technology discussing SOB .    Signed, Thompson Grayer, MD  06/05/2018 3:12 PM     [place] [address] [phone] (office) [phone] (fax)

## 2018-06-23 ENCOUNTER — Ambulatory Visit (INDEPENDENT_AMBULATORY_CARE_PROVIDER_SITE_OTHER): Payer: Medicare HMO | Admitting: *Deleted

## 2018-06-23 DIAGNOSIS — I495 Sick sinus syndrome: Secondary | ICD-10-CM

## 2018-06-23 DIAGNOSIS — I459 Conduction disorder, unspecified: Secondary | ICD-10-CM

## 2018-06-23 LAB — CUP PACEART REMOTE DEVICE CHECK
Battery Impedance: 257 Ohm
Battery Remaining Longevity: 115 mo
Battery Voltage: 2.8 V
Brady Statistic AP VP Percent: 2 %
Brady Statistic AP VS Percent: 0 %
Brady Statistic AS VP Percent: 2 %
Brady Statistic AS VS Percent: 96 %
Date Time Interrogation Session: 20200609130835
Implantable Lead Implant Date: 20150507
Implantable Lead Implant Date: 20150507
Implantable Lead Location: 753859
Implantable Lead Location: 753860
Implantable Lead Model: 5092
Implantable Lead Model: 5592
Implantable Pulse Generator Implant Date: 20150507
Lead Channel Impedance Value: 521 Ohm
Lead Channel Impedance Value: 553 Ohm
Lead Channel Pacing Threshold Amplitude: 0.5 V
Lead Channel Pacing Threshold Pulse Width: 0.4 ms
Lead Channel Setting Pacing Amplitude: 2 V
Lead Channel Setting Pacing Amplitude: 2.5 V
Lead Channel Setting Pacing Pulse Width: 0.4 ms
Lead Channel Setting Sensing Sensitivity: 5.6 mV

## 2018-07-01 NOTE — Progress Notes (Signed)
Remote pacemaker transmission.

## 2018-07-10 DIAGNOSIS — H35362 Drusen (degenerative) of macula, left eye: Secondary | ICD-10-CM | POA: Diagnosis not present

## 2018-07-10 DIAGNOSIS — H3540 Unspecified peripheral retinal degeneration: Secondary | ICD-10-CM | POA: Diagnosis not present

## 2018-07-10 DIAGNOSIS — H34831 Tributary (branch) retinal vein occlusion, right eye, with macular edema: Secondary | ICD-10-CM | POA: Diagnosis not present

## 2018-07-10 DIAGNOSIS — H43813 Vitreous degeneration, bilateral: Secondary | ICD-10-CM | POA: Diagnosis not present

## 2018-09-16 DIAGNOSIS — R3121 Asymptomatic microscopic hematuria: Secondary | ICD-10-CM | POA: Diagnosis not present

## 2018-09-16 DIAGNOSIS — N200 Calculus of kidney: Secondary | ICD-10-CM | POA: Diagnosis not present

## 2018-09-23 ENCOUNTER — Ambulatory Visit (INDEPENDENT_AMBULATORY_CARE_PROVIDER_SITE_OTHER): Payer: Medicare HMO | Admitting: *Deleted

## 2018-09-23 DIAGNOSIS — E039 Hypothyroidism, unspecified: Secondary | ICD-10-CM | POA: Diagnosis not present

## 2018-09-23 DIAGNOSIS — M79671 Pain in right foot: Secondary | ICD-10-CM | POA: Diagnosis not present

## 2018-09-23 DIAGNOSIS — I5020 Unspecified systolic (congestive) heart failure: Secondary | ICD-10-CM | POA: Diagnosis not present

## 2018-09-23 DIAGNOSIS — I609 Nontraumatic subarachnoid hemorrhage, unspecified: Secondary | ICD-10-CM | POA: Diagnosis not present

## 2018-09-23 DIAGNOSIS — I495 Sick sinus syndrome: Secondary | ICD-10-CM

## 2018-09-23 DIAGNOSIS — I480 Paroxysmal atrial fibrillation: Secondary | ICD-10-CM

## 2018-09-23 DIAGNOSIS — E876 Hypokalemia: Secondary | ICD-10-CM | POA: Diagnosis not present

## 2018-09-23 DIAGNOSIS — I4821 Permanent atrial fibrillation: Secondary | ICD-10-CM | POA: Diagnosis not present

## 2018-09-23 DIAGNOSIS — Z7189 Other specified counseling: Secondary | ICD-10-CM | POA: Diagnosis not present

## 2018-09-23 LAB — CUP PACEART REMOTE DEVICE CHECK
Battery Impedance: 281 Ohm
Battery Remaining Longevity: 111 mo
Battery Voltage: 2.79 V
Brady Statistic AP VP Percent: 25 %
Brady Statistic AP VS Percent: 0 %
Brady Statistic AS VP Percent: 9 %
Brady Statistic AS VS Percent: 66 %
Date Time Interrogation Session: 20200909163118
Implantable Lead Implant Date: 20150507
Implantable Lead Implant Date: 20150507
Implantable Lead Location: 753859
Implantable Lead Location: 753860
Implantable Lead Model: 5092
Implantable Lead Model: 5592
Implantable Pulse Generator Implant Date: 20150507
Lead Channel Impedance Value: 519 Ohm
Lead Channel Impedance Value: 588 Ohm
Lead Channel Pacing Threshold Amplitude: 0.625 V
Lead Channel Pacing Threshold Pulse Width: 0.4 ms
Lead Channel Setting Pacing Amplitude: 2 V
Lead Channel Setting Pacing Amplitude: 2.5 V
Lead Channel Setting Pacing Pulse Width: 0.4 ms
Lead Channel Setting Sensing Sensitivity: 5.6 mV

## 2018-10-02 DIAGNOSIS — H43813 Vitreous degeneration, bilateral: Secondary | ICD-10-CM | POA: Diagnosis not present

## 2018-10-02 DIAGNOSIS — H3582 Retinal ischemia: Secondary | ICD-10-CM | POA: Diagnosis not present

## 2018-10-02 DIAGNOSIS — H34831 Tributary (branch) retinal vein occlusion, right eye, with macular edema: Secondary | ICD-10-CM | POA: Diagnosis not present

## 2018-10-02 DIAGNOSIS — H35362 Drusen (degenerative) of macula, left eye: Secondary | ICD-10-CM | POA: Diagnosis not present

## 2018-10-07 NOTE — Progress Notes (Signed)
Remote pacemaker transmission.   

## 2018-11-22 ENCOUNTER — Other Ambulatory Visit (HOSPITAL_COMMUNITY): Payer: Self-pay | Admitting: Internal Medicine

## 2018-11-23 NOTE — Telephone Encounter (Signed)
Pt last saw Dr Rayann Heman 06/05/18 video visit Covid-19, last labs 03/28/18 Creat 1.35, age 85, weight 61.8kg, based on specified criteria pt is on appropriate dosage of Eliquis 44m BID.  Will refill rx.

## 2018-11-24 ENCOUNTER — Other Ambulatory Visit: Payer: Self-pay | Admitting: Internal Medicine

## 2018-11-24 DIAGNOSIS — Z1231 Encounter for screening mammogram for malignant neoplasm of breast: Secondary | ICD-10-CM

## 2018-12-07 NOTE — Progress Notes (Signed)
Electrophysiology Office Note Date: 12/08/2018  ID:  Nancy Meadows, DOB Aug 01, 1932, MRN [id number]  PCP: Merrilee Seashore, MD Primary Cardiologist: No primary care provider on file. Electrophysiologist: Dr. Rayann Heman   CC: Pacemaker follow-up  Nancy Meadows is a 85 y.o. female seen today for Dr. Rayann Heman . she presents today for routine electrophysiology followup.  Since last being seen in our clinic, the patient reports doing well overall. She does occasionally have SOB associated with peripheral edema. She takes lasix 20 mg daily, and occasionally takes 40 mg. She is at times SOB walking around the house. She denies chest pain, PND, orthopnea, nausea, vomiting, dizziness, syncope, weight gain, or early satiety.  She does not miss her Eliquis.   Device History: Medtronic Dual Chamber PPM implanted 05/20/2013 for SSS  Past Medical History:  Diagnosis Date  . Arthritis    ddd-- occ. pain  . Cholelithiasis   . Eczema   . Goiter   . HLD (hyperlipidemia)   . Hx of cardiovascular stress test    a. ETT-MV 7/14:  Low risk, ant defect c/w soft tissue atten., no ischemia, EF 77%  . Hypothyroid   . Lesion of bladder    hematuria/ frequency  . Paroxysmal atrial fibrillation ([place]) 01/2016   cahds2vasc sore is 3.  consider anticoagulation if afib burden increases  . SDH (subdural hematoma) (HCC)    in the setting of syncope and fall prior to PPM implant, now resolved  . Sick sinus syndrome ([place])   . Syncope    pauses of >4 seconds   Past Surgical History:  Procedure Laterality Date  . ABDOMINAL HYSTERECTOMY  age 13  . APPENDECTOMY  age 50  . bilateral salpingoophectomy  age 45  . CATARACT EXTRACTION W/ INTRAOCULAR LENS  IMPLANT, BILATERAL  2012  . CYSTOSCOPY WITH BIOPSY  11/21/2010   Procedure: CYSTOSCOPY WITH BIOPSY;  Surgeon: Molli Hazard, MD;  Location: [facility];  Service: Urology;  Laterality: N/A;  CYSTOSCOPY WITH BLADDER BIOPSY  AND INSTILLATION OF  INDIGO CARMINE  . CYSTOSCOPY/RETROGRADE/URETEROSCOPY  11/21/2010   Procedure: CYSTOSCOPY/RETROGRADE/URETEROSCOPY;  Surgeon: Molli Hazard, MD;  Location: [facility];  Service: Urology;  Laterality: Left;  . gallstones removed  age 64  . LOOP RECORDER IMPLANT N/A 12/31/2012   Procedure: LOOP RECORDER IMPLANT;  Surgeon: Coralyn Mark, MD;  Location: [place] CATH LAB;  Service: Cardiovascular;  Laterality: N/A;  . PACEMAKER INSERTION  05-20-2013   MDT ADDRL1 pacemaker implanted by Dr Rayann Heman for SSS and symptomatic bradycardia  . PERMANENT PACEMAKER INSERTION Left 05/20/2013   Procedure: PERMANENT PACEMAKER INSERTION;  Surgeon: Coralyn Mark, MD;  Location: [place] CATH LAB;  Service: Cardiovascular;  Laterality: Left;    Current Outpatient Medications  Medication Sig Dispense Refill  . betamethasone valerate ointment (VALISONE) 0.1 % Apply 1 application topically daily as needed (eczema).     Marland Kitchen ELIQUIS 5 MG TABS tablet TAKE 1 TABLET BY MOUTH TWICE A DAY 60 tablet 6  . furosemide (LASIX) 20 MG tablet Take 1 tablet (20 mg total) by mouth daily. Take 1 tablet at 2 PM every day 30 tablet 11  . levothyroxine (SYNTHROID, LEVOTHROID) 88 MCG tablet Take 88 mcg by mouth every morning.     . metoprolol succinate (TOPROL XL) 25 MG 24 hr tablet Take 1 tablet (25 mg total) by mouth 2 (two) times daily. 60 tablet 11  . Nutritional Supplements (NUTRITIONAL SHAKE PO) Take 237 mLs by mouth daily.    Marland Kitchen  potassium chloride (KLOR-CON) 10 MEQ tablet Take 1 tablet (10 mEq total) by mouth daily. 90 tablet 3  . PRESCRIPTION MEDICATION 1 yearly injection into the right eye    . tobramycin (TOBREX) 0.3 % ophthalmic solution Place 1 drop into the right eye See admin instructions. Once monthly, administer 1 drop into right eye four times a day on the day prior to your eye treatment procedure at [facility] and for 1 day after the eye procedure. Repeat this monthly x 4 more treatments.     No current  facility-administered medications for this visit.     Allergies:   Acetaminophen and Meclizine   Social History: Social History   Socioeconomic History  . Marital status: Married    Spouse name: Not on file  . Number of children: Not on file  . Years of education: Not on file  . Highest education level: Not on file  Occupational History  . Not on file  Social Needs  . Financial resource strain: Not on file  . Food insecurity    Worry: Not on file    Inability: Not on file  . Transportation needs    Medical: Not on file    Non-medical: Not on file  Tobacco Use  . Smoking status: Never Smoker  . Smokeless tobacco: Never Used  . Tobacco comment: tobacco use - no  Substance and Sexual Activity  . Alcohol use: No  . Drug use: No  . Sexual activity: Not on file  Lifestyle  . Physical activity    Days per week: Not on file    Minutes per session: Not on file  . Stress: Not on file  Relationships  . Social Herbalist on phone: Not on file    Gets together: Not on file    Attends religious service: Not on file    Active member of club or organization: Not on file    Attends meetings of clubs or organizations: Not on file    Relationship status: Not on file  . Intimate partner violence    Fear of current or ex partner: Not on file    Emotionally abused: Not on file    Physically abused: Not on file    Forced sexual activity: Not on file  Other Topics Concern  . Not on file  Social History Narrative   Married, pt time Control and instrumentation engineer.     Family History: Family History  Problem Relation Age of Onset  . Congestive Heart Failure Father   . Heart disease Father   . Stroke Mother      Review of Systems: All other systems reviewed and are otherwise negative except as noted above.  Physical Exam: Vitals:   12/08/18 1052  BP: 114/64  Weight: 139 lb (63 kg)  Height: _0  (1.702 m)     GEN- The patient is well appearing, alert and oriented x 3  today.   HEENT: normocephalic, atraumatic; sclera clear, conjunctiva pink; hearing intact; oropharynx clear; neck supple  Lungs- Clear to ausculation bilaterally, normal work of breathing.  No wheezes, rales, rhonchi Heart- Regular rate and rhythm, no murmurs, rubs or gallops  GI- soft, non-tender, non-distended, bowel sounds present  Extremities- no clubbing, cyanosis, or edema  MS- no significant deformity or atrophy Skin- warm and dry, no rash or lesion; PPM pocket well healed Psych- euthymic mood, full affect Neuro- strength and sensation are intact  PPM Interrogation- reviewed in detail today,  See PACEART  report  EKG:  EKG is not ordered today. Device interrogation reviewed in detail, and performed personally.  Recent Labs: 03/27/2018: B Natriuretic Peptide 875.1; Hemoglobin 9.9; Platelets 165; TSH 2.113 03/28/2018: BUN 27; Creatinine, Ser 1.35; Potassium 3.9; Sodium 134   Wt Readings from Last 3 Encounters:  12/08/18 139 lb (63 kg)  06/05/18 136 lb 3.2 oz (61.8 kg)  03/28/18 141 lb 8.6 oz (64.2 kg)     Other studies Reviewed: Additional studies/ records that were reviewed today include: Echo 10/2017 shows LVEF 50-55%, LA 4.8 cm, Previous EP office notes, Previous remote checks, Most recent labwork.   Assessment and Plan:  1.  Symptomatic bradycardia due to SSS s/p Medtronic PPM  Normal PPM function See Pace Art report No changes today  2. Permanent Afib Rate poorly controlled at this time, as her Toprol was previously decreased after an ED visit.   Continue Eliquis for CHA2DS2VASC of 4   Increase toprol to 25 mg BID. (She was previously on 100 mg BID for a time) Toprol was decreased due to "bradycardia".  Chart shows documented HR of 36 and 40 on 03/27/2018, but appears to be automated, and suspect was erroneous due to irregularity +/- PVCs. All other documented HRs are stable, and her LRL is 60. She has had periods of NSR in the past months, but prior to that has been  permanent AF. Will pursue rate control for now, as suspect she would be unlikely to maintain NSR. Ultimately, could consider AVN ablation, but she has previously refused, and would like to continue to avoid procedures if possible.   3. Chronic diastolic dysfunction Volume status mildly elevated, likely in setting of more poor rate control.  She has previously been symptomatic with poorly controlled AF. Increase rate control as above.  Instructed to continue lasix 20 mg daily, with an extra 20 mg as needed. Continue current K supp.  BMET today.   Current medicines are reviewed at length with the patient today.   The patient does not have concerns regarding her medicines.  The following changes were made today:  none  Labs/ tests ordered today include:  Orders Placed This Encounter  Procedures  . Basic metabolic panel  . CBC    Disposition:   Follow up with EP APP in 2 Months to re-assess.   Jacalyn Lefevre, PA-C  12/08/2018 11:21 AM  [facility] [address] [place]  [address] [phone] (office) [phone] (fax)

## 2018-12-08 ENCOUNTER — Ambulatory Visit (INDEPENDENT_AMBULATORY_CARE_PROVIDER_SITE_OTHER): Payer: Medicare HMO | Admitting: Student

## 2018-12-08 ENCOUNTER — Other Ambulatory Visit: Payer: Self-pay

## 2018-12-08 VITALS — BP 114/64 | Ht 67.0 in | Wt 139.0 lb

## 2018-12-08 DIAGNOSIS — I495 Sick sinus syndrome: Secondary | ICD-10-CM | POA: Diagnosis not present

## 2018-12-08 DIAGNOSIS — I480 Paroxysmal atrial fibrillation: Secondary | ICD-10-CM

## 2018-12-08 LAB — CUP PACEART INCLINIC DEVICE CHECK
Battery Impedance: 281 Ohm
Battery Remaining Longevity: 112 mo
Battery Voltage: 2.79 V
Brady Statistic AP VP Percent: 20 %
Brady Statistic AP VS Percent: 0 %
Brady Statistic AS VP Percent: 8 %
Brady Statistic AS VS Percent: 73 %
Date Time Interrogation Session: 20201124112534
Implantable Lead Implant Date: 20150507
Implantable Lead Implant Date: 20150507
Implantable Lead Location: 753859
Implantable Lead Location: 753860
Implantable Lead Model: 5092
Implantable Lead Model: 5592
Implantable Pulse Generator Implant Date: 20150507
Lead Channel Impedance Value: 491 Ohm
Lead Channel Impedance Value: 531 Ohm
Lead Channel Pacing Threshold Amplitude: 0.5 V
Lead Channel Pacing Threshold Amplitude: 0.75 V
Lead Channel Pacing Threshold Pulse Width: 0.4 ms
Lead Channel Pacing Threshold Pulse Width: 0.4 ms
Lead Channel Setting Pacing Amplitude: 2 V
Lead Channel Setting Pacing Amplitude: 2.5 V
Lead Channel Setting Pacing Pulse Width: 0.4 ms
Lead Channel Setting Sensing Sensitivity: 5.6 mV

## 2018-12-08 MED ORDER — POTASSIUM CHLORIDE CRYS ER 10 MEQ PO TBCR: 10.0000 meq | EXTENDED_RELEASE_TABLET | Freq: Every day | ORAL | 3 refills | Status: DC

## 2018-12-08 MED ORDER — METOPROLOL SUCCINATE ER 25 MG PO TB24: 25.0000 mg | ORAL_TABLET | Freq: Two times a day (BID) | ORAL | 11 refills | Status: DC

## 2018-12-08 NOTE — Patient Instructions (Signed)
Medication Instructions:  . START TAKING TOPROL XL 25 MG TWICE A DAY     *If you need a refill on your cardiac medications before your next appointment, please call your pharmacy*  Lab Work:   BMET AND CBC TODAY   If you have labs (blood work) drawn today and your tests are completely normal, you will receive your results only by: Marland Kitchen MyChart Message (if you have MyChart) OR . A paper copy in the mail If you have any lab test that is abnormal or we need to change your treatment, we will call you to review the results.  Testing/Procedures: NONE ORDERED  TODAY    Follow-Up: At [facility], you and your health needs are our priority.  As part of our continuing mission to provide you with exceptional heart care, we have created designated Provider Care Teams.  These Care Teams include your primary Cardiologist (physician) and Advanced Practice Providers (APPs -  Physician Assistants and Nurse Practitioners) who all work together to provide you with the care you need, when you need it.  Your next appointment:   6 weeks  The format for your next appointment:   In Person  Provider:  Legrand Como "Oda Kilts, PA-C   Other Instructions

## 2018-12-09 LAB — CBC
Hematocrit: 32.6 % — ABNORMAL LOW (ref 34.0–46.6)
Hemoglobin: 10.7 g/dL — ABNORMAL LOW (ref 11.1–15.9)
MCH: 30.5 pg (ref 26.6–33.0)
MCHC: 32.8 g/dL (ref 31.5–35.7)
MCV: 93 fL (ref 79–97)
Platelets: 157 10*3/uL (ref 150–450)
RBC: 3.51 x10E6/uL — ABNORMAL LOW (ref 3.77–5.28)
RDW: 19.2 % — ABNORMAL HIGH (ref 11.7–15.4)
WBC: 5.8 10*3/uL (ref 3.4–10.8)

## 2018-12-09 LAB — BASIC METABOLIC PANEL
BUN/Creatinine Ratio: 12 (ref 12–28)
BUN: 14 mg/dL (ref 8–27)
CO2: 22 mmol/L (ref 20–29)
Calcium: 9.4 mg/dL (ref 8.7–10.3)
Chloride: 107 mmol/L — ABNORMAL HIGH (ref 96–106)
Creatinine, Ser: 1.13 mg/dL — ABNORMAL HIGH (ref 0.57–1.00)
GFR calc Af Amer: 52 mL/min/{1.73_m2} — ABNORMAL LOW (ref >59)
GFR calc non Af Amer: 45 mL/min/{1.73_m2} — ABNORMAL LOW (ref >59)
Glucose: 95 mg/dL (ref 65–99)
Potassium: 3.7 mmol/L (ref 3.5–5.2)
Sodium: 144 mmol/L (ref 134–144)

## 2018-12-16 DIAGNOSIS — Z823 Family history of stroke: Secondary | ICD-10-CM | POA: Diagnosis not present

## 2018-12-16 DIAGNOSIS — R609 Edema, unspecified: Secondary | ICD-10-CM | POA: Diagnosis not present

## 2018-12-16 DIAGNOSIS — E039 Hypothyroidism, unspecified: Secondary | ICD-10-CM | POA: Diagnosis not present

## 2018-12-16 DIAGNOSIS — H04129 Dry eye syndrome of unspecified lacrimal gland: Secondary | ICD-10-CM | POA: Diagnosis not present

## 2018-12-16 DIAGNOSIS — I739 Peripheral vascular disease, unspecified: Secondary | ICD-10-CM | POA: Diagnosis not present

## 2018-12-16 DIAGNOSIS — I4891 Unspecified atrial fibrillation: Secondary | ICD-10-CM | POA: Diagnosis not present

## 2018-12-16 DIAGNOSIS — H3560 Retinal hemorrhage, unspecified eye: Secondary | ICD-10-CM | POA: Diagnosis not present

## 2018-12-16 DIAGNOSIS — L309 Dermatitis, unspecified: Secondary | ICD-10-CM | POA: Diagnosis not present

## 2018-12-16 DIAGNOSIS — Z7901 Long term (current) use of anticoagulants: Secondary | ICD-10-CM | POA: Diagnosis not present

## 2018-12-16 DIAGNOSIS — D6869 Other thrombophilia: Secondary | ICD-10-CM | POA: Diagnosis not present

## 2018-12-16 DIAGNOSIS — Z008 Encounter for other general examination: Secondary | ICD-10-CM | POA: Diagnosis not present

## 2018-12-23 ENCOUNTER — Ambulatory Visit (INDEPENDENT_AMBULATORY_CARE_PROVIDER_SITE_OTHER): Payer: Medicare HMO | Admitting: *Deleted

## 2018-12-23 DIAGNOSIS — I495 Sick sinus syndrome: Secondary | ICD-10-CM | POA: Diagnosis not present

## 2018-12-24 LAB — CUP PACEART REMOTE DEVICE CHECK
Battery Impedance: 306 Ohm
Battery Remaining Longevity: 104 mo
Battery Voltage: 2.79 V
Brady Statistic AP VP Percent: 1 %
Brady Statistic AP VS Percent: 0 %
Brady Statistic AS VP Percent: 1 %
Brady Statistic AS VS Percent: 98 %
Date Time Interrogation Session: 20201210112238
Implantable Lead Implant Date: 20150507
Implantable Lead Implant Date: 20150507
Implantable Lead Location: 753859
Implantable Lead Location: 753860
Implantable Lead Model: 5092
Implantable Lead Model: 5592
Implantable Pulse Generator Implant Date: 20150507
Lead Channel Impedance Value: 505 Ohm
Lead Channel Impedance Value: 552 Ohm
Lead Channel Pacing Threshold Amplitude: 0.5 V
Lead Channel Pacing Threshold Pulse Width: 0.4 ms
Lead Channel Setting Pacing Amplitude: 2 V
Lead Channel Setting Pacing Amplitude: 2.5 V
Lead Channel Setting Pacing Pulse Width: 0.4 ms
Lead Channel Setting Sensing Sensitivity: 4 mV

## 2018-12-25 DIAGNOSIS — H43813 Vitreous degeneration, bilateral: Secondary | ICD-10-CM | POA: Diagnosis not present

## 2018-12-25 DIAGNOSIS — H34831 Tributary (branch) retinal vein occlusion, right eye, with macular edema: Secondary | ICD-10-CM | POA: Diagnosis not present

## 2018-12-25 DIAGNOSIS — H35362 Drusen (degenerative) of macula, left eye: Secondary | ICD-10-CM | POA: Diagnosis not present

## 2018-12-25 DIAGNOSIS — H3561 Retinal hemorrhage, right eye: Secondary | ICD-10-CM | POA: Diagnosis not present

## 2019-01-11 ENCOUNTER — Encounter: Payer: Self-pay | Admitting: *Deleted

## 2019-01-19 NOTE — Progress Notes (Signed)
Cardiology Office Note Date:  01/20/2019  Patient ID:  Nancy Meadows, DOB 1932/04/29, MRN [id number] PCP:  Merrilee Seashore, MD  Cardiologist: Dr. Stanford Breed Electrophysiologist:  Dr. Rayann Heman     Chief Complaint:  planned follow up  History of Present Illness: Nancy Meadows is a 86 y.o. female with history of 2014 syncope resulting in SDH/SAH >> eventual loop noting symptomatic pauses >> PPM, PAT, hypothyroidism, HLD, Permanent AFib, chronic CHF (diastolic).  She comes in today to be seen for Dr. Rayann Heman.  Last seen by him via tele-health visit May 2020, he mentions stable SOB, rate controlled permanent AFin, no changes were made to her regime.  More recently she saw A. [place], [place], Nov 2020.  She was occasionally SOB even around the house, taking her lasix 63m alt with 40 QOD, noting her AF rates poorly controlled her Toprol increased (noting at an ER visit was decreased 2/2 reported rates 36 and 40bpm, suspect to be erroneous given her pacer)  Also notes prior discusions of AV node ablation though the pt wanted to avoid procedures.  Felt to be somewhat volume OL perhaps/likely 2/2 RVR and planned to continue the extra lasix as needed, with a 6 week follow up.   She feels well.  She tells me that she sleeps well, no symptoms of PND or orthopnea, though infrequently and only at night she is occasionally awre of her heart beat sometimes feels fast, others just an awareness of her heart beat.  No CP with these or otherwise.  [place] [facility] [place] [facility], [place] will occasionally make some make some trips in her house for exercise, but says her neighborhood is not safe enough to walk in.  She denies any difficulties with her ADLs though.  She denies any rest SOB, no overt DOE, but doesn't "rush".  No dizzy spells, no near syncope or syncope.   She reports compliance with her Eliquis, no bleeding or signs of bleeding. She is  tolerating the Toprol 264mBID, with no notable change of any kind.   Device History: Medtronic Dual Chamber PPM implanted 05/20/2013 for SSS  Past Medical History:  Diagnosis Date  . Acute head trauma 02/09/2012  . Acute respiratory failure ([facility]10/09/2017  . Anemia 10/22/2017  . Arthritis    ddd-- occ. pain  . Cholelithiasis   . CKD (chronic kidney disease) stage 3, GFR 30-59 ml/min 03/27/2018  . DIZZINESS 03/07/2010   Qualifier: Diagnosis of  By: BaBurnett Kanaris  . Dyspnea 03/27/2018  . Eczema   . Goiter   . HLD (hyperlipidemia)   . Hx of cardiovascular stress test    a. ETT-MV 7/14:  Low risk, ant defect c/w soft tissue atten., no ischemia, EF 77%  . HYPERLIPIDEMIA 03/08/2010   Qualifier: Diagnosis of  By: CrStanford BreedMD, FAKandyce Rud . Hypothyroidism 03/08/2010   Qualifier: Diagnosis of  By: CrStanford BreedMD, FAKandyce Rud . Hypoxia   . Lesion of bladder    hematuria/ frequency  . Palpitations 08/17/2012  . Paroxysmal atrial fibrillation ([facility]CE[place]01/2018   cahds2vasc sore is 3.  consider anticoagulation if afib burden increases  . Pleural effusion, bilateral 10/22/2017  . SAH (subarachnoid hemorrhage) ([facility]1/26/2014  . SDH (subdural hematoma) (HCC)    in the setting of syncope and fall prior to PPM implant, now resolved  . Sick sinus syndrome ([facility]  .  Subdural hematoma, acute ([place]) 02/09/2012  . Syncope    pauses of >4 seconds    Past Surgical History:  Procedure Laterality Date  . ABDOMINAL HYSTERECTOMY  age 43  . APPENDECTOMY  age 20  . bilateral salpingoophectomy  age 57  . CATARACT EXTRACTION W/ INTRAOCULAR LENS  IMPLANT, BILATERAL  2012  . CYSTOSCOPY WITH BIOPSY  11/21/2010   Procedure: CYSTOSCOPY WITH BIOPSY;  Surgeon: Molli Hazard, MD;  Location: [facility];  Service: Urology;  Laterality: N/A;  CYSTOSCOPY WITH BLADDER BIOPSY  AND INSTILLATION OF INDIGO CARMINE  . CYSTOSCOPY/RETROGRADE/URETEROSCOPY  11/21/2010   Procedure:  CYSTOSCOPY/RETROGRADE/URETEROSCOPY;  Surgeon: Molli Hazard, MD;  Location: [facility];  Service: Urology;  Laterality: Left;  . gallstones removed  age 80  . LOOP RECORDER IMPLANT N/A 12/31/2012   Procedure: LOOP RECORDER IMPLANT;  Surgeon: Coralyn Mark, MD;  Location: [facility] CATH [facility];  Service: Cardiovascular;  Laterality: N/A;  . PACEMAKER INSERTION  05-20-2013   MDT ADDRL1 pacemaker implanted by Dr Rayann Heman for SSS and symptomatic bradycardia  . PERMANENT PACEMAKER INSERTION Left 05/20/2013   Procedure: PERMANENT PACEMAKER INSERTION;  Surgeon: Coralyn Mark, MD;  Location: [facility] CATH [facility];  Service: Cardiovascular;  Laterality: Left;    Current Outpatient Medications  Medication Sig Dispense Refill  . betamethasone valerate ointment (VALISONE) 0.1 % Apply 1 application topically daily as needed (eczema).     Marland Kitchen ELIQUIS 5 MG TABS tablet TAKE 1 TABLET BY MOUTH TWICE A DAY 60 tablet 6  . furosemide (LASIX) 20 MG tablet Take 1 tablet (20 mg total) by mouth daily. Take 1 tablet at 2 PM every day 30 tablet 11  . levothyroxine (SYNTHROID, LEVOTHROID) 88 MCG tablet Take 88 mcg by mouth every morning.     . metoprolol succinate (TOPROL XL) 50 MG 24 hr tablet Take 1 tablet (50 mg total) by mouth 2 (two) times daily. 60 tablet 11  . Nutritional Supplements (NUTRITIONAL SHAKE PO) Take 237 mLs by mouth daily.    . potassium chloride (KLOR-CON) 10 MEQ tablet Take 1 tablet (10 mEq total) by mouth daily. 90 tablet 3  . PRESCRIPTION MEDICATION 1 yearly injection into the right eye    . tobramycin (TOBREX) 0.3 % ophthalmic solution Place 1 drop into the right eye See admin instructions. Once monthly, administer 1 drop into right eye four times a day on the day prior to your eye treatment procedure at [facility] and for 1 day after the eye procedure. Repeat this monthly x 4 more treatments.     No current facility-administered medications for this visit.    Allergies:    Acetaminophen and Meclizine   Social History:  The patient  reports that she has never smoked. She has never used smokeless tobacco. She reports that she does not drink alcohol or use drugs.   Family History:  The patient's family history includes Congestive Heart Failure in her father; Heart disease in her father; Stroke in her mother.  ROS:  Please see the history of present illness.    All other systems are reviewed and otherwise negative.   PHYSICAL EXAM:  VS:  BP 130/78   Pulse 99   Ht _0  (1.702 m)   Wt 140 lb (63.5 kg)   SpO2 96%   BMI 21.93 kg/m  BMI: Body mass index is 21.93 kg/m. Well nourished, well developed, in no acute distress  HEENT: normocephalic, atraumatic  Neck: no JVD, carotid bruits or  masses Cardiac: RRR; no significant murmurs, no rubs, or gallops Lungs:  CTA b/l, no wheezing, rhonchi or rales  Abd: soft, nontender MS: no deformity, thin, age appropriate atrophy Ext: no edema  Skin: warm and dry, no rash Neuro:  No gross deficits appreciated Psych: euthymic mood, full affect PPM site is stable, no tethering or discomfort   EKG:  Not done today  PPM interrogation done today and reviewed by myself:  Battery and lead measurements are good AF/VS with some paced beats intermittently on presenting rhythm She is in an ongoing AFib episode, HR 80-90bpm while observing. HR histogram continues to note elevated HR with HR 100-120's, majority are slower HVR episode EGMs reviewed most likely RVR, not velt to be VT   10/23/2017: TTE Study Conclusions - Left ventricle: The cavity size was normal. Systolic function was   normal. The estimated ejection fraction was in the range of 50%   to 55%. Wall motion was normal; there were no regional wall   motion abnormalities. - Mitral valve: Mildly thickened leaflets . There was moderate   regurgitation directed posteriorly. - Left atrium: The atrium was mildly dilated. - Right ventricle: Pacer wire or catheter  noted in right ventricle. - Tricuspid valve: There was mild regurgitation. - Pericardium, extracardiac: There was a left pleural effusion.   07/15/2012: stress myoview  Impression Exercise Capacity:  Poor exercise capacity. (reached 97.89MPHR, 4.6METS) BP Response:  Hypertensive blood pressure response. Clinical Symptoms:  There is dyspnea. ECG Impression:  No significant ST segment change suggestive of ischemia. Comparison with Prior Nuclear Study: No images to compare  Overall Impression:  Low risk stress nuclear study with a moderate size, moderate intensity, fixed anterior defect consistent with soft tissue attenuation; no ischemia..  LV Ejection Fraction: 77%.  LV Wall Motion:  NL LV Function; NL Wall Motion    Recent Labs: 03/27/2018: B Natriuretic Peptide 875.1; TSH 2.113 12/08/2018: BUN 14; Creatinine, Ser 1.13; Hemoglobin 10.7; Platelets 157; Potassium 3.7; Sodium 144  No results found for requested labs within last 8760 hours.   CrCl cannot be calculated (Patient's most recent lab result is older than the maximum 21 days allowed.).   Wt Readings from Last 3 Encounters:  01/20/19 140 lb (63.5 kg)  12/08/18 139 lb (63 kg)  06/05/18 136 lb 3.2 oz (61.8 kg)     Other studies reviewed: Additional studies/records reviewed today include: summarized above  ASSESSMENT AND PLAN:  1. PPM     Intact function  2. Chronic CHF (diastolic)     No overt symptoms, no exam findings to suggest volume OL  3. Permanent AFib     CHA2DS2Vasc is 3, (age/gender), on Eliquis, appropriately dosed     In review of prior remotes/interrogations, she has appeared to be permanent Afib and rate controlled     Of late she has been in/out of AFib, though agree, likely will revert back to a permanent AF     She again does not want to consider ablation (AV node)     She had a down titration of her BB earlier this year     Rate not well controlled, will continue to titrate her BB to her BP  tolerance.     Increase to 37m BID on her Toprol today   4. VHD     Mod MR by echo last year         Disposition: F/u with tele-health visit in 6-8 weeks, with a device transmission the day prior to  re-evaluate HR control.   Current medicines are reviewed at length with the patient today.  The patient did not have any concerns regarding medicines.  Venetia Night, PA-C 01/20/2019 5:03 PM     [place] [address] [place] [address] [phone] (office)  [phone] (fax)

## 2019-01-20 ENCOUNTER — Ambulatory Visit (INDEPENDENT_AMBULATORY_CARE_PROVIDER_SITE_OTHER): Payer: Medicare HMO | Admitting: Physician Assistant

## 2019-01-20 ENCOUNTER — Other Ambulatory Visit: Payer: Self-pay

## 2019-01-20 VITALS — BP 130/78 | HR 99 | Ht 67.0 in | Wt 140.0 lb

## 2019-01-20 DIAGNOSIS — Z950 Presence of cardiac pacemaker: Secondary | ICD-10-CM

## 2019-01-20 DIAGNOSIS — I4811 Longstanding persistent atrial fibrillation: Secondary | ICD-10-CM

## 2019-01-20 DIAGNOSIS — I5032 Chronic diastolic (congestive) heart failure: Secondary | ICD-10-CM | POA: Diagnosis not present

## 2019-01-20 DIAGNOSIS — I340 Nonrheumatic mitral (valve) insufficiency: Secondary | ICD-10-CM

## 2019-01-20 MED ORDER — METOPROLOL SUCCINATE ER 50 MG PO TB24: 50.0000 mg | ORAL_TABLET | Freq: Two times a day (BID) | ORAL | 11 refills | Status: DC

## 2019-01-20 NOTE — Patient Instructions (Addendum)
Medication Instructions:   START TAKING TOPROL 50 MG  TWICE A DAY   *If you need a refill on your cardiac medications before your next appointment, please call your pharmacy*  Lab Work:  NONE ORDERED  TODAY   If you have labs (blood work) drawn today and your tests are completely normal, you will receive your results only by: Marland Kitchen MyChart Message (if you have MyChart) OR . A paper copy in the mail If you have any lab test that is abnormal or we need to change your treatment, we will call you to review the results.  Testing/Procedures: NONE ORDERED  TODAY   Follow-Up: At [facility], you and your health needs are our priority.  As part of our continuing mission to provide you with exceptional heart care, we have created designated Provider Care Teams.  These Care Teams include your primary Cardiologist (physician) and Advanced Practice Providers (APPs -  Physician Assistants and Nurse Practitioners) who all work together to provide you with the care you need, when you need it.  Your next appointment:  IN 6-8 WEEKS TELEPHONE VISIT WITH RENEE URSUY   {Other Instructions

## 2019-01-21 ENCOUNTER — Ambulatory Visit
Admission: RE | Admit: 2019-01-21 | Discharge: 2019-01-21 | Disposition: A | Discharge status: Home / Self Care | Payer: Medicare HMO | Source: Ambulatory Visit | Attending: Internal Medicine | Admitting: Internal Medicine

## 2019-01-21 DIAGNOSIS — Z1231 Encounter for screening mammogram for malignant neoplasm of breast: Secondary | ICD-10-CM

## 2019-02-16 ENCOUNTER — Telehealth: Payer: Self-pay | Admitting: Internal Medicine

## 2019-02-16 NOTE — Telephone Encounter (Signed)
Transmission received 02/16/19. Presenting rhythm AF/VS, VP 90s-140s. Persistent AF since 10/2018, known per EP APP provider notes from 11/2018 and 01/2019. V rates overall controlled. No high ventricular rate episodes.

## 2019-02-16 NOTE — Telephone Encounter (Signed)
Pt c/o medication issue:  1. Name of Medication: metoprolol succinate (TOPROL XL) 50 MG 24 hr tablet  2. How are you currently taking this medication (dosage and times per day)? 1 tablet by mouth 2 times daily  3. Are you having a reaction (difficulty breathing--STAT)? Yes  4. What is your medication issue? Patient's son is calling stating the patient has been experiencing SOB for the last 3 weeks. He states her medication dosage has been increased and didn't know if it could be due to that. The SOB occurs when the patient moves around or bends over. Please advise.

## 2019-02-17 NOTE — Telephone Encounter (Signed)
As she was tolerating Toprol before, with her HF it is much more likely she is holding on to fluid due to being in AF.   I would have her increase her lasix (take 40 mg instead of 20 mg) x 2 days, and take an extra tablet (10 meq) of potassium along with it to see if this improves her SOB.   If her symptoms persist, perhaps she can come into the office for an in person evaluation sooner than scheduled.   Thanks!  Legrand Como [address] [place], PA-C  02/17/2019 8:03 AM

## 2019-02-22 NOTE — Telephone Encounter (Signed)
See MyChart message

## 2019-03-06 NOTE — Progress Notes (Signed)
Virtual Visit via Telephone Note   This visit type was conducted due to national recommendations for restrictions regarding the COVID-19 Pandemic (e.g. social distancing) in an effort to limit this patient's exposure and mitigate transmission in our community.  Due to her co-morbid illnesses, this patient is at least at moderate risk for complications without adequate follow up.  This format is felt to be most appropriate for this patient at this time.  The patient did not have access to video technology/had technical difficulties with video requiring transitioning to audio format only (telephone).  All issues noted in this document were discussed and addressed.  No physical exam could be performed with this format.  Please refer to the patient's chart for her  consent to telehealth for [facility].   Date:  03/08/2019   ID:  Nancy Meadows, DOB [birth year], MRN [id number]  Patient Location: Home Provider Location: Office  PCP:  Merrilee Seashore, MD  Cardiologist:  Dr. Stanford Breed Electrophysiologist:  Dr. Rayann Heman  Evaluation Performed:  Follow-Up Visit  Chief Complaint:  Planned follow up  History of Present Illness:    Nancy Meadows is a 86 y.o. female with history of 2014 syncope resulting in SDH/SAH >> eventual loop noting symptomatic pauses >> PPM, PAT, hypothyroidism, HLD, Permanent AFib, chronic CHF (diastolic).  She comes in today to be seen for Dr. Rayann Heman.  Last seen by him via tele-health visit May 2020, he mentions stable SOB, rate controlled permanent AFin, no changes were made to her regime.  More recently she saw A. [place], [place], Nov 2020.  She was occasionally SOB even around the house, taking her lasix 84m alt with 40 QOD, noting her AF rates poorly controlled her Toprol increased (noting at an ER visit was decreased 2/2 reported rates 36 and 40bpm, suspect to be erroneous given her pacer)  Also notes prior discusions of AV node ablation though the pt wanted to avoid  procedures.  Felt to be somewhat volume OL perhaps/likely 2/2 RVR and planned to continue the extra lasix as needed, with a 6 week follow up.   I saw her Jan 2021, shefelt well.  She told me that she sleeps well, no symptoms of PND or orthopnea, though infrequently and only at night she is occasionally aware of her heart beat sometimes feels fast, others just an awareness of her heart beat.  No CP with these or otherwise.  [place] [facility] [place] [facility], she will occasionally make some make some trips in her house for exercise, but says her neighborhood is not safe enough to walk in.  She denies any difficulties with her ADLs though. She denies any rest SOB, no overt DOE, but doesn't "rush".  No dizzy spells, no near syncope or syncope.  She reported compliance with her Eliquis, no bleeding or signs of bleeding. She was tolerating the Toprol 226mBID, with no notable change of any kind. Device interrogation noted an ongoing AF episode, rates  As high 100-120's, majority were slower Her toprol was increased to 5085mID for better HR control  Subsequently she called with increased SOB felt 2/2 to increased dose of Toprol, transmission noted generally pretty well controlled VR, minimal > 100bpm, she was instructed to take extra lasix for a couple days and monitor her symptoms.   TODAY Today's visit is done with her son Nancy Searinghone conferenced in as well on the phone with us.[place]  The patient identifies herself by name and birthdate. The patient reports feeling better in the last couple weeks.  She took 68m of lasix for a couple weeks, misunderstanding the directions, about a week or so ago she reduced it back to 266mdaily and has since felt better.  The whole time she remained on the 5069mID of Toprol, she did not reduce this. She denies any CP, no sense of palpitations.  No dizzy spells, no near syncope or syncope No bleeding or signs  of bleeding    Device History: MedtronicDual ChamberPPM implanted 5/7/2015for SSS   Past Medical History:  Diagnosis Date  . Acute head trauma 02/09/2012  . Acute respiratory failure ([place]CC[place]0/09/2017  . Anemia 10/22/2017  . Arthritis    ddd-- occ. pain  . Cholelithiasis   . CKD (chronic kidney disease) stage 3, GFR 30-59 ml/min 03/27/2018  . DIZZINESS 03/07/2010   Qualifier: Diagnosis of  By: BarBurnett Kanaris . Dyspnea 03/27/2018  . Eczema   . Goiter   . HLD (hyperlipidemia)   . Hx of cardiovascular stress test    a. ETT-MV 7/14:  Low risk, ant defect c/w soft tissue atten., no ischemia, EF 77%  . HYPERLIPIDEMIA 03/08/2010   Qualifier: Diagnosis of  By: CreStanford BreedD, FACKandyce Rud. Hypothyroidism 03/08/2010   Qualifier: Diagnosis of  By: CreStanford BreedD, FACKandyce Rud. Hypoxia   . Lesion of bladder    hematuria/ frequency  . Palpitations 08/17/2012  . Paroxysmal atrial fibrillation (HCCMitchellville1/2018   cahds2vasc sore is 3.  consider anticoagulation if afib burden increases  . Pleural effusion, bilateral 10/22/2017  . SAH (subarachnoid hemorrhage) ([place]CC[place]/26/2014  . SDH (subdural hematoma) (HCC)    in the setting of syncope and fall prior to PPM implant, now resolved  . Sick sinus syndrome (HCCRough [place] . Subdural hematoma, acute (HCCVandergr[place]/26/2014  . Syncope    pauses of >4 seconds   Past Surgical History:  Procedure Laterality Date  . ABDOMINAL HYSTERECTOMY  age [age] APPENDECTOMY  age [age] bilateral salpingoophectomy  age [age] CATARACT EXTRACTION W/ INTRAOCULAR LENS  IMPLANT, BILATERAL  2012  . CYSTOSCOPY WITH BIOPSY  11/21/2010   Procedure: CYSTOSCOPY WITH BIOPSY;  Surgeon: DanMolli HazardD;  [facility];  Laterality: N/A;  CYSTOSCOPY WITH BLADDER BIOPSY  AND INSTILLATION OF INDIGO CARMINE  . CYSTOSCOPY/RETROGRADE/URETEROSCOPY  11/21/2010   Procedure: CYSTOSCOPY/RETROGRADE/URETEROSCOPY;  Surgeon: DanMolli HazardD;  [facility];  Laterality: Left;  . gallstones removed  age [age] LOOP RECORDER IMPLANT N/A 12/31/2012   Procedure: LOOP RECORDER IMPLANT;  Surgeon: JamCoralyn MarkD;  [facility];  Service: Cardiovascular;  Laterality: N/A;  . PACEMAKER INSERTION  05-20-2013   MDT ADDRL1 pacemaker implanted by Dr AllRayann Hemanr SSS and symptomatic bradycardia  . PERMANENT PACEMAKER INSERTION Left 05/20/2013   Procedure: PERMANENT PACEMAKER INSERTION;  Surgeon: JamCoralyn MarkD;  [facility];  Service: Cardiovascular;  Laterality: Left;     Current Meds  Medication Sig  . betamethasone valerate ointment (VALISONE) 0.1 % Apply 1 application topically daily as needed (eczema).   . EMarland KitchenIQUIS 5 MG TABS tablet TAKE 1 TABLET BY MOUTH TWICE A DAY  . furosemide (LASIX) 20 MG tablet Take 1 tablet (20 mg total) by mouth daily. Take 1 tablet at 2 PM  every day  . levothyroxine (SYNTHROID, LEVOTHROID) 88 MCG tablet Take 88 mcg by mouth every morning.   . metoprolol succinate (TOPROL XL) 50 MG 24 hr tablet Take 1 tablet (50 mg total) by mouth 2 (two) times daily.  . potassium chloride (KLOR-CON) 10 MEQ tablet Take 1 tablet (10 mEq total) by mouth daily.  Marland Kitchen tobramycin (TOBREX) 0.3 % ophthalmic solution Place 1 drop into the right eye See admin instructions. Once monthly, administer 1 drop into right eye four times a day on the day prior to your eye treatment procedure at [facility] [place] [facility] and for 1 day after the eye procedure. Repeat this monthly x 4 more treatments.  . [DISCONTINUED] Nutritional Supplements (NUTRITIONAL SHAKE PO) Take 237 mLs by mouth daily.  . [DISCONTINUED] PRESCRIPTION MEDICATION 1 yearly injection into the right eye     Allergies:   Acetaminophen and Meclizine   Social History   Tobacco Use  . Smoking status: Never Smoker  . Smokeless tobacco: Never Used  . Tobacco comment: tobacco use - no  Substance Use Topics    . Alcohol use: No  . Drug use: No     Family Hx: The patient's family history includes Congestive Heart Failure in her father; Heart disease in her father; Stroke in her mother.  ROS:   Please see the history of present illness.    All other systems reviewed and are negative.   Prior CV studies:   The following studies were reviewed today:  PPM interrogation done 01/20/2019 Battery and lead measurements are good AF/VS with some paced beats intermittently on presenting rhythm She is in an ongoing AFib episode, HR 80-90bpm while observing. HR histogram continues to noted elevated HR with HR 100-120's, majority are slower HVR episode EGMs reviewed most likely RVR, not velt to be VT   10/23/2017: TTE Study Conclusions - Left ventricle: The cavity size was normal. Systolic function was normal. The estimated ejection fraction was in the range of 50% to 55%. Wall motion was normal; there were no regional wall motion abnormalities. - Mitral valve: Mildly thickened leaflets . There was moderate regurgitation directed posteriorly. - Left atrium: The atrium was mildly dilated. - Right ventricle: Pacer wire or catheter noted in right ventricle. - Tricuspid valve: There was mild regurgitation. - Pericardium, extracardiac: There was a left pleural effusion.   07/15/2012: stress myoview  Impression Exercise Capacity: Poor exercise capacity. (reached 97.89MPHR, 4.6METS) BP Response: Hypertensive blood pressure response. Clinical Symptoms: There is dyspnea. ECG Impression: No significant ST segment change suggestive of ischemia. Comparison with Prior Nuclear Study: No images to compare  Overall Impression: Low risk stress nuclear study with a moderate size, moderate intensity, fixed anterior defect consistent with soft tissue attenuation; no ischemia..  LV Ejection Fraction: 77%. LV Wall Motion: NL LV Function; NL Wall Motion    Labs/Other Tests and Data  Reviewed:    EKG:    Recent Labs: 03/27/2018: B Natriuretic Peptide 875.1; TSH 2.113 12/08/2018: BUN 14; Creatinine, Ser 1.13; Hemoglobin 10.7; Platelets 157; Potassium 3.7; Sodium 144   Recent Lipid Panel No results found for: CHOL, TRIG, HDL, CHOLHDL, LDLCALC, LDLDIRECT  Wt Readings from Last 3 Encounters:  03/08/19 139 lb (63 kg)  01/20/19 140 lb (63.5 kg)  12/08/18 139 lb (63 kg)     Objective:    Vital Signs:  Ht _0  (1.702 m)   Wt 139 lb (63 kg)   BMI 21.77 kg/m   Her son will go by her house  later today and check her BP  The patient sounds good on the phone. I do not appreciate and SOB, difficulty in her speech, normal speech pattern. She does not sound in any distress  ASSESSMENT & PLAN:     1. PPM     Intact function by last interrogation  2. Chronic CHF (diastolic)     recent uptitration of her lasix for a couple days     She is back at her baseline Lasix regime     She is feeling well  She did take extra lasix for a week or more, she is asked to come in for a BMET in the next week      3. Permanent AFib     CHA2DS2Vasc is 3, (age/gender), on Eliquis, appropriately dosed     In review of prior remotes/interrogations, she has appeared to be permanent Afib and rate controlled     Of late she has been in/out of AFib, though agree, likely will revert back to a permanent AF (has been since Nov again in AFib     She does not want to consider ablation (AV node)     she still has some HT that are faster, though majority by her last transmission <100, few >100   4. VHD     Mod MR by echo last year    Time:   Today, I have spent 15 minutes with the patient with telehealth technology discussing the above problems.     Medication Adjustments/Labs and Tests Ordered: Current medicines are reviewed at length with the patient today.  Concerns regarding medicines are outlined above.   Tests Ordered: Orders Placed This Encounter  Procedures  . Basic  metabolic panel    Medication Changes: No orders of the defined types were placed in this encounter.   Follow Up:  Q 3 mo remotes and in clinic in 79mo sooner if needed  Signed, RBaldwin Jamaica PHershal Coria 03/08/2019 12:38 PM    CLely

## 2019-03-08 ENCOUNTER — Other Ambulatory Visit: Payer: Self-pay

## 2019-03-08 ENCOUNTER — Telehealth (INDEPENDENT_AMBULATORY_CARE_PROVIDER_SITE_OTHER): Payer: Medicare HMO | Admitting: Physician Assistant

## 2019-03-08 VITALS — Ht 67.0 in | Wt 139.0 lb

## 2019-03-08 DIAGNOSIS — Z79899 Other long term (current) drug therapy: Secondary | ICD-10-CM | POA: Diagnosis not present

## 2019-03-08 DIAGNOSIS — I5032 Chronic diastolic (congestive) heart failure: Secondary | ICD-10-CM | POA: Diagnosis not present

## 2019-03-08 DIAGNOSIS — Z950 Presence of cardiac pacemaker: Secondary | ICD-10-CM | POA: Diagnosis not present

## 2019-03-08 NOTE — Patient Instructions (Signed)
Medication Instructions:   Your physician recommends that you continue on your current medications as directed. Please refer to the Current Medication list given to you today.  *If you need a refill on your cardiac medications before your next appointment, please call your pharmacy*  Lab Work:  BMET  [place]   If you have labs (blood work) drawn today and your tests are completely normal, you will receive your results only by: Marland Kitchen MyChart Message (if you have MyChart) OR . A paper copy in the mail If you have any lab test that is abnormal or we need to change your treatment, we will call you to review the results.  Testing/Procedures: NONE ORDERED  TODAY   Follow-Up: At [facility], you and your health needs are our priority.  As part of our continuing mission to provide you with exceptional heart care, we have created designated Provider Care Teams.  These Care Teams include your primary Cardiologist (physician) and Advanced Practice Providers (APPs -  Physician Assistants and Nurse Practitioners) who all work together to provide you with the care you need, when you need it.  Your next appointment:   6 month(s)  The format for your next appointment:   In Person  Provider:   You may see Dr. Rayann Heman  or one of the following Advanced Practice Providers on your designated Care Team:    Chanetta Marshall, NP  Tommye Standard, PA-C  Legrand Como "Oda [place], [place]   Other Instructions

## 2019-03-11 ENCOUNTER — Ambulatory Visit: Payer: Medicare HMO | Attending: Internal Medicine

## 2019-03-11 DIAGNOSIS — Z23 Encounter for immunization: Secondary | ICD-10-CM

## 2019-03-11 NOTE — Progress Notes (Signed)
   Covid-19 Vaccination Clinic  Name:  Nancy Meadows    MRN: [id number] DOB: 1932-10-29  03/11/2019  Nancy Meadows was observed post Covid-19 immunization for 15 minutes without incidence. She was provided with Vaccine Information Sheet and instruction to access the V-Safe system.   Nancy Meadows was instructed to call 911 with any severe reactions post vaccine: Marland Kitchen Difficulty breathing  . Swelling of your face and throat  . A fast heartbeat  . A bad rash all over your body  . Dizziness and weakness    Immunizations Administered    Name Date Dose VIS Date Route   Pfizer COVID-19 Vaccine 03/11/2019  3:13 PM 0.3 mL 12/25/2018 Intramuscular   Manufacturer: [place]   Lot: [id number]   [place]: [id number]

## 2019-03-16 ENCOUNTER — Other Ambulatory Visit: Payer: Self-pay

## 2019-03-16 ENCOUNTER — Other Ambulatory Visit: Payer: Medicare HMO

## 2019-03-16 DIAGNOSIS — Z79899 Other long term (current) drug therapy: Secondary | ICD-10-CM | POA: Diagnosis not present

## 2019-03-16 LAB — BASIC METABOLIC PANEL
BUN/Creatinine Ratio: 12 (ref 12–28)
BUN: 16 mg/dL (ref 8–27)
CO2: 21 mmol/L (ref 20–29)
Calcium: 9.1 mg/dL (ref 8.7–10.3)
Chloride: 102 mmol/L (ref 96–106)
Creatinine, Ser: 1.33 mg/dL — ABNORMAL HIGH (ref 0.57–1.00)
GFR calc Af Amer: 42 mL/min/{1.73_m2} — ABNORMAL LOW (ref >59)
GFR calc non Af Amer: 37 mL/min/{1.73_m2} — ABNORMAL LOW (ref >59)
Glucose: 146 mg/dL — ABNORMAL HIGH (ref 65–99)
Potassium: 4.3 mmol/L (ref 3.5–5.2)
Sodium: 136 mmol/L (ref 134–144)

## 2019-03-24 ENCOUNTER — Ambulatory Visit (INDEPENDENT_AMBULATORY_CARE_PROVIDER_SITE_OTHER): Payer: Medicare HMO | Admitting: *Deleted

## 2019-03-24 DIAGNOSIS — I495 Sick sinus syndrome: Secondary | ICD-10-CM

## 2019-03-24 DIAGNOSIS — R197 Diarrhea, unspecified: Secondary | ICD-10-CM | POA: Diagnosis not present

## 2019-03-24 DIAGNOSIS — R110 Nausea: Secondary | ICD-10-CM | POA: Diagnosis not present

## 2019-03-25 LAB — CUP PACEART REMOTE DEVICE CHECK
Battery Impedance: 330 Ohm
Battery Remaining Longevity: 102 mo
Battery Voltage: 2.79 V
Brady Statistic AP VP Percent: 2 %
Brady Statistic AP VS Percent: 0 %
Brady Statistic AS VP Percent: 3 %
Brady Statistic AS VS Percent: 94 %
Date Time Interrogation Session: 20210310161734
Implantable Lead Implant Date: 20150507
Implantable Lead Implant Date: 20150507
Implantable Lead Location: 753859
Implantable Lead Location: 753860
Implantable Lead Model: 5092
Implantable Lead Model: 5592
Implantable Pulse Generator Implant Date: 20150507
Lead Channel Impedance Value: 476 Ohm
Lead Channel Impedance Value: 527 Ohm
Lead Channel Pacing Threshold Amplitude: 0.625 V
Lead Channel Pacing Threshold Pulse Width: 0.4 ms
Lead Channel Setting Pacing Amplitude: 2 V
Lead Channel Setting Pacing Amplitude: 2.5 V
Lead Channel Setting Pacing Pulse Width: 0.4 ms
Lead Channel Setting Sensing Sensitivity: 4 mV

## 2019-03-25 NOTE — Progress Notes (Signed)
PPM Remote  

## 2019-04-04 NOTE — Progress Notes (Deleted)
Cardiology Office Note Date:  04/04/2019  Patient ID:  Nancy Meadows, DOB December 19, 1932, MRN [id number] PCP:  Merrilee Seashore, MD  Cardiologist: Dr. Stanford Breed Electrophysiologist:  Dr. Rayann Heman     Chief Complaint:  planned follow up  History of Present Illness: Nancy Meadows is a 86 y.o. female with history of 2014 syncope resulting in SDH/SAH >> eventual loop noting symptomatic pauses >> PPM, PAT, hypothyroidism, HLD, Permanent AFib, chronic CHF (diastolic).  She comes in today to be seen for Dr. Rayann Heman.  Last seen by him via tele-health visit May 2020, he mentioned stable SOB, rate controlled permanent AFib, no changes were made to her regime.  She saw A. Charlotte Hall, [place], Nov 2020.  She was occasionally SOB even around the house, taking her lasix 75m alt with 40 QOD, noting her AF rates poorly controlled her Toprol increased (noting at an ER visit was decreased 2/2 reported rates 36 and 40bpm, suspect to be erroneous given her pacer)  Also notes prior discusions of AV node ablation though the pt wanted to avoid procedures.  Felt to be somewhat volume OL perhaps/likely 2/2 RVR and planned to continue the extra lasix as needed, with a 6 week follow up.   I saw her 01/20/2019  She told me that she slept well, no symptoms of PND or orthopnea, though infrequently and only at night she was occasionally aware of her heart beat sometimes feels fast, others just an awareness of her heart beat.  No CP with these or otherwise.  [facility], [place] regular [place], she will occasionally make some make some trips in her house for exercise, but says her neighborhood is not safe enough to walk in.  She denied any difficulties with her ADLs though.  She denied any rest SOB, no overt DOE, but doesn't "rush".  No dizzy spells, no near syncope or syncope.  She reports compliance with her Eliquis, no bleeding or signs of bleeding. She iwa tolerating  the Toprol 25mBID, with no notable change of any kind. HR histogram noted rates 100-120's and her Toprol increased to 5025mID She was not felt to be volume OL  03/08/2019: F/u tele health visit noted phone notes with some SOB, rates looked better on transmission and was instructed to tale extra lasix for a couple days, on the day of our phone visit (with her son conferenced in) She had taken extra lasix apparently for a couple weeks misunderstanding the directions, was back to 26m7mily and feeling better She had not wanted to consider AV node ablation by prior visits, she was feeling better, HR were improved with less >100 then previously noted, planned for labs and 3 mo follow up  The patient's daughter sent a mychart message 03/29/2019 with concerns that she may be having symptoms after 1st covid shot, uncertain what dose metoprolol she was to be taking, noted swelling feet, and if she could/should have 2nd vaccine. Dr. AllrRayann Hemanressed via mychDeloris Ping his RN. Recommended that she complete her vaccination and come in for evaluation.   *** HR *** symptoms, volume *** increase lasix likely *** echo [id number] ok, diastolic   Device History: Medtronic Dual Chamber PPM implanted 05/20/2013 for SSS  Past Medical History:  Diagnosis Date  . Acute head trauma 02/09/2012  . Acute respiratory failure (HCC)Alexander/09/2017  . Anemia 10/22/2017  . Arthritis    ddd-- occ. pain  .  Cholelithiasis   . CKD (chronic kidney disease) stage 3, GFR 30-59 ml/min 03/27/2018  . DIZZINESS 03/07/2010   Qualifier: Diagnosis of  By: Burnett Kanaris    . Dyspnea 03/27/2018  . Eczema   . Goiter   . HLD (hyperlipidemia)   . Hx of cardiovascular stress test    a. ETT-MV 7/14:  Low risk, ant defect c/w soft tissue atten., no ischemia, EF 77%  . HYPERLIPIDEMIA 03/08/2010   Qualifier: Diagnosis of  By: Stanford Breed, MD, Kandyce Rud   . Hypothyroidism 03/08/2010   Qualifier: Diagnosis of  By: Stanford Breed, MD, Kandyce Rud   . Hypoxia   . Lesion of bladder    hematuria/ frequency  . Palpitations 08/17/2012  . Paroxysmal atrial fibrillation ([place]) 01/2016   cahds2vasc sore is 3.  consider anticoagulation if afib burden increases  . Pleural effusion, bilateral 10/22/2017  . SAH (subarachnoid hemorrhage) ([place]) 02/09/2012  . SDH (subdural hematoma) (HCC)    in the setting of syncope and fall prior to PPM implant, now resolved  . Sick sinus syndrome ([place])   . Subdural hematoma, acute ([place]) 02/09/2012  . Syncope    pauses of >4 seconds    Past Surgical History:  Procedure Laterality Date  . ABDOMINAL HYSTERECTOMY  age 71  . APPENDECTOMY  age 81  . bilateral salpingoophectomy  age 61  . CATARACT EXTRACTION W/ INTRAOCULAR LENS  IMPLANT, BILATERAL  2012  . CYSTOSCOPY WITH BIOPSY  11/21/2010   Procedure: CYSTOSCOPY WITH BIOPSY;  Surgeon: Molli Hazard, MD;  [facility];  Service: Urology;  Laterality: N/A;  CYSTOSCOPY WITH BLADDER BIOPSY  AND INSTILLATION OF INDIGO CARMINE  . CYSTOSCOPY/RETROGRADE/URETEROSCOPY  11/21/2010   Procedure: CYSTOSCOPY/RETROGRADE/URETEROSCOPY;  Surgeon: Molli Hazard, MD;  Location: [facility];  Service: Urology;  Laterality: Left;  . gallstones removed  age 89  . LOOP RECORDER IMPLANT N/A 12/31/2012   Procedure: LOOP RECORDER IMPLANT;  Surgeon: Coralyn Mark, MD;  Location: [place] CATH LAB;  Service: Cardiovascular;  Laterality: N/A;  . PACEMAKER INSERTION  05-20-2013   MDT ADDRL1 pacemaker implanted by Dr Rayann Heman for SSS and symptomatic bradycardia  . PERMANENT PACEMAKER INSERTION Left 05/20/2013   Procedure: PERMANENT PACEMAKER INSERTION;  Surgeon: Coralyn Mark, MD;  Location: [facility] CATH LAB;  Service: Cardiovascular;  Laterality: Left;    Current Outpatient Medications  Medication Sig Dispense Refill  . betamethasone valerate ointment (VALISONE) 0.1 % Apply 1 application topically daily as needed (eczema).     Marland Kitchen ELIQUIS 5  MG TABS tablet TAKE 1 TABLET BY MOUTH TWICE A DAY 60 tablet 6  . furosemide (LASIX) 20 MG tablet Take 1 tablet (20 mg total) by mouth daily. Take 1 tablet at 2 PM every day 30 tablet 11  . levothyroxine (SYNTHROID, LEVOTHROID) 88 MCG tablet Take 88 mcg by mouth every morning.     . metoprolol succinate (TOPROL XL) 50 MG 24 hr tablet Take 1 tablet (50 mg total) by mouth 2 (two) times daily. 60 tablet 11  . potassium chloride (KLOR-CON) 10 MEQ tablet Take 1 tablet (10 mEq total) by mouth daily. 90 tablet 3  . tobramycin (TOBREX) 0.3 % ophthalmic solution Place 1 drop into the right eye See admin instructions. Once monthly, administer 1 drop into right eye four times a day on the day prior to your eye treatment procedure at [facility] and for 1 day after the eye procedure. Repeat this monthly x 4 more treatments.  No current facility-administered medications for this visit.    Allergies:   Acetaminophen and Meclizine   Social History:  The patient  reports that she has never smoked. She has never used smokeless tobacco. She reports that she does not drink alcohol or use drugs.   Family History:  The patient's family history includes Congestive Heart Failure in her father; Heart disease in her father; Stroke in her mother.  ROS:  Please see the history of present illness.    All other systems are reviewed and otherwise negative.   PHYSICAL EXAM:  VS:  There were no vitals taken for this visit. BMI: There is no height or weight on file to calculate BMI. Well nourished, well developed, in no acute distress  HEENT: normocephalic, atraumatic  Neck: no JVD, carotid bruits or masses Cardiac: *** RRR; no significant murmurs, no rubs, or gallops Lungs:  *** CTA b/l, no wheezing, rhonchi or rales  Abd: soft, nontender MS: no deformity, thin, age appropriate atrophy Ext: *** no edema  Skin: warm and dry, no rash Neuro:  No gross deficits appreciated Psych: euthymic mood, full  affect PPM site is stable, no tethering or discomfort   EKG:  Not done today  PPM interrogation done today and reviewed by myself:  ***  10/23/2017: TTE Study Conclusions - Left ventricle: The cavity size was normal. Systolic function was   normal. The estimated ejection fraction was in the range of 50%   to 55%. Wall motion was normal; there were no regional wall   motion abnormalities. - Mitral valve: Mildly thickened leaflets . There was moderate   regurgitation directed posteriorly. - Left atrium: The atrium was mildly dilated. - Right ventricle: Pacer wire or catheter noted in right ventricle. - Tricuspid valve: There was mild regurgitation. - Pericardium, extracardiac: There was a left pleural effusion.   07/15/2012: stress myoview  Impression Exercise Capacity:  Poor exercise capacity. (reached 97.89MPHR, 4.6METS) BP Response:  Hypertensive blood pressure response. Clinical Symptoms:  There is dyspnea. ECG Impression:  No significant ST segment change suggestive of ischemia. Comparison with Prior Nuclear Study: No images to compare  Overall Impression:  Low risk stress nuclear study with a moderate size, moderate intensity, fixed anterior defect consistent with soft tissue attenuation; no ischemia..  LV Ejection Fraction: 77%.  LV Wall Motion:  NL LV Function; NL Wall Motion    Recent Labs: 12/08/2018: Hemoglobin 10.7; Platelets 157 03/16/2019: BUN 16; Creatinine, Ser 1.33; Potassium 4.3; Sodium 136  No results found for requested labs within last 8760 hours.   CrCl cannot be calculated (Unknown ideal weight.).   Wt Readings from Last 3 Encounters:  03/08/19 139 lb (63 kg)  01/20/19 140 lb (63.5 kg)  12/08/18 139 lb (63 kg)     Other studies reviewed: Additional studies/records reviewed today include: summarized above  ASSESSMENT AND PLAN:  1. PPM     ***  Intact function  2. Chronic CHF (diastolic)     ***  3. Permanent AFib     CHA2DS2Vasc is 3,  (age/gender), on Eliquis, appropriately dosed     I***   4. VHD     Mod MR by echo last year         Disposition: ***    Current medicines are reviewed at length with the patient today.  The patient did not have any concerns regarding medicines.  Venetia Night, PA-C 04/04/2019 7:51 PM     [address]  [place] [address] [phone] (office)  [phone] (fax)

## 2019-04-05 DIAGNOSIS — I5020 Unspecified systolic (congestive) heart failure: Secondary | ICD-10-CM | POA: Diagnosis not present

## 2019-04-05 DIAGNOSIS — D649 Anemia, unspecified: Secondary | ICD-10-CM | POA: Diagnosis not present

## 2019-04-05 DIAGNOSIS — T50Z95A Adverse effect of other vaccines and biological substances, initial encounter: Secondary | ICD-10-CM | POA: Diagnosis not present

## 2019-04-05 DIAGNOSIS — R110 Nausea: Secondary | ICD-10-CM | POA: Diagnosis not present

## 2019-04-05 DIAGNOSIS — R0602 Shortness of breath: Secondary | ICD-10-CM | POA: Diagnosis not present

## 2019-04-05 DIAGNOSIS — Z79899 Other long term (current) drug therapy: Secondary | ICD-10-CM | POA: Diagnosis not present

## 2019-04-06 ENCOUNTER — Encounter: Payer: Medicare HMO | Admitting: Physician Assistant

## 2019-04-06 ENCOUNTER — Ambulatory Visit: Payer: Medicare HMO

## 2019-04-16 ENCOUNTER — Encounter: Payer: Self-pay | Admitting: Student

## 2019-04-16 ENCOUNTER — Ambulatory Visit (INDEPENDENT_AMBULATORY_CARE_PROVIDER_SITE_OTHER): Payer: Medicare HMO | Admitting: Student

## 2019-04-16 ENCOUNTER — Other Ambulatory Visit: Payer: Self-pay

## 2019-04-16 VITALS — BP 110/80 | HR 109 | Ht 67.0 in | Wt 140.4 lb

## 2019-04-16 DIAGNOSIS — I480 Paroxysmal atrial fibrillation: Secondary | ICD-10-CM

## 2019-04-16 DIAGNOSIS — R0600 Dyspnea, unspecified: Secondary | ICD-10-CM

## 2019-04-16 DIAGNOSIS — Z79899 Other long term (current) drug therapy: Secondary | ICD-10-CM

## 2019-04-16 DIAGNOSIS — I5032 Chronic diastolic (congestive) heart failure: Secondary | ICD-10-CM | POA: Diagnosis not present

## 2019-04-16 DIAGNOSIS — I495 Sick sinus syndrome: Secondary | ICD-10-CM

## 2019-04-16 LAB — CUP PACEART INCLINIC DEVICE CHECK
Battery Impedance: 330 Ohm
Battery Remaining Longevity: 104 mo
Battery Voltage: 2.79 V
Brady Statistic AP VP Percent: 2 %
Brady Statistic AP VS Percent: 0 %
Brady Statistic AS VP Percent: 2 %
Brady Statistic AS VS Percent: 95 %
Date Time Interrogation Session: 20210402122707
Implantable Lead Implant Date: 20150507
Implantable Lead Implant Date: 20150507
Implantable Lead Location: 753859
Implantable Lead Location: 753860
Implantable Lead Model: 5092
Implantable Lead Model: 5592
Implantable Pulse Generator Implant Date: 20150507
Lead Channel Impedance Value: 483 Ohm
Lead Channel Impedance Value: 576 Ohm
Lead Channel Pacing Threshold Amplitude: 0.625 V
Lead Channel Pacing Threshold Amplitude: 1 V
Lead Channel Pacing Threshold Pulse Width: 0.4 ms
Lead Channel Pacing Threshold Pulse Width: 0.4 ms
Lead Channel Sensing Intrinsic Amplitude: 0.35 mV
Lead Channel Sensing Intrinsic Amplitude: 11.2 mV
Lead Channel Setting Pacing Amplitude: 2 V
Lead Channel Setting Pacing Amplitude: 2.5 V
Lead Channel Setting Pacing Pulse Width: 0.4 ms
Lead Channel Setting Sensing Sensitivity: 4 mV

## 2019-04-16 NOTE — Progress Notes (Signed)
Electrophysiology Office Note Date: 04/16/2019  ID:  Nancy Meadows, DOB 07-23-1932, MRN [id number]  PCP: Merrilee Seashore, MD Primary Cardiologist: No primary care provider on file. Electrophysiologist: Dr. Rayann Heman  CC: Pacemaker follow-up  Nancy Meadows is a 86 y.o. female seen today for Dr. Rayann Heman . she presents today for evaluation of edema.  Since last being seen in our clinic, the patient reports doing about the same. Her and her son feel like she has overall had more fatigue, higher HRs, and more peripheral edema since her first dose of COVID vaccine. They have thus put off getting her second dose so far. She watches her salt and fluid intake.  They have cut back her metoprolol to 50 mg daily, not because she felt poorly, but was more so uncomfortable with the idea of taking 100 mg total. She denies any CP or palpitations. Denies dizziness, lightheadedness, syncope, or near syncope.   Device History: Medtronic Dual Chamber PPM implanted 05/2013 for SSS  Past Medical History:  Diagnosis Date  . Acute head trauma 02/09/2012  . Acute respiratory failure ([place]) 10/22/2017  . Anemia 10/22/2017  . Arthritis    ddd-- occ. pain  . Cholelithiasis   . CKD (chronic kidney disease) stage 3, GFR 30-59 ml/min 03/27/2018  . DIZZINESS 03/07/2010   Qualifier: Diagnosis of  By: Burnett Kanaris    . Dyspnea 03/27/2018  . Eczema   . Goiter   . HLD (hyperlipidemia)   . Hx of cardiovascular stress test    a. ETT-MV 7/14:  Low risk, ant defect c/w soft tissue atten., no ischemia, EF 77%  . HYPERLIPIDEMIA 03/08/2010   Qualifier: Diagnosis of  By: Stanford Breed, MD, Kandyce Rud   . Hypothyroidism 03/08/2010   Qualifier: Diagnosis of  By: Stanford Breed, MD, Kandyce Rud   . Hypoxia   . Lesion of bladder    hematuria/ frequency  . Palpitations 08/17/2012  . Paroxysmal atrial fibrillation ([place]) 01/2016   cahds2vasc sore is 3.  consider anticoagulation if afib burden increases  . Pleural  effusion, bilateral 10/22/2017  . SAH (subarachnoid hemorrhage) ([place]) 02/09/2012  . SDH (subdural hematoma) (HCC)    in the setting of syncope and fall prior to PPM implant, now resolved  . Sick sinus syndrome ([place])   . Subdural hematoma, acute ([place]) 02/09/2012  . Syncope    pauses of >4 seconds   Past Surgical History:  Procedure Laterality Date  . ABDOMINAL HYSTERECTOMY  age 21  . APPENDECTOMY  age 29  . bilateral salpingoophectomy  age 37  . CATARACT EXTRACTION W/ INTRAOCULAR LENS  IMPLANT, BILATERAL  2012  . CYSTOSCOPY WITH BIOPSY  11/21/2010   Procedure: CYSTOSCOPY WITH BIOPSY;  Surgeon: Molli Hazard, MD;  [facility];  Service: Urology;  Laterality: N/A;  CYSTOSCOPY WITH BLADDER BIOPSY  AND INSTILLATION OF INDIGO CARMINE  . CYSTOSCOPY/RETROGRADE/URETEROSCOPY  11/21/2010   Procedure: CYSTOSCOPY/RETROGRADE/URETEROSCOPY;  Surgeon: Molli Hazard, MD;  Location: [facility];  Service: Urology;  Laterality: Left;  . gallstones removed  age 61  . LOOP RECORDER IMPLANT N/A 12/31/2012   Procedure: LOOP RECORDER IMPLANT;  Surgeon: Coralyn Mark, MD;  Location: [place] CATH LAB;  Service: Cardiovascular;  Laterality: N/A;  . PACEMAKER INSERTION  05-20-2013   MDT ADDRL1 pacemaker implanted by Dr Rayann Heman for SSS and symptomatic bradycardia  . PERMANENT PACEMAKER INSERTION Left 05/20/2013   Procedure: PERMANENT PACEMAKER INSERTION;  Surgeon: Coralyn Mark, MD;  Location: [facility] CATH  LAB;  Service: Cardiovascular;  Laterality: Left;    Current Outpatient Medications  Medication Sig Dispense Refill  . betamethasone valerate ointment (VALISONE) 0.1 % Apply 1 application topically daily as needed (eczema).     Marland Kitchen ELIQUIS 5 MG TABS tablet TAKE 1 TABLET BY MOUTH TWICE A DAY 60 tablet 6  . furosemide (LASIX) 20 MG tablet Take 1 tablet (20 mg total) by mouth daily. Take 1 tablet at 2 PM every day 30 tablet 11  . levothyroxine (SYNTHROID, LEVOTHROID) 88 MCG  tablet Take 88 mcg by mouth every morning.     . metoprolol succinate (TOPROL XL) 50 MG 24 hr tablet Take 1 tablet (50 mg total) by mouth 2 (two) times daily. (Patient taking differently: Take 50 mg by mouth daily. ) 60 tablet 11  . potassium chloride (KLOR-CON) 10 MEQ tablet Take 1 tablet (10 mEq total) by mouth daily. (Patient taking differently: Take 20 mEq by mouth daily. ) 90 tablet 3  . tobramycin (TOBREX) 0.3 % ophthalmic solution Place 1 drop into the right eye See admin instructions. Once monthly, administer 1 drop into right eye four times a day on the day prior to your eye treatment procedure at [facility] and for 1 day after the eye procedure. Repeat this monthly x 4 more treatments.     No current facility-administered medications for this visit.    Allergies:   Acetaminophen and Meclizine   Social History: Social History   Socioeconomic History  . Marital status: Widowed    Spouse name: Not on file  . Number of children: Not on file  . Years of education: Not on file  . Highest education level: Not on file  Occupational History  . Not on file  Tobacco Use  . Smoking status: Never Smoker  . Smokeless tobacco: Never Used  . Tobacco comment: tobacco use - no  Substance and Sexual Activity  . Alcohol use: No  . Drug use: No  . Sexual activity: Not on file  Other Topics Concern  . Not on file  Social History Narrative   Married, pt time Control and instrumentation engineer.    Social Determinants of Health   Financial Resource Strain:   . Difficulty of Paying Living Expenses:   Food Insecurity:   . Worried About Charity fundraiser in the Last Year:   . Arboriculturist in the Last Year:   Transportation Needs:   . Film/video editor (Medical):   Marland Kitchen Lack of Transportation (Non-Medical):   Physical Activity:   . Days of Exercise per Week:   . Minutes of Exercise per Session:   Stress:   . Feeling of Stress :   Social Connections:   . Frequency of Communication  with Friends and Family:   . Frequency of Social Gatherings with Friends and Family:   . Attends Religious Services:   . Active Member of Clubs or Organizations:   . Attends Archivist Meetings:   Marland Kitchen Marital Status:   Intimate Partner Violence:   . Fear of Current or Ex-Partner:   . Emotionally Abused:   Marland Kitchen Physically Abused:   . Sexually Abused:     Family History: Family History  Problem Relation Age of Onset  . Congestive Heart Failure Father   . Heart disease Father   . Stroke Mother      Review of Systems: All other systems reviewed and are otherwise negative except as noted above.  Physical Exam: Vitals:  04/16/19 1055  BP: 110/80  Pulse: (!) 109  SpO2: 96%  Weight: 140 lb 6.4 oz (63.7 kg)  Height: _0  (1.702 m)     GEN- The patient is elderly appearing, alert and oriented x 3 today.   HEENT: normocephalic, atraumatic; sclera clear, conjunctiva pink; hearing intact; oropharynx clear; neck supple  Lungs- Clear to ausculation bilaterally, normal work of breathing.  No wheezes, rales, rhonchi Heart- Regular rate and rhythm, no murmurs, rubs or gallops  GI- soft, non-tender, non-distended, bowel sounds present  Extremities- no clubbing, cyanosis, or edema  MS- no significant deformity or atrophy Skin- warm and dry, no rash or lesion; PPM pocket well healed Psych- euthymic mood, full affect Neuro- strength and sensation are intact  PPM Interrogation- reviewed in detail today,  See PACEART report  EKG:  EKG is ordered today. The ekg ordered today shows Atrial fibrillation with RVR at 109 bpm.   Recent Labs: 12/08/2018: Hemoglobin 10.7; Platelets 157 03/16/2019: BUN 16; Creatinine, Ser 1.33; Potassium 4.3; Sodium 136   Wt Readings from Last 3 Encounters:  04/16/19 140 lb 6.4 oz (63.7 kg)  03/08/19 139 lb (63 kg)  01/20/19 140 lb (63.5 kg)     Other studies Reviewed: Additional studies/ records that were reviewed today include: Echo 10/2017 shows  LVEF 50-55%, Previous EP office notes, Previous remote checks, Most recent labwork.   Assessment and Plan:  1. Sick sinus syndrome s/p Medtronic PPM  Normal PPM function See Pace Art report No changes today  2. Chronic diastolic CHF Volume status not elevated today. No ankle edema, trace edema on the top of each foot. JVP not elevated.  Continue current lasix.  Reviewed salt and fluid restriction.  3. Permanent AF Continue Eliquis for CHA2DS2VASC of at least 4   She had better rate control on Toprol 50 mg BID, and I have asked her to go back up to this.   4. Vascular heart disease Mod MR by echo 2019  5. Health Maintenance I have encouraged her to get her second dose of COVID vaccine.  Current medicines are reviewed at length with the patient today.   The patient does not have concerns regarding her medicines.  The following changes were made today:  Instructed to take Metoprolol 50 mg BID as currently instructed  Labs/ tests ordered today include:  Orders Placed This Encounter  Procedures  . Basic Metabolic Panel (BMET)  . CUP PACEART [place]  . EKG 12-Lead     Disposition:   Follow up with me  in 6 weeks to re-assess.   Jacalyn Lefevre, PA-C  04/16/2019 1:00 PM  [address] [place] [address] [phone] (office) [phone] (fax)

## 2019-04-16 NOTE — Patient Instructions (Addendum)
Medication Instructions:  Take your Metoprolol Succinate (TOPROL) 50 mg TWICE DAILY   *If you need a refill on your cardiac medications before your next appointment, please call your pharmacy*   Lab Work:  TODAY BMET If you have labs (blood work) drawn today and your tests are completely normal, you will receive your results only by: Marland Kitchen MyChart Message (if you have MyChart) OR . A paper copy in the mail If you have any lab test that is abnormal or we need to change your treatment, we will call you to review the results.   Testing/Procedures: none   Follow-Up: At [facility], you and your health needs are our priority.  As part of our continuing mission to provide you with exceptional heart care, we have created designated Provider Care Teams.  These Care Teams include your primary Cardiologist (physician) and Advanced Practice Providers (APPs -  Physician Assistants and Nurse Practitioners) who all work together to provide you with the care you need, when you need it.  We recommend signing up for the patient portal called "MyChart".  Sign up information is provided on this After Visit Summary.  MyChart is used to connect with patients for Virtual Visits (Telemedicine).  Patients are able to view lab/test results, encounter notes, upcoming appointments, etc.  Non-urgent messages can be sent to your provider as well.   To learn more about what you can do with MyChart, go to [email or web address].    Your next appointment:   6 weeks  The format for your next appointment:   In Person  Provider:   Oda Kilts, PA   Other Instructions Remote monitoring is used to monitor your Pacemaker from home. This monitoring reduces the number of office visits required to check your device to one time per year. It allows [place] to keep an eye on the functioning of your device to ensure it is working properly. You are scheduled for a device check from home on 06/23/19. You may send your transmission  at any time that day. If you have a wireless device, the transmission will be sent automatically. After your physician reviews your transmission, you will receive a postcard with your next transmission date.

## 2019-04-17 LAB — BASIC METABOLIC PANEL
BUN/Creatinine Ratio: 12 (ref 12–28)
BUN: 14 mg/dL (ref 8–27)
CO2: 22 mmol/L (ref 20–29)
Calcium: 9 mg/dL (ref 8.7–10.3)
Chloride: 105 mmol/L (ref 96–106)
Creatinine, Ser: 1.15 mg/dL — ABNORMAL HIGH (ref 0.57–1.00)
GFR calc Af Amer: 50 mL/min/{1.73_m2} — ABNORMAL LOW (ref >59)
GFR calc non Af Amer: 44 mL/min/{1.73_m2} — ABNORMAL LOW (ref >59)
Glucose: 108 mg/dL — ABNORMAL HIGH (ref 65–99)
Potassium: 4.3 mmol/L (ref 3.5–5.2)
Sodium: 139 mmol/L (ref 134–144)

## 2019-04-20 ENCOUNTER — Encounter: Payer: Medicare HMO | Admitting: Physician Assistant

## 2019-04-20 DIAGNOSIS — E876 Hypokalemia: Secondary | ICD-10-CM | POA: Diagnosis not present

## 2019-04-20 DIAGNOSIS — I5020 Unspecified systolic (congestive) heart failure: Secondary | ICD-10-CM | POA: Diagnosis not present

## 2019-04-20 DIAGNOSIS — I495 Sick sinus syndrome: Secondary | ICD-10-CM | POA: Diagnosis not present

## 2019-04-20 DIAGNOSIS — D649 Anemia, unspecified: Secondary | ICD-10-CM | POA: Diagnosis not present

## 2019-04-20 DIAGNOSIS — R0602 Shortness of breath: Secondary | ICD-10-CM | POA: Diagnosis not present

## 2019-04-20 DIAGNOSIS — E039 Hypothyroidism, unspecified: Secondary | ICD-10-CM | POA: Diagnosis not present

## 2019-04-20 DIAGNOSIS — R5383 Other fatigue: Secondary | ICD-10-CM | POA: Diagnosis not present

## 2019-04-20 DIAGNOSIS — I4821 Permanent atrial fibrillation: Secondary | ICD-10-CM | POA: Diagnosis not present

## 2019-04-22 DIAGNOSIS — N183 Chronic kidney disease, stage 3 unspecified: Secondary | ICD-10-CM | POA: Diagnosis not present

## 2019-04-22 DIAGNOSIS — J810 Acute pulmonary edema: Secondary | ICD-10-CM | POA: Diagnosis not present

## 2019-04-22 DIAGNOSIS — E785 Hyperlipidemia, unspecified: Secondary | ICD-10-CM | POA: Diagnosis not present

## 2019-04-22 DIAGNOSIS — R0609 Other forms of dyspnea: Secondary | ICD-10-CM | POA: Diagnosis not present

## 2019-04-22 DIAGNOSIS — Z20822 Contact with and (suspected) exposure to covid-19: Secondary | ICD-10-CM | POA: Diagnosis not present

## 2019-04-22 DIAGNOSIS — R319 Hematuria, unspecified: Secondary | ICD-10-CM | POA: Diagnosis not present

## 2019-04-22 DIAGNOSIS — I480 Paroxysmal atrial fibrillation: Secondary | ICD-10-CM | POA: Diagnosis not present

## 2019-04-22 DIAGNOSIS — R001 Bradycardia, unspecified: Secondary | ICD-10-CM | POA: Diagnosis not present

## 2019-04-22 DIAGNOSIS — I4811 Longstanding persistent atrial fibrillation: Secondary | ICD-10-CM | POA: Diagnosis not present

## 2019-04-22 DIAGNOSIS — R2681 Unsteadiness on feet: Secondary | ICD-10-CM | POA: Diagnosis not present

## 2019-04-22 DIAGNOSIS — Z7901 Long term (current) use of anticoagulants: Secondary | ICD-10-CM | POA: Diagnosis not present

## 2019-04-22 DIAGNOSIS — R000 Tachycardia, unspecified: Secondary | ICD-10-CM | POA: Diagnosis not present

## 2019-04-22 DIAGNOSIS — I4891 Unspecified atrial fibrillation: Secondary | ICD-10-CM | POA: Diagnosis not present

## 2019-04-22 DIAGNOSIS — Z950 Presence of cardiac pacemaker: Secondary | ICD-10-CM | POA: Diagnosis not present

## 2019-04-22 DIAGNOSIS — I495 Sick sinus syndrome: Secondary | ICD-10-CM | POA: Diagnosis not present

## 2019-04-22 DIAGNOSIS — R5383 Other fatigue: Secondary | ICD-10-CM | POA: Diagnosis not present

## 2019-04-22 DIAGNOSIS — I081 Rheumatic disorders of both mitral and tricuspid valves: Secondary | ICD-10-CM | POA: Diagnosis not present

## 2019-04-22 DIAGNOSIS — E039 Hypothyroidism, unspecified: Secondary | ICD-10-CM | POA: Diagnosis not present

## 2019-04-22 DIAGNOSIS — Z8679 Personal history of other diseases of the circulatory system: Secondary | ICD-10-CM | POA: Diagnosis not present

## 2019-04-22 DIAGNOSIS — I499 Cardiac arrhythmia, unspecified: Secondary | ICD-10-CM | POA: Diagnosis not present

## 2019-04-22 DIAGNOSIS — R0602 Shortness of breath: Secondary | ICD-10-CM | POA: Diagnosis not present

## 2019-04-22 DIAGNOSIS — I959 Hypotension, unspecified: Secondary | ICD-10-CM | POA: Diagnosis not present

## 2019-04-22 DIAGNOSIS — I5032 Chronic diastolic (congestive) heart failure: Secondary | ICD-10-CM | POA: Diagnosis not present

## 2019-04-23 DIAGNOSIS — I5030 Unspecified diastolic (congestive) heart failure: Secondary | ICD-10-CM | POA: Diagnosis not present

## 2019-04-23 DIAGNOSIS — I083 Combined rheumatic disorders of mitral, aortic and tricuspid valves: Secondary | ICD-10-CM | POA: Diagnosis not present

## 2019-04-23 DIAGNOSIS — E039 Hypothyroidism, unspecified: Secondary | ICD-10-CM | POA: Diagnosis not present

## 2019-04-23 DIAGNOSIS — I4891 Unspecified atrial fibrillation: Secondary | ICD-10-CM | POA: Diagnosis not present

## 2019-04-23 DIAGNOSIS — Z950 Presence of cardiac pacemaker: Secondary | ICD-10-CM | POA: Diagnosis not present

## 2019-04-23 DIAGNOSIS — N183 Chronic kidney disease, stage 3 unspecified: Secondary | ICD-10-CM | POA: Diagnosis not present

## 2019-04-23 DIAGNOSIS — Z7901 Long term (current) use of anticoagulants: Secondary | ICD-10-CM | POA: Diagnosis not present

## 2019-04-23 DIAGNOSIS — I499 Cardiac arrhythmia, unspecified: Secondary | ICD-10-CM | POA: Diagnosis not present

## 2019-04-23 DIAGNOSIS — I495 Sick sinus syndrome: Secondary | ICD-10-CM | POA: Diagnosis not present

## 2019-04-23 DIAGNOSIS — I4821 Permanent atrial fibrillation: Secondary | ICD-10-CM | POA: Diagnosis not present

## 2019-04-23 DIAGNOSIS — I5032 Chronic diastolic (congestive) heart failure: Secondary | ICD-10-CM | POA: Diagnosis not present

## 2019-04-24 DIAGNOSIS — I4891 Unspecified atrial fibrillation: Secondary | ICD-10-CM | POA: Diagnosis not present

## 2019-04-24 DIAGNOSIS — I495 Sick sinus syndrome: Secondary | ICD-10-CM | POA: Diagnosis not present

## 2019-04-24 DIAGNOSIS — I083 Combined rheumatic disorders of mitral, aortic and tricuspid valves: Secondary | ICD-10-CM | POA: Diagnosis not present

## 2019-04-24 DIAGNOSIS — E785 Hyperlipidemia, unspecified: Secondary | ICD-10-CM | POA: Diagnosis not present

## 2019-04-24 DIAGNOSIS — Z7901 Long term (current) use of anticoagulants: Secondary | ICD-10-CM | POA: Diagnosis not present

## 2019-04-24 DIAGNOSIS — E039 Hypothyroidism, unspecified: Secondary | ICD-10-CM | POA: Diagnosis not present

## 2019-04-24 DIAGNOSIS — I5030 Unspecified diastolic (congestive) heart failure: Secondary | ICD-10-CM | POA: Diagnosis not present

## 2019-04-24 DIAGNOSIS — I5032 Chronic diastolic (congestive) heart failure: Secondary | ICD-10-CM | POA: Diagnosis not present

## 2019-04-24 DIAGNOSIS — N183 Chronic kidney disease, stage 3 unspecified: Secondary | ICD-10-CM | POA: Diagnosis not present

## 2019-04-24 DIAGNOSIS — I4821 Permanent atrial fibrillation: Secondary | ICD-10-CM | POA: Diagnosis not present

## 2019-04-24 DIAGNOSIS — I499 Cardiac arrhythmia, unspecified: Secondary | ICD-10-CM | POA: Diagnosis not present

## 2019-04-24 DIAGNOSIS — Z950 Presence of cardiac pacemaker: Secondary | ICD-10-CM | POA: Diagnosis not present

## 2019-04-25 DIAGNOSIS — Z7901 Long term (current) use of anticoagulants: Secondary | ICD-10-CM | POA: Diagnosis not present

## 2019-04-25 DIAGNOSIS — I5032 Chronic diastolic (congestive) heart failure: Secondary | ICD-10-CM | POA: Diagnosis not present

## 2019-04-25 DIAGNOSIS — N183 Chronic kidney disease, stage 3 unspecified: Secondary | ICD-10-CM | POA: Diagnosis not present

## 2019-04-25 DIAGNOSIS — I499 Cardiac arrhythmia, unspecified: Secondary | ICD-10-CM | POA: Diagnosis not present

## 2019-04-25 DIAGNOSIS — E039 Hypothyroidism, unspecified: Secondary | ICD-10-CM | POA: Diagnosis not present

## 2019-04-25 DIAGNOSIS — I4891 Unspecified atrial fibrillation: Secondary | ICD-10-CM | POA: Diagnosis not present

## 2019-04-25 DIAGNOSIS — Z950 Presence of cardiac pacemaker: Secondary | ICD-10-CM | POA: Diagnosis not present

## 2019-04-25 DIAGNOSIS — I495 Sick sinus syndrome: Secondary | ICD-10-CM | POA: Diagnosis not present

## 2019-04-26 DIAGNOSIS — I495 Sick sinus syndrome: Secondary | ICD-10-CM | POA: Diagnosis not present

## 2019-04-26 DIAGNOSIS — I313 Pericardial effusion (noninflammatory): Secondary | ICD-10-CM | POA: Diagnosis not present

## 2019-04-26 DIAGNOSIS — E039 Hypothyroidism, unspecified: Secondary | ICD-10-CM | POA: Diagnosis not present

## 2019-04-26 DIAGNOSIS — Z7901 Long term (current) use of anticoagulants: Secondary | ICD-10-CM | POA: Diagnosis not present

## 2019-04-26 DIAGNOSIS — Z950 Presence of cardiac pacemaker: Secondary | ICD-10-CM | POA: Diagnosis not present

## 2019-04-26 DIAGNOSIS — I4891 Unspecified atrial fibrillation: Secondary | ICD-10-CM | POA: Diagnosis not present

## 2019-04-26 DIAGNOSIS — I340 Nonrheumatic mitral (valve) insufficiency: Secondary | ICD-10-CM | POA: Diagnosis not present

## 2019-04-26 DIAGNOSIS — I517 Cardiomegaly: Secondary | ICD-10-CM | POA: Diagnosis not present

## 2019-04-26 DIAGNOSIS — I5032 Chronic diastolic (congestive) heart failure: Secondary | ICD-10-CM | POA: Diagnosis not present

## 2019-04-26 DIAGNOSIS — I348 Other nonrheumatic mitral valve disorders: Secondary | ICD-10-CM | POA: Diagnosis not present

## 2019-04-26 DIAGNOSIS — I361 Nonrheumatic tricuspid (valve) insufficiency: Secondary | ICD-10-CM | POA: Diagnosis not present

## 2019-04-26 DIAGNOSIS — I499 Cardiac arrhythmia, unspecified: Secondary | ICD-10-CM | POA: Diagnosis not present

## 2019-04-26 DIAGNOSIS — N183 Chronic kidney disease, stage 3 unspecified: Secondary | ICD-10-CM | POA: Diagnosis not present

## 2019-04-27 DIAGNOSIS — I5032 Chronic diastolic (congestive) heart failure: Secondary | ICD-10-CM | POA: Diagnosis not present

## 2019-04-27 DIAGNOSIS — I340 Nonrheumatic mitral (valve) insufficiency: Secondary | ICD-10-CM | POA: Diagnosis not present

## 2019-04-27 DIAGNOSIS — R0902 Hypoxemia: Secondary | ICD-10-CM | POA: Diagnosis not present

## 2019-04-27 DIAGNOSIS — I4891 Unspecified atrial fibrillation: Secondary | ICD-10-CM | POA: Diagnosis not present

## 2019-04-27 DIAGNOSIS — R5383 Other fatigue: Secondary | ICD-10-CM | POA: Diagnosis not present

## 2019-04-29 DIAGNOSIS — N183 Chronic kidney disease, stage 3 unspecified: Secondary | ICD-10-CM | POA: Diagnosis not present

## 2019-04-29 DIAGNOSIS — N189 Chronic kidney disease, unspecified: Secondary | ICD-10-CM | POA: Diagnosis not present

## 2019-04-29 DIAGNOSIS — R630 Anorexia: Secondary | ICD-10-CM | POA: Diagnosis not present

## 2019-04-29 DIAGNOSIS — I4891 Unspecified atrial fibrillation: Secondary | ICD-10-CM | POA: Diagnosis not present

## 2019-04-29 DIAGNOSIS — R69 Illness, unspecified: Secondary | ICD-10-CM | POA: Diagnosis not present

## 2019-04-29 DIAGNOSIS — Z79899 Other long term (current) drug therapy: Secondary | ICD-10-CM | POA: Diagnosis not present

## 2019-04-29 DIAGNOSIS — I340 Nonrheumatic mitral (valve) insufficiency: Secondary | ICD-10-CM | POA: Diagnosis not present

## 2019-04-29 DIAGNOSIS — D631 Anemia in chronic kidney disease: Secondary | ICD-10-CM | POA: Diagnosis not present

## 2019-05-04 DIAGNOSIS — I340 Nonrheumatic mitral (valve) insufficiency: Secondary | ICD-10-CM | POA: Diagnosis not present

## 2019-05-04 DIAGNOSIS — Z01812 Encounter for preprocedural laboratory examination: Secondary | ICD-10-CM | POA: Diagnosis not present

## 2019-05-04 DIAGNOSIS — Z20822 Contact with and (suspected) exposure to covid-19: Secondary | ICD-10-CM | POA: Diagnosis not present

## 2019-05-05 DIAGNOSIS — H35362 Drusen (degenerative) of macula, left eye: Secondary | ICD-10-CM | POA: Diagnosis not present

## 2019-05-05 DIAGNOSIS — H34831 Tributary (branch) retinal vein occlusion, right eye, with macular edema: Secondary | ICD-10-CM | POA: Diagnosis not present

## 2019-05-05 DIAGNOSIS — H3540 Unspecified peripheral retinal degeneration: Secondary | ICD-10-CM | POA: Diagnosis not present

## 2019-05-05 DIAGNOSIS — H43813 Vitreous degeneration, bilateral: Secondary | ICD-10-CM | POA: Diagnosis not present

## 2019-05-11 DIAGNOSIS — E039 Hypothyroidism, unspecified: Secondary | ICD-10-CM | POA: Diagnosis not present

## 2019-05-11 DIAGNOSIS — I340 Nonrheumatic mitral (valve) insufficiency: Secondary | ICD-10-CM | POA: Diagnosis not present

## 2019-05-11 DIAGNOSIS — I272 Pulmonary hypertension, unspecified: Secondary | ICD-10-CM | POA: Diagnosis not present

## 2019-05-11 DIAGNOSIS — N183 Chronic kidney disease, stage 3 unspecified: Secondary | ICD-10-CM | POA: Diagnosis not present

## 2019-05-11 DIAGNOSIS — I4891 Unspecified atrial fibrillation: Secondary | ICD-10-CM | POA: Diagnosis not present

## 2019-05-11 DIAGNOSIS — I5030 Unspecified diastolic (congestive) heart failure: Secondary | ICD-10-CM | POA: Diagnosis not present

## 2019-05-11 DIAGNOSIS — I495 Sick sinus syndrome: Secondary | ICD-10-CM | POA: Diagnosis not present

## 2019-05-11 DIAGNOSIS — Z7901 Long term (current) use of anticoagulants: Secondary | ICD-10-CM | POA: Diagnosis not present

## 2019-05-11 DIAGNOSIS — E785 Hyperlipidemia, unspecified: Secondary | ICD-10-CM | POA: Diagnosis not present

## 2019-05-12 DIAGNOSIS — J984 Other disorders of lung: Secondary | ICD-10-CM | POA: Diagnosis not present

## 2019-05-14 DIAGNOSIS — I5032 Chronic diastolic (congestive) heart failure: Secondary | ICD-10-CM | POA: Diagnosis not present

## 2019-05-14 DIAGNOSIS — I2723 Pulmonary hypertension due to lung diseases and hypoxia: Secondary | ICD-10-CM | POA: Diagnosis not present

## 2019-05-14 DIAGNOSIS — I482 Chronic atrial fibrillation, unspecified: Secondary | ICD-10-CM | POA: Diagnosis not present

## 2019-05-14 DIAGNOSIS — Z66 Do not resuscitate: Secondary | ICD-10-CM | POA: Diagnosis not present

## 2019-05-14 DIAGNOSIS — E785 Hyperlipidemia, unspecified: Secondary | ICD-10-CM | POA: Diagnosis not present

## 2019-05-14 DIAGNOSIS — J189 Pneumonia, unspecified organism: Secondary | ICD-10-CM | POA: Diagnosis not present

## 2019-05-14 DIAGNOSIS — D696 Thrombocytopenia, unspecified: Secondary | ICD-10-CM | POA: Diagnosis not present

## 2019-05-14 DIAGNOSIS — J159 Unspecified bacterial pneumonia: Secondary | ICD-10-CM | POA: Diagnosis not present

## 2019-05-14 DIAGNOSIS — R6521 Severe sepsis with septic shock: Secondary | ICD-10-CM | POA: Diagnosis not present

## 2019-05-14 DIAGNOSIS — I340 Nonrheumatic mitral (valve) insufficiency: Secondary | ICD-10-CM | POA: Diagnosis not present

## 2019-05-14 DIAGNOSIS — Z20822 Contact with and (suspected) exposure to covid-19: Secondary | ICD-10-CM | POA: Diagnosis not present

## 2019-05-14 DIAGNOSIS — R69 Illness, unspecified: Secondary | ICD-10-CM | POA: Diagnosis not present

## 2019-05-14 DIAGNOSIS — N170 Acute kidney failure with tubular necrosis: Secondary | ICD-10-CM | POA: Diagnosis not present

## 2019-05-14 DIAGNOSIS — E039 Hypothyroidism, unspecified: Secondary | ICD-10-CM | POA: Diagnosis not present

## 2019-05-14 DIAGNOSIS — I081 Rheumatic disorders of both mitral and tricuspid valves: Secondary | ICD-10-CM | POA: Diagnosis not present

## 2019-05-14 DIAGNOSIS — I5030 Unspecified diastolic (congestive) heart failure: Secondary | ICD-10-CM | POA: Diagnosis not present

## 2019-05-14 DIAGNOSIS — N1831 Chronic kidney disease, stage 3a: Secondary | ICD-10-CM | POA: Diagnosis not present

## 2019-05-14 DIAGNOSIS — I4811 Longstanding persistent atrial fibrillation: Secondary | ICD-10-CM | POA: Diagnosis not present

## 2019-05-14 DIAGNOSIS — D649 Anemia, unspecified: Secondary | ICD-10-CM | POA: Diagnosis not present

## 2019-05-14 DIAGNOSIS — R5383 Other fatigue: Secondary | ICD-10-CM | POA: Diagnosis not present

## 2019-05-14 DIAGNOSIS — J9601 Acute respiratory failure with hypoxia: Secondary | ICD-10-CM | POA: Diagnosis not present

## 2019-05-14 DIAGNOSIS — R0600 Dyspnea, unspecified: Secondary | ICD-10-CM | POA: Diagnosis not present

## 2019-05-14 DIAGNOSIS — J181 Lobar pneumonia, unspecified organism: Secondary | ICD-10-CM | POA: Diagnosis not present

## 2019-05-14 DIAGNOSIS — N183 Chronic kidney disease, stage 3 unspecified: Secondary | ICD-10-CM | POA: Diagnosis not present

## 2019-05-14 DIAGNOSIS — G9341 Metabolic encephalopathy: Secondary | ICD-10-CM | POA: Diagnosis not present

## 2019-05-14 DIAGNOSIS — A419 Sepsis, unspecified organism: Secondary | ICD-10-CM | POA: Diagnosis not present

## 2019-05-14 DIAGNOSIS — I130 Hypertensive heart and chronic kidney disease with heart failure and stage 1 through stage 4 chronic kidney disease, or unspecified chronic kidney disease: Secondary | ICD-10-CM | POA: Diagnosis not present

## 2019-05-14 DIAGNOSIS — I4891 Unspecified atrial fibrillation: Secondary | ICD-10-CM | POA: Diagnosis not present

## 2019-05-14 DIAGNOSIS — N179 Acute kidney failure, unspecified: Secondary | ICD-10-CM | POA: Diagnosis not present

## 2019-05-14 DIAGNOSIS — R509 Fever, unspecified: Secondary | ICD-10-CM | POA: Diagnosis not present

## 2019-05-14 DIAGNOSIS — Z7901 Long term (current) use of anticoagulants: Secondary | ICD-10-CM | POA: Diagnosis not present

## 2019-05-14 DIAGNOSIS — I444 Left anterior fascicular block: Secondary | ICD-10-CM | POA: Diagnosis not present

## 2019-05-14 DIAGNOSIS — I272 Pulmonary hypertension, unspecified: Secondary | ICD-10-CM | POA: Diagnosis not present

## 2019-05-14 DIAGNOSIS — I480 Paroxysmal atrial fibrillation: Secondary | ICD-10-CM | POA: Diagnosis not present

## 2019-05-22 DIAGNOSIS — D649 Anemia, unspecified: Secondary | ICD-10-CM | POA: Diagnosis not present

## 2019-05-24 DIAGNOSIS — N183 Chronic kidney disease, stage 3 unspecified: Secondary | ICD-10-CM | POA: Diagnosis not present

## 2019-05-24 DIAGNOSIS — R5381 Other malaise: Secondary | ICD-10-CM | POA: Diagnosis not present

## 2019-05-24 DIAGNOSIS — I340 Nonrheumatic mitral (valve) insufficiency: Secondary | ICD-10-CM | POA: Diagnosis not present

## 2019-05-24 DIAGNOSIS — J189 Pneumonia, unspecified organism: Secondary | ICD-10-CM | POA: Diagnosis not present

## 2019-05-24 DIAGNOSIS — Z950 Presence of cardiac pacemaker: Secondary | ICD-10-CM | POA: Diagnosis not present

## 2019-05-24 DIAGNOSIS — I130 Hypertensive heart and chronic kidney disease with heart failure and stage 1 through stage 4 chronic kidney disease, or unspecified chronic kidney disease: Secondary | ICD-10-CM | POA: Diagnosis not present

## 2019-05-24 DIAGNOSIS — I4891 Unspecified atrial fibrillation: Secondary | ICD-10-CM | POA: Diagnosis not present

## 2019-05-24 DIAGNOSIS — I4892 Unspecified atrial flutter: Secondary | ICD-10-CM | POA: Diagnosis not present

## 2019-05-24 DIAGNOSIS — I5030 Unspecified diastolic (congestive) heart failure: Secondary | ICD-10-CM | POA: Diagnosis not present

## 2019-05-24 DIAGNOSIS — J9601 Acute respiratory failure with hypoxia: Secondary | ICD-10-CM | POA: Diagnosis not present

## 2019-05-24 DIAGNOSIS — Z7901 Long term (current) use of anticoagulants: Secondary | ICD-10-CM | POA: Diagnosis not present

## 2019-05-30 NOTE — Progress Notes (Deleted)
Electrophysiology Office Note Date: 05/30/2019  ID:  Nancy Meadows, DOB Jan 15, 1932, MRN [id number]  PCP: Merrilee Seashore, MD Primary Cardiologist: No primary care provider on file. Electrophysiologist: Dr. Rayann Heman  CC: Pacemaker follow-up  Nancy Meadows is a 86 y.o. female seen today for Dr. Rayann Heman for routine electrophysiology followup.  Since last being seen in our clinic the patient reports doing ***. She was recently admitted to *** with sepsis in the setting of PNA.  she denies chest pain, palpitations, dyspnea, PND, orthopnea, nausea, vomiting, dizziness, syncope, edema, weight gain, or early satiety.  Device History: Medtronic Dual Chamber PPM implanted 05/2013 for SSS  Past Medical History:  Diagnosis Date  . Acute head trauma 02/09/2012  . Acute respiratory failure ([place]) 10/22/2017  . Anemia 10/22/2017  . Arthritis    ddd-- occ. pain  . Cholelithiasis   . CKD (chronic kidney disease) stage 3, GFR 30-59 ml/min 03/27/2018  . DIZZINESS 03/07/2010   Qualifier: Diagnosis of  By: Burnett Kanaris    . Dyspnea 03/27/2018  . Eczema   . Goiter   . HLD (hyperlipidemia)   . Hx of cardiovascular stress test    a. ETT-MV 7/14:  Low risk, ant defect c/w soft tissue atten., no ischemia, EF 77%  . HYPERLIPIDEMIA 03/08/2010   Qualifier: Diagnosis of  By: Stanford Breed, MD, Kandyce Rud   . Hypothyroidism 03/08/2010   Qualifier: Diagnosis of  By: Stanford Breed, MD, Kandyce Rud   . Hypoxia   . Lesion of bladder    hematuria/ frequency  . Palpitations 08/17/2012  . Paroxysmal atrial fibrillation ([place]) 01/2016   cahds2vasc sore is 3.  consider anticoagulation if afib burden increases  . Pleural effusion, bilateral 10/22/2017  . SAH (subarachnoid hemorrhage) ([place]) 02/09/2012  . SDH (subdural hematoma) ([facility])    in the setting of syncope and fall prior to PPM implant, now resolved  . Sick sinus syndrome ([place])   . Subdural hematoma, acute ([place]) 02/09/2012  . Syncope    pauses of  >4 seconds   Past Surgical History:  Procedure Laterality Date  . ABDOMINAL HYSTERECTOMY  age 89  . APPENDECTOMY  age 53  . bilateral salpingoophectomy  age 69  . CATARACT EXTRACTION W/ INTRAOCULAR LENS  IMPLANT, BILATERAL  2012  . CYSTOSCOPY WITH BIOPSY  11/21/2010   Procedure: CYSTOSCOPY WITH BIOPSY;  Surgeon: Molli Hazard, MD;  Location[facility];  Service: Urology;  Laterality: N/A;  CYSTOSCOPY WITH BLADDER BIOPSY  AND INSTILLATION OF INDIGO CARMINE  . CYSTOSCOPY/RETROGRADE/URETEROSCOPY  11/21/2010   Procedure: CYSTOSCOPY/RETROGRADE/URETEROSCOPY;  Surgeon: Molli Hazard, MD;  Location: [facility];  Service: Urology;  Laterality: Left;  . gallstones removed  age 53  . LOOP RECORDER IMPLANT N/A 12/31/2012   Procedure: LOOP RECORDER IMPLANT;  Surgeon: Coralyn Mark, MD;  Location: [place] C[facility];  Service: Cardiovascular;  Laterality: N/A;  . PACEMAKER INSERTION  05-20-2013   MDT ADDRL1 pacemaker implanted by Dr Rayann Heman for SSS and symptomatic bradycardia  . PERMANENT PACEMAKER INSERTION Left 05/20/2013   Procedure: PERMANENT PACEMAKER INSERTION;  Surgeon: Coralyn Mark, MD;  Location: [place] CATH [facility];  Service: Cardiovascular;  Laterality: Left;    Current Outpatient Medications  Medication Sig Dispense Refill  . betamethasone valerate ointment (VALISONE) 0.1 % Apply 1 application topically daily as needed (eczema).     Marland Kitchen ELIQUIS 5 MG TABS tablet TAKE 1 TABLET BY MOUTH TWICE A DAY 60 tablet 6  . furosemide (LASIX) 20  MG tablet Take 1 tablet (20 mg total) by mouth daily. Take 1 tablet at 2 PM every day 30 tablet 11  . levothyroxine (SYNTHROID, LEVOTHROID) 88 MCG tablet Take 88 mcg by mouth every morning.     . metoprolol succinate (TOPROL XL) 50 MG 24 hr tablet Take 1 tablet (50 mg total) by mouth 2 (two) times daily. (Patient taking differently: Take 50 mg by mouth daily. ) 60 tablet 11  . potassium chloride (KLOR-CON) 10 MEQ tablet Take 1 tablet  (10 mEq total) by mouth daily. (Patient taking differently: Take 20 mEq by mouth daily. ) 90 tablet 3  . tobramycin (TOBREX) 0.3 % ophthalmic solution Place 1 drop into the right eye See admin instructions. Once monthly, administer 1 drop into right eye four times a day on the day prior to your eye treatment procedure at [facility] and for 1 day after the eye procedure. Repeat this monthly x 4 more treatments.     No current facility-administered medications for this visit.    Allergies:   Acetaminophen and Meclizine   Social History: Social History   Socioeconomic History  . Marital status: Widowed    Spouse name: Not on file  . Number of children: Not on file  . Years of education: Not on file  . Highest education level: Not on file  Occupational History  . Not on file  Tobacco Use  . Smoking status: Never Smoker  . Smokeless tobacco: Never Used  . Tobacco comment: tobacco use - no  Substance and Sexual Activity  . Alcohol use: No  . Drug use: No  . Sexual activity: Not on file  Other Topics Concern  . Not on file  Social History Narrative   Married, pt time Control and instrumentation engineer.    Social Determinants of Health   Financial Resource Strain:   . Difficulty of Paying Living Expenses:   Food Insecurity:   . Worried About Charity fundraiser in the Last Year:   . Arboriculturist in the Last Year:   Transportation Needs:   . Film/video editor (Medical):   Marland Kitchen Lack of Transportation (Non-Medical):   Physical Activity:   . Days of Exercise per Week:   . Minutes of Exercise per Session:   Stress:   . Feeling of Stress :   Social Connections:   . Frequency of Communication with Friends and Family:   . Frequency of Social Gatherings with Friends and Family:   . Attends Religious Services:   . Active Member of Clubs or Organizations:   . Attends Archivist Meetings:   Marland Kitchen Marital Status:   Intimate Partner Violence:   . Fear of Current or Ex-Partner:    . Emotionally Abused:   Marland Kitchen Physically Abused:   . Sexually Abused:     Family History: Family History  Problem Relation Age of Onset  . Congestive Heart Failure Father   . Heart disease Father   . Stroke Mother      Review of Systems: All other systems reviewed and are otherwise negative except as noted above.  Physical Exam: There were no vitals filed for this visit.   GEN- The patient is well appearing, alert and oriented x 3 today.   HEENT: normocephalic, atraumatic; sclera clear, conjunctiva pink; hearing intact; oropharynx clear; neck supple  Lungs- Clear to ausculation bilaterally, normal work of breathing.  No wheezes, rales, rhonchi Heart- Regular rate and rhythm, no murmurs, rubs or gallops  GI- soft, non-tender, non-distended, bowel sounds present  Extremities- no clubbing, cyanosis, or edema  MS- no significant deformity or atrophy Skin- warm and dry, no rash or lesion; PPM pocket well healed Psych- euthymic mood, full affect Neuro- strength and sensation are intact  PPM Interrogation- reviewed in detail today,  See PACEART report  EKG:  EKG is not ordered today. The ekg ordered today shows ***  Recent Labs: 12/08/2018: Hemoglobin 10.7; Platelets 157 04/16/2019: BUN 14; Creatinine, Ser 1.15; Potassium 4.3; Sodium 139   Wt Readings from Last 3 Encounters:  04/16/19 140 lb 6.4 oz (63.7 kg)  03/08/19 139 lb (63 kg)  01/20/19 140 lb (63.5 kg)     Other studies Reviewed: Additional studies/ records that were reviewed today include: Previous EP office notes, Previous remote checks, Most recent labwork.   Assessment and Plan:  1. Sick sinus syndrome s/p Medtronic PPM  Normal PPM function See Pace Art report No changes today  2. Chronic diastolic CHF Volume status *** Continue current lasix  3. Permanent AF Continue eliquis for CHA2DS2VASC of at least 4    4. Vascular heart disease  Mod MR by echo 2019  Current medicines are reviewed at length with  the patient today.   The patient does not have concerns regarding her medicines.  The following changes were made today:  {NONE DEFAULTED:18576::"none"}  Labs/ tests ordered today include: *** No orders of the defined types were placed in this encounter.    Disposition:   Follow up with {Blank single:19197::"Dr. Allred","Dr. Arlan Organ. Klein","Dr. Camnitz","EP APP"} in *** {Blank single:19197::"Months","Weeks"}    Signed, Shirley Friar, PA-C  05/30/2019 11:48 AM  [facility] [address] [place]  [address] [phone] (office) [phone] (fax)

## 2019-05-31 ENCOUNTER — Encounter: Payer: Medicare HMO | Admitting: Student

## 2019-05-31 DIAGNOSIS — I5030 Unspecified diastolic (congestive) heart failure: Secondary | ICD-10-CM | POA: Diagnosis not present

## 2019-05-31 DIAGNOSIS — J189 Pneumonia, unspecified organism: Secondary | ICD-10-CM | POA: Diagnosis not present

## 2019-05-31 DIAGNOSIS — R5381 Other malaise: Secondary | ICD-10-CM | POA: Diagnosis not present

## 2019-05-31 DIAGNOSIS — J9601 Acute respiratory failure with hypoxia: Secondary | ICD-10-CM | POA: Diagnosis not present

## 2019-06-01 DIAGNOSIS — R5381 Other malaise: Secondary | ICD-10-CM | POA: Diagnosis not present

## 2019-06-01 DIAGNOSIS — J9601 Acute respiratory failure with hypoxia: Secondary | ICD-10-CM | POA: Diagnosis not present

## 2019-06-01 DIAGNOSIS — J189 Pneumonia, unspecified organism: Secondary | ICD-10-CM | POA: Diagnosis not present

## 2019-06-01 DIAGNOSIS — I5030 Unspecified diastolic (congestive) heart failure: Secondary | ICD-10-CM | POA: Diagnosis not present

## 2019-06-04 DIAGNOSIS — I4892 Unspecified atrial flutter: Secondary | ICD-10-CM | POA: Diagnosis not present

## 2019-06-04 DIAGNOSIS — I5030 Unspecified diastolic (congestive) heart failure: Secondary | ICD-10-CM | POA: Diagnosis not present

## 2019-06-04 DIAGNOSIS — N183 Chronic kidney disease, stage 3 unspecified: Secondary | ICD-10-CM | POA: Diagnosis not present

## 2019-06-04 DIAGNOSIS — I081 Rheumatic disorders of both mitral and tricuspid valves: Secondary | ICD-10-CM | POA: Diagnosis not present

## 2019-06-04 DIAGNOSIS — Z8701 Personal history of pneumonia (recurrent): Secondary | ICD-10-CM | POA: Diagnosis not present

## 2019-06-04 DIAGNOSIS — D649 Anemia, unspecified: Secondary | ICD-10-CM | POA: Diagnosis not present

## 2019-06-04 DIAGNOSIS — I5033 Acute on chronic diastolic (congestive) heart failure: Secondary | ICD-10-CM | POA: Diagnosis not present

## 2019-06-04 DIAGNOSIS — W19XXXD Unspecified fall, subsequent encounter: Secondary | ICD-10-CM | POA: Diagnosis not present

## 2019-06-04 DIAGNOSIS — I130 Hypertensive heart and chronic kidney disease with heart failure and stage 1 through stage 4 chronic kidney disease, or unspecified chronic kidney disease: Secondary | ICD-10-CM | POA: Diagnosis not present

## 2019-06-04 DIAGNOSIS — S32030D Wedge compression fracture of third lumbar vertebra, subsequent encounter for fracture with routine healing: Secondary | ICD-10-CM | POA: Diagnosis not present

## 2019-06-04 DIAGNOSIS — J9601 Acute respiratory failure with hypoxia: Secondary | ICD-10-CM | POA: Diagnosis not present

## 2019-06-04 DIAGNOSIS — I340 Nonrheumatic mitral (valve) insufficiency: Secondary | ICD-10-CM | POA: Diagnosis not present

## 2019-06-04 DIAGNOSIS — I4891 Unspecified atrial fibrillation: Secondary | ICD-10-CM | POA: Diagnosis not present

## 2019-06-04 DIAGNOSIS — I50810 Right heart failure, unspecified: Secondary | ICD-10-CM | POA: Diagnosis not present

## 2019-06-04 DIAGNOSIS — R5381 Other malaise: Secondary | ICD-10-CM | POA: Diagnosis not present

## 2019-06-04 DIAGNOSIS — Z952 Presence of prosthetic heart valve: Secondary | ICD-10-CM | POA: Diagnosis not present

## 2019-06-04 DIAGNOSIS — E785 Hyperlipidemia, unspecified: Secondary | ICD-10-CM | POA: Diagnosis not present

## 2019-06-04 DIAGNOSIS — S32010D Wedge compression fracture of first lumbar vertebra, subsequent encounter for fracture with routine healing: Secondary | ICD-10-CM | POA: Diagnosis not present

## 2019-06-08 ENCOUNTER — Inpatient Hospital Stay (HOSPITAL_COMMUNITY)
Admission: EM | Admit: 2019-06-08 | Discharge: 2019-06-11 | DRG: 291 | Disposition: A | Discharge status: Home Health | Payer: Medicare HMO | Attending: Internal Medicine | Admitting: Internal Medicine

## 2019-06-08 ENCOUNTER — Emergency Department (HOSPITAL_COMMUNITY): Payer: Medicare HMO

## 2019-06-08 ENCOUNTER — Other Ambulatory Visit: Payer: Self-pay

## 2019-06-08 ENCOUNTER — Encounter (HOSPITAL_COMMUNITY): Payer: Self-pay | Admitting: *Deleted

## 2019-06-08 DIAGNOSIS — N1832 Chronic kidney disease, stage 3b: Secondary | ICD-10-CM | POA: Diagnosis present

## 2019-06-08 DIAGNOSIS — S32030A Wedge compression fracture of third lumbar vertebra, initial encounter for closed fracture: Secondary | ICD-10-CM | POA: Diagnosis not present

## 2019-06-08 DIAGNOSIS — Z20822 Contact with and (suspected) exposure to covid-19: Secondary | ICD-10-CM | POA: Diagnosis present

## 2019-06-08 DIAGNOSIS — I130 Hypertensive heart and chronic kidney disease with heart failure and stage 1 through stage 4 chronic kidney disease, or unspecified chronic kidney disease: Principal | ICD-10-CM | POA: Diagnosis present

## 2019-06-08 DIAGNOSIS — S32010A Wedge compression fracture of first lumbar vertebra, initial encounter for closed fracture: Secondary | ICD-10-CM | POA: Diagnosis not present

## 2019-06-08 DIAGNOSIS — Z9842 Cataract extraction status, left eye: Secondary | ICD-10-CM

## 2019-06-08 DIAGNOSIS — I4821 Permanent atrial fibrillation: Secondary | ICD-10-CM | POA: Diagnosis present

## 2019-06-08 DIAGNOSIS — Z79890 Hormone replacement therapy (postmenopausal): Secondary | ICD-10-CM

## 2019-06-08 DIAGNOSIS — Z950 Presence of cardiac pacemaker: Secondary | ICD-10-CM

## 2019-06-08 DIAGNOSIS — Z79899 Other long term (current) drug therapy: Secondary | ICD-10-CM

## 2019-06-08 DIAGNOSIS — D649 Anemia, unspecified: Secondary | ICD-10-CM | POA: Diagnosis present

## 2019-06-08 DIAGNOSIS — I480 Paroxysmal atrial fibrillation: Secondary | ICD-10-CM | POA: Diagnosis not present

## 2019-06-08 DIAGNOSIS — I495 Sick sinus syndrome: Secondary | ICD-10-CM | POA: Diagnosis present

## 2019-06-08 DIAGNOSIS — I081 Rheumatic disorders of both mitral and tricuspid valves: Secondary | ICD-10-CM | POA: Diagnosis present

## 2019-06-08 DIAGNOSIS — Z8701 Personal history of pneumonia (recurrent): Secondary | ICD-10-CM

## 2019-06-08 DIAGNOSIS — R319 Hematuria, unspecified: Secondary | ICD-10-CM | POA: Diagnosis present

## 2019-06-08 DIAGNOSIS — R52 Pain, unspecified: Secondary | ICD-10-CM | POA: Diagnosis not present

## 2019-06-08 DIAGNOSIS — Z7901 Long term (current) use of anticoagulants: Secondary | ICD-10-CM

## 2019-06-08 DIAGNOSIS — N390 Urinary tract infection, site not specified: Secondary | ICD-10-CM | POA: Diagnosis not present

## 2019-06-08 DIAGNOSIS — S3991XA Unspecified injury of abdomen, initial encounter: Secondary | ICD-10-CM | POA: Diagnosis not present

## 2019-06-08 DIAGNOSIS — I5033 Acute on chronic diastolic (congestive) heart failure: Secondary | ICD-10-CM | POA: Diagnosis present

## 2019-06-08 DIAGNOSIS — S32039A Unspecified fracture of third lumbar vertebra, initial encounter for closed fracture: Secondary | ICD-10-CM | POA: Diagnosis present

## 2019-06-08 DIAGNOSIS — W010XXA Fall on same level from slipping, tripping and stumbling without subsequent striking against object, initial encounter: Secondary | ICD-10-CM | POA: Diagnosis present

## 2019-06-08 DIAGNOSIS — A150 Tuberculosis of lung: Secondary | ICD-10-CM

## 2019-06-08 DIAGNOSIS — R0689 Other abnormalities of breathing: Secondary | ICD-10-CM | POA: Diagnosis not present

## 2019-06-08 DIAGNOSIS — I5031 Acute diastolic (congestive) heart failure: Secondary | ICD-10-CM

## 2019-06-08 DIAGNOSIS — Z8249 Family history of ischemic heart disease and other diseases of the circulatory system: Secondary | ICD-10-CM

## 2019-06-08 DIAGNOSIS — E785 Hyperlipidemia, unspecified: Secondary | ICD-10-CM | POA: Diagnosis present

## 2019-06-08 DIAGNOSIS — Z961 Presence of intraocular lens: Secondary | ICD-10-CM | POA: Diagnosis present

## 2019-06-08 DIAGNOSIS — E039 Hypothyroidism, unspecified: Secondary | ICD-10-CM | POA: Diagnosis present

## 2019-06-08 DIAGNOSIS — Z9841 Cataract extraction status, right eye: Secondary | ICD-10-CM

## 2019-06-08 DIAGNOSIS — J189 Pneumonia, unspecified organism: Secondary | ICD-10-CM

## 2019-06-08 DIAGNOSIS — I340 Nonrheumatic mitral (valve) insufficiency: Secondary | ICD-10-CM | POA: Diagnosis present

## 2019-06-08 DIAGNOSIS — Z888 Allergy status to other drugs, medicaments and biological substances status: Secondary | ICD-10-CM

## 2019-06-08 DIAGNOSIS — S79911A Unspecified injury of right hip, initial encounter: Secondary | ICD-10-CM | POA: Diagnosis not present

## 2019-06-08 DIAGNOSIS — Z823 Family history of stroke: Secondary | ICD-10-CM

## 2019-06-08 DIAGNOSIS — W19XXXA Unspecified fall, initial encounter: Secondary | ICD-10-CM | POA: Diagnosis present

## 2019-06-08 DIAGNOSIS — S32019A Unspecified fracture of first lumbar vertebra, initial encounter for closed fracture: Secondary | ICD-10-CM | POA: Diagnosis present

## 2019-06-08 DIAGNOSIS — R0602 Shortness of breath: Secondary | ICD-10-CM | POA: Diagnosis not present

## 2019-06-08 LAB — CBC WITH DIFFERENTIAL/PLATELET
Abs Immature Granulocytes: 0.01 10*3/uL (ref 0.00–0.07)
Basophils Absolute: 0 10*3/uL (ref 0.0–0.1)
Basophils Relative: 0 %
Eosinophils Absolute: 0 10*3/uL (ref 0.0–0.5)
Eosinophils Relative: 1 %
HCT: 27.9 % — ABNORMAL LOW (ref 36.0–46.0)
Hemoglobin: 8.9 g/dL — ABNORMAL LOW (ref 12.0–15.0)
Immature Granulocytes: 0 %
Lymphocytes Relative: 35 %
Lymphs Abs: 1.3 10*3/uL (ref 0.7–4.0)
MCH: 29.8 pg (ref 26.0–34.0)
MCHC: 31.9 g/dL (ref 30.0–36.0)
MCV: 93.3 fL (ref 80.0–100.0)
Monocytes Absolute: 0.3 10*3/uL (ref 0.1–1.0)
Monocytes Relative: 9 %
Neutro Abs: 2.1 10*3/uL (ref 1.7–7.7)
Neutrophils Relative %: 55 %
Platelets: 126 10*3/uL — ABNORMAL LOW (ref 150–400)
RBC: 2.99 MIL/uL — ABNORMAL LOW (ref 3.87–5.11)
RDW: 28.1 % — ABNORMAL HIGH (ref 11.5–15.5)
WBC: 3.7 10*3/uL — ABNORMAL LOW (ref 4.0–10.5)
nRBC: 1.9 % — ABNORMAL HIGH (ref 0.0–0.2)

## 2019-06-08 LAB — CBC
HCT: 28.8 % — ABNORMAL LOW (ref 36.0–46.0)
Hemoglobin: 9 g/dL — ABNORMAL LOW (ref 12.0–15.0)
MCH: 29.8 pg (ref 26.0–34.0)
MCHC: 31.3 g/dL (ref 30.0–36.0)
MCV: 95.4 fL (ref 80.0–100.0)
Platelets: 132 10*3/uL — ABNORMAL LOW (ref 150–400)
RBC: 3.02 MIL/uL — ABNORMAL LOW (ref 3.87–5.11)
RDW: 28.6 % — ABNORMAL HIGH (ref 11.5–15.5)
WBC: 3.6 10*3/uL — ABNORMAL LOW (ref 4.0–10.5)
nRBC: 1.7 % — ABNORMAL HIGH (ref 0.0–0.2)

## 2019-06-08 LAB — EXPECTORATED SPUTUM ASSESSMENT W GRAM STAIN, RFLX TO RESP C

## 2019-06-08 LAB — COMPREHENSIVE METABOLIC PANEL
ALT: 15 U/L (ref 0–44)
AST: 21 U/L (ref 15–41)
Albumin: 2.8 g/dL — ABNORMAL LOW (ref 3.5–5.0)
Alkaline Phosphatase: 68 U/L (ref 38–126)
Anion gap: 8 (ref 5–15)
BUN: 31 mg/dL — ABNORMAL HIGH (ref 8–23)
CO2: 26 mmol/L (ref 22–32)
Calcium: 8.7 mg/dL — ABNORMAL LOW (ref 8.9–10.3)
Chloride: 101 mmol/L (ref 98–111)
Creatinine, Ser: 1.27 mg/dL — ABNORMAL HIGH (ref 0.44–1.00)
GFR calc Af Amer: 45 mL/min — ABNORMAL LOW (ref >60)
GFR calc non Af Amer: 39 mL/min — ABNORMAL LOW (ref >60)
Glucose, Bld: 107 mg/dL — ABNORMAL HIGH (ref 70–99)
Potassium: 3.7 mmol/L (ref 3.5–5.1)
Sodium: 135 mmol/L (ref 135–145)
Total Bilirubin: 1.8 mg/dL — ABNORMAL HIGH (ref 0.3–1.2)
Total Protein: 6.6 g/dL (ref 6.5–8.1)

## 2019-06-08 LAB — URINALYSIS, ROUTINE W REFLEX MICROSCOPIC
Bacteria, UA: NONE SEEN
Bilirubin Urine: NEGATIVE
Glucose, UA: NEGATIVE mg/dL
Ketones, ur: NEGATIVE mg/dL
Leukocytes,Ua: NEGATIVE
Nitrite: NEGATIVE
Protein, ur: NEGATIVE mg/dL
Specific Gravity, Urine: 1.008 (ref 1.005–1.030)
pH: 5 (ref 5.0–8.0)

## 2019-06-08 LAB — TYPE AND SCREEN
ABO/RH(D): A POS
Antibody Screen: NEGATIVE

## 2019-06-08 LAB — SARS CORONAVIRUS 2 BY RT PCR (HOSPITAL ORDER, PERFORMED IN ~~LOC~~ HOSPITAL LAB): SARS Coronavirus 2: NEGATIVE

## 2019-06-08 LAB — MAGNESIUM: Magnesium: 1.8 mg/dL (ref 1.7–2.4)

## 2019-06-08 LAB — LACTIC ACID, PLASMA
Lactic Acid, Venous: 2.8 mmol/L (ref 0.5–1.9)
Lactic Acid, Venous: 1.6 mmol/L (ref 0.5–1.9)
Lactic Acid, Venous: 1.5 mmol/L (ref 0.5–1.9)

## 2019-06-08 LAB — BRAIN NATRIURETIC PEPTIDE: B Natriuretic Peptide: 660.6 pg/mL — ABNORMAL HIGH (ref 0.0–100.0)

## 2019-06-08 LAB — TSH: TSH: 2.818 u[IU]/mL (ref 0.350–4.500)

## 2019-06-08 LAB — PROCALCITONIN: Procalcitonin: <0.1 ng/mL

## 2019-06-08 LAB — LIPASE, BLOOD: Lipase: 56 U/L — ABNORMAL HIGH (ref 11–51)

## 2019-06-08 LAB — STREP PNEUMONIAE URINARY ANTIGEN: Strep Pneumo Urinary Antigen: NEGATIVE

## 2019-06-08 MED ORDER — ACETAMINOPHEN 650 MG RE SUPP: 650.0000 mg | Freq: Four times a day (QID) | RECTAL | Status: DC | PRN

## 2019-06-08 MED ORDER — SODIUM CHLORIDE 0.9 % IV BOLUS
750.0000 mL | Freq: Once | INTRAVENOUS | Status: AC
  Administered 2019-06-08: 750 mL via INTRAVENOUS

## 2019-06-08 MED ORDER — ACETAMINOPHEN 325 MG PO TABS
650.0000 mg | ORAL_TABLET | Freq: Four times a day (QID) | ORAL | Status: DC | PRN
  Administered 2019-06-08: 325 mg via ORAL
  Administered 2019-06-08 – 2019-06-09 (×2): 650 mg via ORAL
  Administered 2019-06-10: 325 mg via ORAL
  Filled 2019-06-08 (×5): qty 2

## 2019-06-08 MED ORDER — ONDANSETRON HCL 4 MG PO TABS: 4.0000 mg | ORAL_TABLET | Freq: Four times a day (QID) | ORAL | Status: DC | PRN

## 2019-06-08 MED ORDER — METOPROLOL SUCCINATE ER 50 MG PO TB24
50.0000 mg | ORAL_TABLET | Freq: Two times a day (BID) | ORAL | Status: DC
  Administered 2019-06-08: 50 mg via ORAL
  Filled 2019-06-08: qty 1

## 2019-06-08 MED ORDER — LEVOTHYROXINE SODIUM 88 MCG PO TABS
88.0000 ug | ORAL_TABLET | Freq: Every day | ORAL | Status: DC
  Administered 2019-06-08 – 2019-06-11 (×4): 88 ug via ORAL
  Filled 2019-06-08 (×4): qty 1

## 2019-06-08 MED ORDER — POTASSIUM CHLORIDE CRYS ER 10 MEQ PO TBCR
10.0000 meq | EXTENDED_RELEASE_TABLET | Freq: Every day | ORAL | Status: DC
  Administered 2019-06-08 – 2019-06-11 (×4): 10 meq via ORAL
  Filled 2019-06-08 (×4): qty 1

## 2019-06-08 MED ORDER — DILTIAZEM HCL ER COATED BEADS 120 MG PO CP24: 120.0000 mg | ORAL_CAPSULE | ORAL | Status: DC

## 2019-06-08 MED ORDER — CEPHALEXIN 250 MG PO CAPS
250.0000 mg | ORAL_CAPSULE | Freq: Once | ORAL | Status: AC
  Administered 2019-06-08: 250 mg via ORAL
  Filled 2019-06-08: qty 1

## 2019-06-08 MED ORDER — APIXABAN 5 MG PO TABS
5.0000 mg | ORAL_TABLET | Freq: Two times a day (BID) | ORAL | Status: DC
  Administered 2019-06-08 – 2019-06-11 (×6): 5 mg via ORAL
  Filled 2019-06-08 (×7): qty 1

## 2019-06-08 MED ORDER — IOHEXOL 300 MG/ML  SOLN
75.0000 mL | Freq: Once | INTRAMUSCULAR | Status: AC | PRN
  Administered 2019-06-08: 75 mL via INTRAVENOUS

## 2019-06-08 MED ORDER — SODIUM CHLORIDE 0.9% FLUSH
3.0000 mL | Freq: Two times a day (BID) | INTRAVENOUS | Status: DC
  Administered 2019-06-08 – 2019-06-10 (×5): 3 mL via INTRAVENOUS

## 2019-06-08 MED ORDER — ONDANSETRON HCL 4 MG/2ML IJ SOLN: 4.0000 mg | Freq: Four times a day (QID) | INTRAMUSCULAR | Status: DC | PRN

## 2019-06-08 MED ORDER — ZINC GLUCONATE 50 MG PO TABS: 50.0000 mg | ORAL_TABLET | Freq: Every day | ORAL | Status: DC

## 2019-06-08 MED ORDER — SODIUM CHLORIDE (PF) 0.9 % IJ SOLN
INTRAMUSCULAR | Status: AC
  Filled 2019-06-08: qty 50

## 2019-06-08 MED ORDER — SODIUM CHLORIDE 0.9 % IV SOLN: INTRAVENOUS | Status: AC

## 2019-06-08 MED ORDER — FERROUS SULFATE 325 (65 FE) MG PO TABS
325.0000 mg | ORAL_TABLET | Freq: Every day | ORAL | Status: DC
  Administered 2019-06-08 – 2019-06-11 (×4): 325 mg via ORAL
  Filled 2019-06-08 (×4): qty 1

## 2019-06-08 MED ORDER — FUROSEMIDE 40 MG PO TABS: 20.0000 mg | ORAL_TABLET | ORAL | Status: DC

## 2019-06-08 NOTE — Progress Notes (Addendum)
Hospitalist and RR nurse were notified of lactic acid 1.5; Hg-9.0; Bp-102/76. No new orders.

## 2019-06-08 NOTE — Progress Notes (Addendum)
PHARMACIST - PHYSICIAN ORDER COMMUNICATION  CONCERNING: P&T Medication Policy   DESCRIPTION:  This patient's order for:  Zinc Gluconate  has been noted.  This product(s) is an over the counter Zinc supplement. Zinc gluconate 59m would supply 7 mg elemental Zinc.  Formulary Zinc Sulfate capsule 2222mdelivers 5031mlemental Zinc . We cannot supply this small of a dose with our Zinc product   ACTION TAKEN: The pharmacy department is unable to verify this order at this time   GreMinda DittoarmD Pager [phone]/2021, 10:22 AM

## 2019-06-08 NOTE — ED Notes (Signed)
Took abx tab whole with water. Morning meds not reconciled by pharmacy at this time.

## 2019-06-08 NOTE — ED Triage Notes (Signed)
BIB [facility], son told [facility] pt fell 3 days ago, c/o rt hip, lower back area. More tender in sacral/coccyx area, ambulatory at home and has good ROM in lower ext.

## 2019-06-08 NOTE — Plan of Care (Signed)

## 2019-06-08 NOTE — Progress Notes (Signed)
Dr Doristine Bosworth notified of lactic acid of 2.8.

## 2019-06-08 NOTE — ED Provider Notes (Signed)
[facility] DEPT Provider Note   CSN: [id number] Arrival date & time: 06/08/19  0316   Time seen 3:27 AM  History Chief Complaint  Patient presents with  . Fall    Nancy Meadows is a 86 y.o. female.  HPI   Patient presents via EMS.  She states she fell about a week ago.  She states she has been able to walk since she fell.  She states she is having pain on her right side more than the left and she puts her hand in her right lower quadrant.  She denies any abdominal pain.  She denies nausea or vomiting.  She states she has a dry cough without fever.  She states she was diagnosed with pneumonia a few weeks ago and that is better now.  EMS states her son states she fell couple days ago.Marland Kitchen  PCP Merrilee Seashore, MD   Past Medical History:  Diagnosis Date  . Acute head trauma 02/09/2012  . Acute respiratory failure ([place]) 10/22/2017  . Anemia 10/22/2017  . Arthritis    ddd-- occ. pain  . Cholelithiasis   . CKD (chronic kidney disease) stage 3, GFR 30-59 ml/min 03/27/2018  . DIZZINESS 03/07/2010   Qualifier: Diagnosis of  By: Burnett Kanaris    . Dyspnea 03/27/2018  . Eczema   . Goiter   . HLD (hyperlipidemia)   . Hx of cardiovascular stress test    a. ETT-MV 7/14:  Low risk, ant defect c/w soft tissue atten., no ischemia, EF 77%  . HYPERLIPIDEMIA 03/08/2010   Qualifier: Diagnosis of  By: Stanford Breed, MD, Kandyce Rud   . Hypothyroidism 03/08/2010   Qualifier: Diagnosis of  By: Stanford Breed, MD, Kandyce Rud   . Hypoxia   . Lesion of bladder    hematuria/ frequency  . Palpitations 08/17/2012  . Paroxysmal atrial fibrillation ([place]) 01/2016   cahds2vasc sore is 3.  consider anticoagulation if afib burden increases  . Pleural effusion, bilateral 10/22/2017  . SAH (subarachnoid hemorrhage) ([place]) 02/09/2012  . SDH (subdural hematoma) ([facility])    in the setting of syncope and fall prior to PPM implant, now resolved  . Sick sinus syndrome ([place])     . Subdural hematoma, acute ([place]) 02/09/2012  . Syncope    pauses of >4 seconds    Patient Active Problem List   Diagnosis Date Noted  . Compression fracture of first lumbar vertebra ([place]) 06/08/2019  . Closed compression fracture of L3 lumbar vertebra, initial encounter ([place]) 06/08/2019  . Dyspnea 03/27/2018  . CKD (chronic kidney disease) stage 3, GFR 30-59 ml/min 03/27/2018  . AF (paroxysmal atrial fibrillation) ([place]) 03/27/2018  . Hypoxia   . Atrial fibrillation with RVR ([place])   . Acute respiratory failure ([place]) 10/22/2017  . Pleural effusion, bilateral 10/22/2017  . Anemia 10/22/2017  . Premature atrial contraction 09/05/2014  . Sick sinus syndrome ([place]) 03/07/2013  . Palpitations 08/17/2012  . Syncope 02/09/2012  . Acute head trauma 02/09/2012  . Subdural hematoma, acute ([place]) 02/09/2012  . SAH (subarachnoid hemorrhage) ([place]) 02/09/2012  . Hypothyroidism 03/08/2010  . HYPERLIPIDEMIA 03/08/2010  . CARDIAC MURMUR 03/08/2010  . CHEST PAIN 03/08/2010  . DIZZINESS 03/07/2010    Past Surgical History:  Procedure Laterality Date  . ABDOMINAL HYSTERECTOMY  age 67  . APPENDECTOMY  age 76  . bilateral salpingoophectomy  age 28  . CATARACT EXTRACTION W/ INTRAOCULAR LENS  IMPLANT, BILATERAL  2012  . CYSTOSCOPY WITH BIOPSY  11/21/2010   Procedure: CYSTOSCOPY  WITH BIOPSY;  Surgeon: Molli Hazard, MD;  Location: [facility];  Service: Urology;  Laterality: N/A;  CYSTOSCOPY WITH BLADDER BIOPSY  AND INSTILLATION OF INDIGO CARMINE  . CYSTOSCOPY/RETROGRADE/URETEROSCOPY  11/21/2010   Procedure: CYSTOSCOPY/RETROGRADE/URETEROSCOPY;  Surgeon: Molli Hazard, MD;  Location: [facility];  Service: Urology;  Laterality: Left;  . gallstones removed  age 80  . LOOP RECORDER IMPLANT N/A 12/31/2012   Procedure: LOOP RECORDER IMPLANT;  Surgeon: Coralyn Mark, MD;  Location: [facility] CATH LAB;  Service: Cardiovascular;  Laterality: N/A;  . PACEMAKER INSERTION   05-20-2013   MDT ADDRL1 pacemaker implanted by Dr Rayann Heman for SSS and symptomatic bradycardia  . PERMANENT PACEMAKER INSERTION Left 05/20/2013   Procedure: PERMANENT PACEMAKER INSERTION;  Surgeon: Coralyn Mark, MD;  Location: [facility] CATH LAB;  Service: Cardiovascular;  Laterality: Left;     OB History   No obstetric history on file.     Family History  Problem Relation Age of Onset  . Congestive Heart Failure Father   . Heart disease Father   . Stroke Mother     Social History   Tobacco Use  . Smoking status: Never Smoker  . Smokeless tobacco: Never Used  . Tobacco comment: tobacco use - no  Substance Use Topics  . Alcohol use: No  . Drug use: No    Home Medications Prior to Admission medications   Medication Sig Start Date End Date Taking? Authorizing Provider  Cholecalciferol 25 MCG (1000 UT) tablet Take 1 tablet by mouth daily.   Yes [provider]  diltiazem (CARDIZEM CD) 120 MG 24 hr capsule Take 120 mg by mouth every other day. 04/27/19  Yes [provider]  ELIQUIS 5 MG TABS tablet TAKE 1 TABLET BY MOUTH TWICE A DAY Patient taking differently: Take 5 mg by mouth 2 (two) times daily.  11/23/18  Yes Allred, Jeneen Rinks, MD  ferrous sulfate 325 (65 FE) MG tablet Take 325 mg by mouth daily. 04/20/19  Yes [provider]  furosemide (LASIX) 20 MG tablet Take 1 tablet (20 mg total) by mouth daily. Take 1 tablet at 2 PM every day Patient taking differently: Take 20 mg by mouth every other day.  03/28/18 06/08/19 Yes Georgette Shell, MD  levothyroxine (SYNTHROID, LEVOTHROID) 88 MCG tablet Take 88 mcg by mouth every morning.    Yes [provider]  metoprolol succinate (TOPROL XL) 50 MG 24 hr tablet Take 1 tablet (50 mg total) by mouth 2 (two) times daily. 01/20/19 01/20/20 Yes Baldwin Jamaica, PA-C  ondansetron (ZOFRAN) 4 MG tablet Take 4 mg by mouth 2 (two) times daily as needed for nausea or vomiting.  05/21/19  Yes [provider]  potassium  chloride (KLOR-CON) 10 MEQ tablet Take 1 tablet (10 mEq total) by mouth daily. 12/08/18  Yes Shirley Friar, PA-C  zinc gluconate 50 MG tablet Take 50 mg by mouth daily.   Yes [provider]    Allergies    Acetaminophen and Meclizine  Review of Systems   Review of Systems  All other systems reviewed and are negative.   Physical Exam Updated Vital Signs BP 102/67   Pulse 73   Temp 98.7 F (37.1 C)   Resp 18   SpO2 100%   Physical Exam Vitals and nursing note reviewed.  Constitutional:      Appearance: Normal appearance. She is normal weight.  HENT:     Head: Normocephalic and atraumatic.  Right Ear: External ear normal.     Left Ear: External ear normal.  Eyes:     Extraocular Movements: Extraocular movements intact.     Conjunctiva/sclera: Conjunctivae normal.     Pupils: Pupils are equal, round, and reactive to light.  Cardiovascular:     Rate and Rhythm: Normal rate and regular rhythm.  Pulmonary:     Effort: Pulmonary effort is normal. No respiratory distress.     Breath sounds: Normal breath sounds.  Abdominal:     General: Bowel sounds are normal.     Palpations: Abdomen is soft.     Tenderness: There is no abdominal tenderness.  Musculoskeletal:        General: Swelling present. No tenderness, deformity or signs of injury. Normal range of motion.     Cervical back: Normal range of motion.     Comments: Patient has good flexion and extension of her left and right hip and knee.  She does not appear to be painful.  When I palpate her back she is tender in the lower lumbar area of her back.  Patient consistently puts her pain over her right lower quadrant as to where her discomfort is located.  Skin:    Capillary Refill: Capillary refill takes less than 2 seconds.  Neurological:     General: No focal deficit present.     Mental Status: She is alert and oriented to person, place, and time.     Cranial Nerves: No cranial nerve deficit.    Psychiatric:        Mood and Affect: Mood normal.        Behavior: Behavior normal.        Thought Content: Thought content normal.     ED Results / Procedures / Treatments   Labs (all labs ordered are listed, but only abnormal results are displayed) Results for orders placed or performed during the hospital encounter of 06/08/19  Comprehensive metabolic panel  Result Value Ref Range   Sodium 135 135 - 145 mmol/L   Potassium 3.7 3.5 - 5.1 mmol/L   Chloride 101 98 - 111 mmol/L   CO2 26 22 - 32 mmol/L   Glucose, Bld 107 (H) 70 - 99 mg/dL   BUN 31 (H) 8 - 23 mg/dL   Creatinine, Ser 1.27 (H) 0.44 - 1.00 mg/dL   Calcium 8.7 (L) 8.9 - 10.3 mg/dL   Total Protein 6.6 6.5 - 8.1 g/dL   Albumin 2.8 (L) 3.5 - 5.0 g/dL   AST 21 15 - 41 U/L   ALT 15 0 - 44 U/L   Alkaline Phosphatase 68 38 - 126 U/L   Total Bilirubin 1.8 (H) 0.3 - 1.2 mg/dL   GFR calc non Af Amer 39 (L) >60 mL/min   GFR calc Af Amer 45 (L) >60 mL/min   Anion gap 8 5 - 15  CBC with Differential  Result Value Ref Range   WBC 3.7 (L) 4.0 - 10.5 K/uL   RBC 2.99 (L) 3.87 - 5.11 MIL/uL   Hemoglobin 8.9 (L) 12.0 - 15.0 g/dL   HCT 27.9 (L) 36.0 - 46.0 %   MCV 93.3 80.0 - 100.0 fL   MCH 29.8 26.0 - 34.0 pg   MCHC 31.9 30.0 - 36.0 g/dL   RDW 28.1 (H) 11.5 - 15.5 %   Platelets 126 (L) 150 - 400 K/uL   nRBC 1.9 (H) 0.0 - 0.2 %   Neutrophils Relative % 55 %   Neutro Abs 2.1  1.7 - 7.7 K/uL   Lymphocytes Relative 35 %   Lymphs Abs 1.3 0.7 - 4.0 K/uL   Monocytes Relative 9 %   Monocytes Absolute 0.3 0.1 - 1.0 K/uL   Eosinophils Relative 1 %   Eosinophils Absolute 0.0 0.0 - 0.5 K/uL   Basophils Relative 0 %   Basophils Absolute 0.0 0.0 - 0.1 K/uL   Immature Granulocytes 0 %   Abs Immature Granulocytes 0.01 0.00 - 0.07 K/uL   Polychromasia PRESENT    Target Cells PRESENT   Urinalysis, Routine w reflex microscopic  Result Value Ref Range   Color, Urine YELLOW YELLOW   APPearance CLEAR CLEAR   Specific Gravity, Urine 1.008  1.005 - 1.030   pH 5.0 5.0 - 8.0   Glucose, UA NEGATIVE NEGATIVE mg/dL   Hgb urine dipstick MODERATE (A) NEGATIVE   Bilirubin Urine NEGATIVE NEGATIVE   Ketones, ur NEGATIVE NEGATIVE mg/dL   Protein, ur NEGATIVE NEGATIVE mg/dL   Nitrite NEGATIVE NEGATIVE   Leukocytes,Ua NEGATIVE NEGATIVE   RBC / HPF 0-5 0 - 5 RBC/hpf   WBC, UA 6-10 0 - 5 WBC/hpf   Bacteria, UA NONE SEEN NONE SEEN  Lipase, blood  Result Value Ref Range   Lipase 56 (H) 11 - 51 U/L  Brain natriuretic peptide  Result Value Ref Range   B Natriuretic Peptide 660.6 (H) 0.0 - 100.0 pg/mL   Laboratory interpretation all normal except minor and significant elevation of lipase, minor worsening of her chronic anemia from 10.7 on December 08, 2018, mild worsening of her renal insufficiency compared to April 2 when her BUN was 14 and her creatinine was 1.15    EKG None  Radiology DG Chest 2 View  Result Date: 06/08/2019 CLINICAL DATA:  Short of breath and weakness.  Question CHF EXAM: CHEST - 2 VIEW COMPARISON:  04/05/2019.  CT abdomen pelvis 06/08/2019 FINDINGS: Cardiac enlargement. Dual lead pacemaker in good position and unchanged. Negative for congestive heart failure. No edema or effusion Interval development of right upper lobe infiltrates with possible cavitation. Probable pneumonia. No acute skeletal abnormality. IMPRESSION: Cardiac enlargement without heart failure Interval development of extensive right upper lobe infiltrate with probable cavitation. Probable pneumonia. Electronically Signed   By: Franchot Gallo M.D.   On: 06/08/2019 08:05   CT Abdomen Pelvis W Contrast  Result Date: 06/08/2019 CLINICAL DATA:  Abdominal distension, fall 3 days prior, right hip and lower back pain, sacrococcygeal tenderness as well. EXAM: CT ABDOMEN AND PELVIS WITH CONTRAST TECHNIQUE: Multidetector CT imaging of the abdomen and pelvis was performed using the standard protocol following bolus administration of intravenous contrast.  CONTRAST:  35m OMNIPAQUE IOHEXOL 300 MG/ML  SOLN COMPARISON:  CT 06/26/2016 FINDINGS: Lower chest: There is a small to moderate right pleural effusion with adjacent atelectatic changes in the right lung base. More bandlike areas of opacity in left lung base likely reflect atelectasis and/or scarring. There is cardiomegaly with predominantly right atrial enlargement with notable reflux of contrast into the hepatic veins and IVC. Pacer leads are seen at the cardiac apex and right atrium. No pericardial effusion. Hepatobiliary: Slight heterogeneity of enhancement on delayed phase imaging towards the periphery of the right lobe liver may reflect some features of chronic hepatic congestion/altered perfusion. No concerning focal liver lesion is seen. Smooth liver surface contour. Gallbladder and biliary tree are unremarkable without visible calcified gallstones. Pancreas: Mild pancreatic atrophy. No focal concerning pancreatic lesion. No ductal dilatation or peripancreatic inflammation. Spleen: Normal in size without focal  abnormality. Adrenals/Urinary Tract: Normal adrenal glands. Kidneys enhance and excrete symmetrically. Stable appearance of a 2.2 cm fluid attenuation cyst in the posterior lower pole left kidney. A nonobstructing 1.4 cm calculus is seen in the left lower pole as well. No concerning renal lesion, obstructive urolithiasis or hydronephrosis. Urinary bladder is largely decompressed at the time of exam and therefore poorly evaluated by CT imaging. No gross bladder abnormality. Stomach/Bowel: Distal esophagus, stomach and duodenum are unremarkable. No small bowel thickening or dilatation. The appendix is surgically absent. Chronic intramural fat throughout the colon is a nonspecific finding that can be seen both as sequela of prior inflammatory bowel disease or obese body habitus. No significant acute colonic wall thickening or dilatation. No pericolonic stranding or inflammatory change. Vascular/Lymphatic:  Atherosclerotic calcifications throughout the abdominal aorta and branch vessels. No aneurysm or ectasia. Reflux of contrast into the IVC and hepatic veins as above. No enlarged abdominopelvic lymph nodes. Reproductive: Uterus is surgically absent. No concerning adnexal lesions. Other: Diffuse mild body wall edema as well as some central mesenteric edematous change. No abdominopelvic free air. There is a small volume of low-attenuation free fluid in the deep pelvis of unclear etiology. No bowel containing hernias. Musculoskeletal: There is new anterior wedging compression deformity of the L1 vertebral body with approximately 35% height loss anteriorly (AO spine A1). Additional acute anterior wedging compression deformity of the L3 vertebral body with 15% height loss anteriorly (AO spine A1). No other acute fracture or suspicious osseous lesion is seen. No visible sacral insufficiency fractures. The bones of the pelvis are intact and congruent with arthrosis at the symphysis pubis likely reflecting sequela of prior osteitis pubis. There is levocurvature of the lumbar spine with an apex at the L3 level. Partial ankylosis along the left lateral aspect of the L5-S1 disc space is unchanged from prior. Stable hemangiomata at L4. IMPRESSION: 1. Acute anterior wedging compression deformity of the L1 vertebral body with 35% height loss anteriorly (AO spine A1). 2. Acute anterior wedging compression deformity of the L3 vertebral body with 15% height loss anteriorly (AO spine A1). 3. Features suggestive of anasarca including a small to moderate right pleural effusion, diffuse body wall edema and central mesenteric edematous changes. Small volume of low-attenuation fluid in the deep pelvis is favored to be secondary to this volume third-spacing/fluid overload rather than an occult bowel injury though should correlate with serial abdominal exam findings. 4. No other acute traumatic findings in the abdomen or pelvis. 5.  Cardiomegaly with predominantly right atrial enlargement with notable reflux of contrast into the hepatic veins and IVC, suggestive of underlying right heart failure. Heterogeneous attenuation of the liver particularly in the right lobe may reflect some underlying hepatic congestion/altered perfusion as well. 6. Nonobstructing left nephrolithiasis. 7. Chronic intramural fat throughout the colon is a nonspecific finding that can be seen both as sequela of prior inflammatory bowel disease or obese body habitus. 8. Aortic Atherosclerosis (ICD10-I70.0). Electronically Signed   By: Lovena Le M.D.   On: 06/08/2019 06:26   DG Hip Unilat W or Wo Pelvis 2-3 Views Right  Result Date: 06/08/2019 CLINICAL DATA:  Fall a few days ago, hip pain EXAM: DG HIP (WITH OR WITHOUT PELVIS) 2-3V RIGHT COMPARISON:  None. FINDINGS: The bones of the pelvis are intact and congruent. Mild degenerative changes in the spine and SI joints. Additional arthrosis at the symphysis pubis. Proximal femora are intact and normally located. There is moderate bilateral hip arthrosis. Few phleboliths are noted in the pelvis.  Nonspecific air distention of the colon without high-grade bowel obstruction. There is a diffusely edematous appearance of the soft tissues. IMPRESSION: 1. No acute osseous abnormality. 2. Nonspecific air distention of the colon without high-grade bowel obstruction. 3. Question some diffuse mild edematous changes, correlate with fluid status. Electronically Signed   By: Lovena Le M.D.   On: 06/08/2019 04:27    Procedures Procedures (including critical care time)  Medications Ordered in ED Medications  sodium chloride (PF) 0.9 % injection (has no administration in time range)  cephALEXin (KEFLEX) capsule 250 mg (has no administration in time range)  diltiazem (CARDIZEM CD) 24 hr capsule 120 mg (has no administration in time range)  furosemide (LASIX) tablet 20 mg (has no administration in time range)  metoprolol  succinate (TOPROL-XL) 24 hr tablet 50 mg (has no administration in time range)  levothyroxine (SYNTHROID) tablet 88 mcg (has no administration in time range)  apixaban (ELIQUIS) tablet 5 mg (has no administration in time range)  ferrous sulfate tablet 325 mg (has no administration in time range)  zinc gluconate tablet 50 mg (has no administration in time range)  potassium chloride (KLOR-CON) CR tablet 10 mEq (has no administration in time range)  sodium chloride flush (NS) 0.9 % injection 3 mL (has no administration in time range)  acetaminophen (TYLENOL) tablet 650 mg (has no administration in time range)    Or  acetaminophen (TYLENOL) suppository 650 mg (has no administration in time range)  ondansetron (ZOFRAN) tablet 4 mg (has no administration in time range)    Or  ondansetron (ZOFRAN) injection 4 mg (has no administration in time range)  iohexol (OMNIPAQUE) 300 MG/ML solution 75 mL (75 mLs Intravenous Contrast Given 06/08/19 0553)    ED Course  I have reviewed the triage vital signs and the nursing notes.  Pertinent labs & imaging results that were available during my care of the patient were reviewed by me and considered in my medical decision making (see chart for details).    MDM Rules/Calculators/A&P                      Patient states her hip hurt however she has excellent range of motion of her hip without difficulty.  X-ray was obtained.  She also appeared to put her hand in her right lower quadrant pretty consistently as being painful.  After reviewing her x-ray of her hip which showed some dilatation of a area of the colon, CT abdomen/pelvis was done.  I talked to the patient and she now delineates that her pain is in her lower midline back.  She was unaware of the compression fractures.  She is agreeable to getting a TLSO.  She states she was in the hospital last week and they were adjusting her medication.  She states she has had more swelling of her legs and getting short  of breath.  She also states she supposed to have some type of heart valve surgery this week.  Patient states she was in a nursing rehab facility until last Thursday, May 20.  She states her care has been at [facility] however I do not see any recent admissions there either.  Patient now states she fell several weeks ago.  Patient really wants to go home, she states she lives alone however her son and a friend visited her.  She is noted to be laying flat in bed comfortably breathing.  However when I talk to her her pulse ox varies from  88 to 98%.  She states that did that in the hospital also.  08:15 AM Dr Doristine Bosworth, hospitalist will admit  Final Clinical Impression(s) / ED Diagnoses Final diagnoses:  Compression fracture of L1 lumbar vertebra, closed, initial encounter [facility])  Urinary tract infection with hematuria, site unspecified  Anemia, unspecified type  Fall, initial encounter  Chronic pneumonia  Pulmonary tuberculosis with cavitation    Rx / DC Orders  Plan admission  Rolland Porter, MD, Barbette Or, MD 06/08/19 [phone]

## 2019-06-08 NOTE — ED Notes (Signed)
Covid swab sent to lab. Pt is asymptomatic and sts she received the vaccine.

## 2019-06-08 NOTE — Progress Notes (Signed)
pt is having periods of sleep apnea. While sleeping, 8-14 rapids breaths and then about 15 seconds that I cannot see her chest rise and fall. during breathing her sat is high 90's. during apnea her sat drops to about 91 and then she stirs and begins the rapid breaths again. BP is 89/51.  Pt states she is not dizzy/light headed.  Dr Doristine Bosworth aware. continuous pulse ox ordered.  Will continue to monitor BP.

## 2019-06-08 NOTE — ED Notes (Signed)
Pure wick has been placed. Suction set to 86mHg.

## 2019-06-08 NOTE — Progress Notes (Signed)
[facility]ed [facility] for TLSO braceOrthopedic Tech Progress Note Patient Details:  Nancy Meadows Jun 05, 1932 [id number]  Patient ID: Nancy Meadows, female   DOB: 1932/03/16, 86 y.o.   MRN: [id number]   [name] 06/08/2019, 9:40 AMCalled [facility] for TLSO brace.

## 2019-06-08 NOTE — H&P (Signed)
History and Physical    Nancy Meadows RCV:[id number] DOB: 08-Sep-1932 DOA: 06/08/2019  PCP: Merrilee Seashore, MD  Patient coming from: home  I have personally briefly reviewed patient's old medical records in [place]  Chief Complaint: back pain  HPI: Nancy Meadows is a 86 y.o. female with medical history significant of PAFIB, CKD stage 3b, hyperlipidemia was brought into the emergency department due to back pain.  Patient herself is a poor historian so I called patient's son Nancy Meadows and ironically, he received a phone call from patient's PCP at the same time so we all had a conference call.  Based on the history I was provided, patient had some issues with a heart rate and blood pressure about 6 weeks ago in clinic.  She was referred to cardiology.  She had outpatient cardiac cath and was found to have clean coronary arteries however she had echo where she was found to have 55% ejection fraction but moderate to severe MR for which she is scheduled to have outpatient percutaneous repair but in the meantime, she developed some cough and shortness of breath about 3 weeks ago for which she was hospitalized at [facility] and was diagnosed with pneumonia.  According to history provided by PCP, she was in ICU for 48 hours but never required any intubation.  She was septic and acute hypoxic respiratory failure was also there.  She was treated for pneumonia in the right upper lobe for at least 7 days with antibiotics.  She was discharged to rehab about 2 weeks ago and from there she was discharged home on last Thursday, May 20.  According to son, the day she returned to home, she fell.  Since then she has been complaining of back pain.  This has been getting worse so she was brought into the emergency department.  No other symptoms that patient had entertained however upon asking from patient directly, she does tell me that she has intermittent shortness of breath but no  cough, fever.  She initially complained of back pain and right hip pain to ED physician but to me she is complaining of lower abdominal pain.  Denies any urinary complaints or any complaints with a bowel movement.   ED Course: Upon arrival to ED, she was hemodynamically stable.  CBC showed leukopenia with a stable hemoglobin.  CMP showed a stable CKD stage IIIb.  Electrolytes normal.  Due to her complaint of right hip pain, right hip x-ray was obtained which did not show any fracture.  It showed dilatation of the bowel so CT abdomen and pelvis with IV contrast was obtained which showed gas in the bowel but no obstruction.  She was also found to have L1 and L3 compression fracture.  Chest x-ray was done which showed right upper lobe cavitary lesion.  TLSO brace was ordered by ED physician and initial plan was to send her home but patient briefly dropped her oxygen saturation to 88% but then it became better to 94% on room air.  Hospital service were then consulted to admit the patient for observation.  Review of Systems: As per HPI otherwise negative.    Past Medical History:  Diagnosis Date  . Acute head trauma 02/09/2012  . Acute respiratory failure ([place]) 10/22/2017  . Anemia 10/22/2017  . Arthritis    ddd-- occ. pain  . Cholelithiasis   . CKD (chronic kidney disease) stage 3, GFR 30-59 ml/min 03/27/2018  . DIZZINESS 03/07/2010   Qualifier:  Diagnosis of  By: Burnett Kanaris    . Dyspnea 03/27/2018  . Eczema   . Goiter   . HLD (hyperlipidemia)   . Hx of cardiovascular stress test    a. ETT-MV 7/14:  Low risk, ant defect c/w soft tissue atten., no ischemia, EF 77%  . HYPERLIPIDEMIA 03/08/2010   Qualifier: Diagnosis of  By: Stanford Breed, MD, Kandyce Rud   . Hypothyroidism 03/08/2010   Qualifier: Diagnosis of  By: Stanford Breed, MD, Kandyce Rud   . Hypoxia   . Lesion of bladder    hematuria/ frequency  . Palpitations 08/17/2012  . Paroxysmal atrial fibrillation ([place]) 01/2016    cahds2vasc sore is 3.  consider anticoagulation if afib burden increases  . Pleural effusion, bilateral 10/22/2017  . SAH (subarachnoid hemorrhage) ([place]) 02/09/2012  . SDH (subdural hematoma) ([facility])    in the setting of syncope and fall prior to PPM implant, now resolved  . Sick sinus syndrome ([place])   . Subdural hematoma, acute ([place]) 02/09/2012  . Syncope    pauses of >4 seconds    Past Surgical History:  Procedure Laterality Date  . ABDOMINAL HYSTERECTOMY  age 79  . APPENDECTOMY  age 74  . bilateral salpingoophectomy  age 77  . CATARACT EXTRACTION W/ INTRAOCULAR LENS  IMPLANT, BILATERAL  2012  . CYSTOSCOPY WITH BIOPSY  11/21/2010   Procedure: CYSTOSCOPY WITH BIOPSY;  Surgeon: Molli Hazard, MD;  Location: [facility];  Service: Urology;  Laterality: N/A;  CYSTOSCOPY WITH BLADDER BIOPSY  AND INSTILLATION OF INDIGO CARMINE  . CYSTOSCOPY/RETROGRADE/URETEROSCOPY  11/21/2010   Procedure: CYSTOSCOPY/RETROGRADE/URETEROSCOPY;  Surgeon: Molli Hazard, MD;  Location: [facility];  Service: Urology;  Laterality: Left;  . gallstones removed  age 59  . LOOP RECORDER IMPLANT N/A 12/31/2012   Procedure: LOOP RECORDER IMPLANT;  Surgeon: Coralyn Mark, MD;  Location: [facility] CATH [facility];  Service: Cardiovascular;  Laterality: N/A;  . PACEMAKER INSERTION  05-20-2013   MDT ADDRL1 pacemaker implanted by Dr Rayann Heman for SSS and symptomatic bradycardia  . PERMANENT PACEMAKER INSERTION Left 05/20/2013   Procedure: PERMANENT PACEMAKER INSERTION;  Surgeon: Coralyn Mark, MD;  Location: [facility] CATH LAB;  Service: Cardiovascular;  Laterality: Left;     reports that she has never smoked. She has never used smokeless tobacco. She reports that she does not drink alcohol or use drugs.  Allergies  Allergen Reactions  . Acetaminophen Other (See Comments)    Syncope  . Meclizine Other (See Comments)    Increased dizziness and confusion    Family History  Problem Relation Age of Onset    . Congestive Heart Failure Father   . Heart disease Father   . Stroke Mother     Prior to Admission medications   Medication Sig Start Date End Date Taking? Authorizing Provider  Cholecalciferol 25 MCG (1000 UT) tablet Take 1 tablet by mouth daily.   Yes [provider]  diltiazem (CARDIZEM CD) 120 MG 24 hr capsule Take 120 mg by mouth every other day. 04/27/19  Yes [provider]  ELIQUIS 5 MG TABS tablet TAKE 1 TABLET BY MOUTH TWICE A DAY Patient taking differently: Take 5 mg by mouth 2 (two) times daily.  11/23/18  Yes Allred, Jeneen Rinks, MD  ferrous sulfate 325 (65 FE) MG tablet Take 325 mg by mouth daily. 04/20/19  Yes [provider]  furosemide (LASIX) 20 MG tablet Take 1 tablet (20 mg total) by mouth daily. Take 1 tablet at 2  PM every day Patient taking differently: Take 20 mg by mouth every other day.  03/28/18 06/08/19 Yes Georgette Shell, MD  levothyroxine (SYNTHROID, LEVOTHROID) 88 MCG tablet Take 88 mcg by mouth every morning.    Yes [provider]  metoprolol succinate (TOPROL XL) 50 MG 24 hr tablet Take 1 tablet (50 mg total) by mouth 2 (two) times daily. 01/20/19 01/20/20 Yes Baldwin Jamaica, PA-C  ondansetron (ZOFRAN) 4 MG tablet Take 4 mg by mouth 2 (two) times daily as needed for nausea or vomiting.  05/21/19  Yes [provider]  potassium chloride (KLOR-CON) 10 MEQ tablet Take 1 tablet (10 mEq total) by mouth daily. 12/08/18  Yes Shirley Friar, PA-C  zinc gluconate 50 MG tablet Take 50 mg by mouth daily.   Yes [provider]    Physical Exam: Vitals:   06/08/19 0329 06/08/19 0614 06/08/19 0645 06/08/19 0700  BP: 111/74 114/74  102/67  Pulse: 87 74 73   Resp: 18 18    Temp: 98.7 F (37.1 C)     SpO2: 93% 100% 100%     Constitutional: NAD, calm, comfortable Vitals:   06/08/19 0329 06/08/19 0614 06/08/19 0645 06/08/19 0700  BP: 111/74 114/74  102/67  Pulse: 87 74 73   Resp: 18 18    Temp: 98.7 F (37.1  C)     SpO2: 93% 100% 100%    Eyes: PERRL, lids and conjunctivae normal ENMT: Mucous membranes are moist. Posterior pharynx clear of any exudate or lesions.Normal dentition.  Neck: normal, supple, no masses, no thyromegaly Respiratory: clear to auscultation bilaterally, no wheezing, no crackles. Normal respiratory effort. No accessory muscle use.  Cardiovascular: Regular rate and rhythm, no murmurs / rubs / gallops. No extremity edema. 2+ pedal pulses. No carotid bruits.  Abdomen: no tenderness, no masses palpated. No hepatosplenomegaly. Bowel sounds positive.  Musculoskeletal: no clubbing / cyanosis. No joint deformity upper and lower extremities. Good ROM, no contractures. Normal muscle tone.  Lower back pain. Skin: no rashes, lesions, ulcers. No induration Neurologic: CN 2-12 grossly intact. Sensation intact, DTR normal. Strength 5/5 in all 4.  Psychiatric: Poor judgment and insight. Alert and oriented x 3.  Flat mood   Labs on Admission: I have personally reviewed following labs and imaging studies  CBC: Recent Labs  Lab 06/08/19 0424  WBC 3.7*  NEUTROABS 2.1  HGB 8.9*  HCT 27.9*  MCV 93.3  PLT 616*   Basic Metabolic Panel: Recent Labs  Lab 06/08/19 0424  NA 135  K 3.7  CL 101  CO2 26  GLUCOSE 107*  BUN 31*  CREATININE 1.27*  CALCIUM 8.7*   GFR: CrCl cannot be calculated (Unknown ideal weight.). Liver Function Tests: Recent Labs  Lab 06/08/19 0424  AST 21  ALT 15  ALKPHOS 68  BILITOT 1.8*  PROT 6.6  ALBUMIN 2.8*   Recent Labs  Lab 06/08/19 0424  LIPASE 56*   No results for input(s): AMMONIA in the last 168 hours. Coagulation Profile: No results for input(s): INR, PROTIME in the last 168 hours. Cardiac Enzymes: No results for input(s): CKTOTAL, CKMB, CKMBINDEX, TROPONINI in the last 168 hours. BNP (last 3 results) No results for input(s): PROBNP in the last 8760 hours. HbA1C: No results for input(s): HGBA1C in the last 72 hours. CBG: No results  for input(s): GLUCAP in the last 168 hours. Lipid Profile: No results for input(s): CHOL, HDL, LDLCALC, TRIG, CHOLHDL, LDLDIRECT in the last 72 hours. Thyroid Function Tests:  No results for input(s): TSH, T4TOTAL, FREET4, T3FREE, THYROIDAB in the last 72 hours. Anemia Panel: No results for input(s): VITAMINB12, FOLATE, FERRITIN, TIBC, IRON, RETICCTPCT in the last 72 hours. Urine analysis:    Component Value Date/Time   COLORURINE YELLOW 06/08/2019 0533   APPEARANCEUR CLEAR 06/08/2019 0533   LABSPEC 1.008 06/08/2019 0533   PHURINE 5.0 06/08/2019 0533   GLUCOSEU NEGATIVE 06/08/2019 0533   HGBUR MODERATE (A) 06/08/2019 0533   BILIRUBINUR NEGATIVE 06/08/2019 0533   KETONESUR NEGATIVE 06/08/2019 0533   PROTEINUR NEGATIVE 06/08/2019 0533   UROBILINOGEN 0.2 02/09/2012 0017   NITRITE NEGATIVE 06/08/2019 0533   LEUKOCYTESUR NEGATIVE 06/08/2019 0533    Radiological Exams on Admission: DG Chest 2 View  Result Date: 06/08/2019 CLINICAL DATA:  Short of breath and weakness.  Question CHF EXAM: CHEST - 2 VIEW COMPARISON:  04/05/2019.  CT abdomen pelvis 06/08/2019 FINDINGS: Cardiac enlargement. Dual lead pacemaker in good position and unchanged. Negative for congestive heart failure. No edema or effusion Interval development of right upper lobe infiltrates with possible cavitation. Probable pneumonia. No acute skeletal abnormality. IMPRESSION: Cardiac enlargement without heart failure Interval development of extensive right upper lobe infiltrate with probable cavitation. Probable pneumonia. Electronically Signed   By: Franchot Gallo M.D.   On: 06/08/2019 08:05   CT Abdomen Pelvis W Contrast  Result Date: 06/08/2019 CLINICAL DATA:  Abdominal distension, fall 3 days prior, right hip and lower back pain, sacrococcygeal tenderness as well. EXAM: CT ABDOMEN AND PELVIS WITH CONTRAST TECHNIQUE: Multidetector CT imaging of the abdomen and pelvis was performed using the standard protocol following bolus  administration of intravenous contrast. CONTRAST:  75m OMNIPAQUE IOHEXOL 300 MG/ML  SOLN COMPARISON:  CT 06/26/2016 FINDINGS: Lower chest: There is a small to moderate right pleural effusion with adjacent atelectatic changes in the right lung base. More bandlike areas of opacity in left lung base likely reflect atelectasis and/or scarring. There is cardiomegaly with predominantly right atrial enlargement with notable reflux of contrast into the hepatic veins and IVC. Pacer leads are seen at the cardiac apex and right atrium. No pericardial effusion. Hepatobiliary: Slight heterogeneity of enhancement on delayed phase imaging towards the periphery of the right lobe liver may reflect some features of chronic hepatic congestion/altered perfusion. No concerning focal liver lesion is seen. Smooth liver surface contour. Gallbladder and biliary tree are unremarkable without visible calcified gallstones. Pancreas: Mild pancreatic atrophy. No focal concerning pancreatic lesion. No ductal dilatation or peripancreatic inflammation. Spleen: Normal in size without focal abnormality. Adrenals/Urinary Tract: Normal adrenal glands. Kidneys enhance and excrete symmetrically. Stable appearance of a 2.2 cm fluid attenuation cyst in the posterior lower pole left kidney. A nonobstructing 1.4 cm calculus is seen in the left lower pole as well. No concerning renal lesion, obstructive urolithiasis or hydronephrosis. Urinary bladder is largely decompressed at the time of exam and therefore poorly evaluated by CT imaging. No gross bladder abnormality. Stomach/Bowel: Distal esophagus, stomach and duodenum are unremarkable. No small bowel thickening or dilatation. The appendix is surgically absent. Chronic intramural fat throughout the colon is a nonspecific finding that can be seen both as sequela of prior inflammatory bowel disease or obese body habitus. No significant acute colonic wall thickening or dilatation. No pericolonic stranding or  inflammatory change. Vascular/Lymphatic: Atherosclerotic calcifications throughout the abdominal aorta and branch vessels. No aneurysm or ectasia. Reflux of contrast into the IVC and hepatic veins as above. No enlarged abdominopelvic lymph nodes. Reproductive: Uterus is surgically absent. No concerning adnexal lesions. Other: Diffuse mild  body wall edema as well as some central mesenteric edematous change. No abdominopelvic free air. There is a small volume of low-attenuation free fluid in the deep pelvis of unclear etiology. No bowel containing hernias. Musculoskeletal: There is new anterior wedging compression deformity of the L1 vertebral body with approximately 35% height loss anteriorly (AO spine A1). Additional acute anterior wedging compression deformity of the L3 vertebral body with 15% height loss anteriorly (AO spine A1). No other acute fracture or suspicious osseous lesion is seen. No visible sacral insufficiency fractures. The bones of the pelvis are intact and congruent with arthrosis at the symphysis pubis likely reflecting sequela of prior osteitis pubis. There is levocurvature of the lumbar spine with an apex at the L3 level. Partial ankylosis along the left lateral aspect of the L5-S1 disc space is unchanged from prior. Stable hemangiomata at L4. IMPRESSION: 1. Acute anterior wedging compression deformity of the L1 vertebral body with 35% height loss anteriorly (AO spine A1). 2. Acute anterior wedging compression deformity of the L3 vertebral body with 15% height loss anteriorly (AO spine A1). 3. Features suggestive of anasarca including a small to moderate right pleural effusion, diffuse body wall edema and central mesenteric edematous changes. Small volume of low-attenuation fluid in the deep pelvis is favored to be secondary to this volume third-spacing/fluid overload rather than an occult bowel injury though should correlate with serial abdominal exam findings. 4. No other acute traumatic  findings in the abdomen or pelvis. 5. Cardiomegaly with predominantly right atrial enlargement with notable reflux of contrast into the hepatic veins and IVC, suggestive of underlying right heart failure. Heterogeneous attenuation of the liver particularly in the right lobe may reflect some underlying hepatic congestion/altered perfusion as well. 6. Nonobstructing left nephrolithiasis. 7. Chronic intramural fat throughout the colon is a nonspecific finding that can be seen both as sequela of prior inflammatory bowel disease or obese body habitus. 8. Aortic Atherosclerosis (ICD10-I70.0). Electronically Signed   By: Lovena Le M.D.   On: 06/08/2019 06:26   DG Hip Unilat W or Wo Pelvis 2-3 Views Right  Result Date: 06/08/2019 CLINICAL DATA:  Fall a few days ago, hip pain EXAM: DG HIP (WITH OR WITHOUT PELVIS) 2-3V RIGHT COMPARISON:  None. FINDINGS: The bones of the pelvis are intact and congruent. Mild degenerative changes in the spine and SI joints. Additional arthrosis at the symphysis pubis. Proximal femora are intact and normally located. There is moderate bilateral hip arthrosis. Few phleboliths are noted in the pelvis. Nonspecific air distention of the colon without high-grade bowel obstruction. There is a diffusely edematous appearance of the soft tissues. IMPRESSION: 1. No acute osseous abnormality. 2. Nonspecific air distention of the colon without high-grade bowel obstruction. 3. Question some diffuse mild edematous changes, correlate with fluid status. Electronically Signed   By: Lovena Le M.D.   On: 06/08/2019 04:27     EKG: Not done.  Have ordered 1.  Assessment/Plan Active Problems:   Hypothyroidism   AF (paroxysmal atrial fibrillation) ([facility])   Compression fracture of first lumbar vertebra ([facility]CC)   Closed compression fracture of L3 lumbar vertebra, initial encounter ([facility]CC)    L1 and L3 compression fracture: TLSO brace has been ordered by ED physician.  Will consult PT OT to assess her  after that.  Based on PT assessment, displacement will be planned.  She is pain-free at this point in time.  Tylenol as needed for pain.  Paroxysmal atrial fibrillation: Rates controlled.  Resume home calcium channel blocker, beta-blocker  and Eliquis.  History of diastolic congestive heart failure: Resume home dose of diuretics and potassium.  She is euvolemic at this point in time.  Resolving right upper lobe cavitary infiltrate: Based on all the history where she does not really have respiratory symptoms other than mild shortness of breath.  No cough, no leukocytosis or fever and recent treatment for pneumonia.  This is right upper cavitary infiltrate is likely resolving pneumonia and residual imaging findings.  I do not think this is active or new acute pneumonia and thus will forego antibiotics however monitor closely for hypoxia or fever.  Repeat CBC in the morning.  Acquired hypothyroidism: Resume Synthroid.  CKD stage IIIb: At baseline.  Watch closely.  DVT prophylaxis: Eliquis Code Status: Partial code, no intubation but okay with CPR. Family Communication: None present at bedside.  Plan of care discussed with patient's son in length and he verbalized understanding and agreed with it. Disposition Plan: Likely home in next 1 to 2 days Consults called: None Admission status: After patient   Status is: Observation  The patient remains OBS appropriate and will d/c before 2 midnights.  Dispo: The patient is from: Home              Anticipated d/c is to: Home              Anticipated d/c date is: 1 day              Patient currently is not medically stable to d/c.   Darliss Cheney MD [facility]  06/08/2019, 8:49 AM  To contact the attending provider between 7A-7P or the covering provider during after hours 7P-7A, please log into the web site [email or web address]

## 2019-06-09 DIAGNOSIS — I130 Hypertensive heart and chronic kidney disease with heart failure and stage 1 through stage 4 chronic kidney disease, or unspecified chronic kidney disease: Secondary | ICD-10-CM | POA: Diagnosis not present

## 2019-06-09 DIAGNOSIS — I480 Paroxysmal atrial fibrillation: Secondary | ICD-10-CM | POA: Diagnosis not present

## 2019-06-09 DIAGNOSIS — I4821 Permanent atrial fibrillation: Secondary | ICD-10-CM | POA: Diagnosis not present

## 2019-06-09 DIAGNOSIS — D649 Anemia, unspecified: Secondary | ICD-10-CM | POA: Diagnosis not present

## 2019-06-09 DIAGNOSIS — S32039A Unspecified fracture of third lumbar vertebra, initial encounter for closed fracture: Secondary | ICD-10-CM | POA: Diagnosis not present

## 2019-06-09 DIAGNOSIS — S32030A Wedge compression fracture of third lumbar vertebra, initial encounter for closed fracture: Secondary | ICD-10-CM | POA: Diagnosis not present

## 2019-06-09 DIAGNOSIS — N390 Urinary tract infection, site not specified: Secondary | ICD-10-CM | POA: Diagnosis not present

## 2019-06-09 DIAGNOSIS — Z20822 Contact with and (suspected) exposure to covid-19: Secondary | ICD-10-CM | POA: Diagnosis not present

## 2019-06-09 DIAGNOSIS — S32019A Unspecified fracture of first lumbar vertebra, initial encounter for closed fracture: Secondary | ICD-10-CM | POA: Diagnosis not present

## 2019-06-09 DIAGNOSIS — I340 Nonrheumatic mitral (valve) insufficiency: Secondary | ICD-10-CM | POA: Diagnosis not present

## 2019-06-09 DIAGNOSIS — I5033 Acute on chronic diastolic (congestive) heart failure: Secondary | ICD-10-CM | POA: Diagnosis not present

## 2019-06-09 DIAGNOSIS — E039 Hypothyroidism, unspecified: Secondary | ICD-10-CM | POA: Diagnosis not present

## 2019-06-09 DIAGNOSIS — E785 Hyperlipidemia, unspecified: Secondary | ICD-10-CM | POA: Diagnosis not present

## 2019-06-09 LAB — COMPREHENSIVE METABOLIC PANEL
ALT: 15 U/L (ref 0–44)
AST: 20 U/L (ref 15–41)
Albumin: 2.6 g/dL — ABNORMAL LOW (ref 3.5–5.0)
Alkaline Phosphatase: 70 U/L (ref 38–126)
Anion gap: 7 (ref 5–15)
BUN: 21 mg/dL (ref 8–23)
CO2: 24 mmol/L (ref 22–32)
Calcium: 8.2 mg/dL — ABNORMAL LOW (ref 8.9–10.3)
Chloride: 105 mmol/L (ref 98–111)
Creatinine, Ser: 0.98 mg/dL (ref 0.44–1.00)
GFR calc Af Amer: >60 mL/min (ref >60)
GFR calc non Af Amer: 53 mL/min — ABNORMAL LOW (ref >60)
Glucose, Bld: 86 mg/dL (ref 70–99)
Potassium: 3.9 mmol/L (ref 3.5–5.1)
Sodium: 136 mmol/L (ref 135–145)
Total Bilirubin: 1.7 mg/dL — ABNORMAL HIGH (ref 0.3–1.2)
Total Protein: 5.8 g/dL — ABNORMAL LOW (ref 6.5–8.1)

## 2019-06-09 LAB — CBC
HCT: 28.5 % — ABNORMAL LOW (ref 36.0–46.0)
Hemoglobin: 8.9 g/dL — ABNORMAL LOW (ref 12.0–15.0)
MCH: 29.4 pg (ref 26.0–34.0)
MCHC: 31.2 g/dL (ref 30.0–36.0)
MCV: 94.1 fL (ref 80.0–100.0)
Platelets: 126 10*3/uL — ABNORMAL LOW (ref 150–400)
RBC: 3.03 MIL/uL — ABNORMAL LOW (ref 3.87–5.11)
RDW: 28.5 % — ABNORMAL HIGH (ref 11.5–15.5)
WBC: 3.7 10*3/uL — ABNORMAL LOW (ref 4.0–10.5)
nRBC: 1.1 % — ABNORMAL HIGH (ref 0.0–0.2)

## 2019-06-09 LAB — ABO/RH: ABO/RH(D): A POS

## 2019-06-09 NOTE — Plan of Care (Signed)
  Problem: Education: Goal: Knowledge of General Education information will improve Description Including pain rating scale, medication(s)/side effects and non-pharmacologic comfort measures Outcome: Progressing   Problem: Nutrition: Goal: Adequate nutrition will be maintained Outcome: Progressing   Problem: Pain Managment: Goal: General experience of comfort will improve Outcome: Progressing   

## 2019-06-09 NOTE — Progress Notes (Signed)
[facility]  PROGRESS NOTE  Nancy Meadows JIR:[id number] DOB: [birth year] DOA: 06/08/2019 PCP: Merrilee Seashore, MD Admit date - 06/08/2019   Admitting Physician Darliss Cheney, MD  Outpatient Primary MD for the patient is Merrilee Seashore, MD  LOS - 0 Brief Narrative   Nancy Meadows is a 86 y.o. year old female with medical history significant for CKD stage III, hypothyroidism, sick sinus syndrome status post pacemaker (05/2013) CHF with preserved EF, HLD, A. fib/flutter on Eliquis and severe MR who presented on 06/08/2019 with worsening right-sided belly and lower back pain after a fall about a week ago after returning from her stay at a rehab facility after recent .    In the ED, patient was afebrile, hemodynamically stable a, Covid test negative, lab work notable for BUN 31, creatinine 1.27, WBC 3.7, hemoglobin 8.9, platelets 126, lipase 56, BNP 660.  Chest x-ray showed cardiac enlargement without heart failure and interval development of extensive right upper lobe infiltrate with possible cavitation and probable pneumonia.  CT abdomen showed acute compression fracture of L1 and L3 vertebra, and possible anasarca with diffuse body wall edema, as well as cardiomegaly with right atrial enlargement and sign suggestive of underlying right heart failure with hepatic congestion.  Patient has severe mitral regurgitation.  Underwent left and right heart cath at [facility] on 05/11/2019.  Ultimate plan to pursue MitraClip  Patient was admitted from 05/14/2019-05/21/2019 at [facility] for acute hypoxic respiratory failure with septic shock secondary to community-acquired lobar pneumonia of right upper lobe (negative for strep pneumo, Legionella, Covid, mycoplasma at the time) complicated by A. fib with RVR and hospital-acquired delirium found to have right middle lobe nodule 5 mm consistent with cavitation   Subjective  Today she states she has not had any right lower sided belly pain since  being in the hospital.  Has mild cough no fevers or chills.  Denies any shortness of breath.  No palpitations or chest pain.  A & P    Extensive right upper lobe infiltrate with possible cavitation likely sequelae from recent hospitalization for right lobar pneumonia with cavitation .  Noted concern for potential pneumonia on admitting chest x-ray, but was recently treated for bacterial pneumonia at [facility] with R lobar consolidation and cavitation and RML nodule noted at that time ( 4/30-5/7).  Currently normal respiratory effort on room air without cough,and no fever, procalcitonin less than 0.10 culture remarkable.    -Needs repeat CT chest in 6 to 8 weeks to reevaluate RML nodule -Given afebrile, normal respiratory status, wnl procalcitonin would not start antibiotics doubt active infection -Home desaturation testing, ambulatory O2 -Encourage incentive spirometry  Hypotension with mild lactic acidosis, stable.  Occurred overnight with SBP in 80s and lactic acid of 2.8 with resolution of lactic acidosis after IV fluids.  Orthostatic vitals within normal range currently holding home BP meds. Worried this could be from decreased CO from her chronic severe MR with right sided heart failure.  Blood pressure better but this is after receiving maintenance fluids up until this morning discontinuation of patient's home BP meds -status post timed IV fluids -Currently holding home Toprol, Lasix -Cardiology consulted worried this could be from decreased cardiac output from her chronic severe mitral regurgitation -continue to monitor blood pressure  Severe mitral valve regurgitation with concern for Right sided heart failure with chronic pulmonary hypertension and chronic CHF with preserved EF.  TTE (04/23/2019 with preserved EF 50-55%, moderate to severe TR, moderate pulmonary HTN with RVSP  58), TEE (04/26/2019) with moderate TR, severe MR .  Has some noted body wall edema/anasarca on CT abdomen with right  atria enlargement and contrast in hepatic veins and possible hepatic congestion. Scant b/l Ankle edema on physical exam with no worsening dyspnea or palpitations or arrhythmias -Undergoing outpatient work-up with cardiology for planned mitral clip -Cardiology consulted -Daily weights, strict I's and O's 1 -currently holding home Lasix given previous hypotension, may warrant re initiating now that BP better given volume status will defer to cardiology  Acute L3/L1 vertebrae compression fractures.  After a fall. -PT/OT evaluation -TLSO brace in place   HTN.   did have episodes of hypotension with SBP in the 80s overnight -Holding home Lasix, Toprol  Afib/a flutter, currently rate controlled -Continue home Eliquis -Holding home Toprol, due to recent hypotensive episodes -Diltiazem was been discontinued during previous hospital stay at [facility]  Hypothyroidism, stable -Continue home Synthroid  Normocytic anemia, chronic.  Hemoglobin stable at 8.9-9 since admission with no signs of symptoms of bleeding. -Continue home iron  CKD stage III, stable at baseline creatinine 1.1-1.2.-Avoid nephrotoxins -Monitor output, repeat BMP    Family Communication  : Updated son Jacelyn Grip on the phone  Code Status : Partial Disposition Plan  :  Patient is from home. Anticipated d/c date:  24 to 48 hours. Barriers to d/c or necessity for inpatient status:  Close monitoring of blood pressure given severe mitral regurgitation with possible right-sided heart failure that required maintenance IV fluids and current discontinuation of home BP meds, cardiology consulted to assist in medications for presumed severe MR with right-sided heart failure Consults  : Cardiology  Procedures  : None  DVT Prophylaxis  : Eliquis  Lab Results  Component Value Date   PLT 126 (L) 06/09/2019    Diet :  Diet Order            Diet 2 gram sodium Room service appropriate? Yes; Fluid consistency: Thin  Diet  effective now               Inpatient Medications Scheduled Meds: . apixaban  5 mg Oral BID  . ferrous sulfate  325 mg Oral Daily  . levothyroxine  88 mcg Oral Q0600  . potassium chloride  10 mEq Oral Daily  . sodium chloride flush  3 mL Intravenous Q12H   Continuous Infusions: PRN Meds:.acetaminophen **OR** acetaminophen, ondansetron **OR** ondansetron (ZOFRAN) IV  Antibiotics  :   Anti-infectives (From admission, onward)   Start     Dose/Rate Route Frequency Ordered Stop   06/08/19 0745  cephALEXin (KEFLEX) capsule 250 mg     250 mg Oral  Once 06/08/19 0740 06/08/19 0857       Objective   Vitals:   06/09/19 0146 06/09/19 0526 06/09/19 1020 06/09/19 1341  BP: 111/78 113/79 100/65 119/90  Pulse: 85 73  78  Resp: _0 Temp: 98.5 F (36.9 C) 97.7 F (36.5 C)  98.7 F (37.1 C)  TempSrc:    Oral  SpO2: 100% 90% 95% 100%  Weight:      Height:        SpO2: 100 % O2 Flow Rate (L/min): 2 L/min  Wt Readings from Last 3 Encounters:  06/08/19 59.9 kg  04/16/19 63.7 kg  03/08/19 63 kg     Intake/Output Summary (Last 24 hours) at 06/09/2019 1451 Last data filed at 06/09/2019 1353 Gross per 24 hour  Intake 705.4 ml  Output 1000 ml  Net -  294.6 ml    Physical Exam:  Awake Alert, Oriented X 3, Normal affect No new F.N deficits,  [place].AT, Normal respiratory effort on room air, CTAB Irregular rhythm, normal rate, systolic murmur appreciated, scant bilateral pitting edema of ankles  Abdomen soft, nondistended, nontender, normal bowel sounds +ve B.Sounds, Abd Soft, No tenderness, No rebound, guarding or rigidity. No Cyanosis, No new Rash or bruise     I have personally reviewed the following:   Data Reviewed:  CBC Recent Labs  Lab 06/08/19 0424 06/08/19 2049 06/09/19 0307  WBC 3.7* 3.6* 3.7*  HGB 8.9* 9.0* 8.9*  HCT 27.9* 28.8* 28.5*  PLT 126* 132* 126*  MCV 93.3 95.4 94.1  MCH 29.8 29.8 29.4  MCHC 31.9 31.3 31.2  RDW 28.1* 28.6* 28.5*    LYMPHSABS 1.3  --   --   MONOABS 0.3  --   --   EOSABS 0.0  --   --   BASOSABS 0.0  --   --     Chemistries  Recent Labs  Lab 06/08/19 0424 06/08/19 0956 06/09/19 0307  NA 135  --  136  K 3.7  --  3.9  CL 101  --  105  CO2 26  --  24  GLUCOSE 107*  --  86  BUN 31*  --  21  CREATININE 1.27*  --  0.98  CALCIUM 8.7*  --  8.2*  MG  --  1.8  --   AST 21  --  20  ALT 15  --  15  ALKPHOS 68  --  70  BILITOT 1.8*  --  1.7*   ------------------------------------------------------------------------------------------------------------------ No results for input(s): CHOL, HDL, LDLCALC, TRIG, CHOLHDL, LDLDIRECT in the last 72 hours.  No results found for: HGBA1C ------------------------------------------------------------------------------------------------------------------ Recent Labs    06/08/19 0956  TSH 2.818   ------------------------------------------------------------------------------------------------------------------ No results for input(s): VITAMINB12, FOLATE, FERRITIN, TIBC, IRON, RETICCTPCT in the last 72 hours.  Coagulation profile No results for input(s): INR, PROTIME in the last 168 hours.  No results for input(s): DDIMER in the last 72 hours.  Cardiac Enzymes No results for input(s): CKMB, TROPONINI, MYOGLOBIN in the last 168 hours.  Invalid input(s): CK ------------------------------------------------------------------------------------------------------------------    Component Value Date/Time   BNP 660.6 (H) 06/08/2019 0424    Micro Results Recent Results (from the past 240 hour(s))  Urine culture     Status: None (Preliminary result)   Collection Time: 06/08/19  5:33 AM   Specimen: Urine, Catheterized  Result Value Ref Range Status   Specimen Description   Final    URINE, CATHETERIZED Performed at [facility], [place] [address], [place], [place] [address]    Special Requests   Final    Normal Performed at [facility], [place] [address] [place], [place] [address]    Culture   Final    CULTURE REINCUBATED FOR BETTER GROWTH Performed at [facility], [place] [address], [place], [place] [address]    Report Status PENDING  Incomplete  SARS Coronavirus 2 by RT PCR (hospital order, performed in [facility] hospital lab) Nasopharyngeal Nasopharyngeal Swab     Status: None   Collection Time: 06/08/19  9:12 AM   Specimen: Nasopharyngeal Swab  Result Value Ref Range Status   SARS Coronavirus 2 NEGATIVE NEGATIVE Final    Comment: (NOTE) SARS-CoV-2 target nucleic acids are NOT DETECTED. The SARS-CoV-2 RNA is generally detectable in upper and lower respiratory specimens during the acute phase of infection.  The lowest concentration of SARS-CoV-2 viral copies this assay can detect is 250 copies / mL. A negative result does not preclude SARS-CoV-2 infection and should not be used as the sole basis for treatment or other patient management decisions.  A negative result may occur with improper specimen collection / handling, submission of specimen other than nasopharyngeal swab, presence of viral mutation(s) within the areas targeted by this assay, and inadequate number of viral copies (<250 copies / mL). A negative result must be combined with clinical observations, patient history, and epidemiological information. Fact Sheet for Patients:   StrictlyIdeas.no Fact Sheet for Healthcare Providers: BankingDealers.co.za This test is not yet approved or cleared  by the [place] FDA and has been authorized for detection and/or diagnosis of SARS-CoV-2 by FDA under an Emergency Use Authorization (EUA).  This EUA will remain in effect (meaning this test can be used) for the duration of the COVID-19 declaration under Section 564(b)(1) of the Act, 21 U.S.C. section 360bbb-3(b)(1), unless the authorization is terminated or revoked sooner. Performed at  [facility], [place] [address] [place], [place] [address]   Expectorated sputum assessment w rflx to resp cult     Status: None   Collection Time: 06/08/19  2:35 PM   Specimen: Expectorated Sputum  Result Value Ref Range Status   Specimen Description EXPECTORATED SPUTUM  Final   Special Requests NONE  Final   Sputum evaluation   Final    THIS SPECIMEN IS ACCEPTABLE FOR SPUTUM CULTURE Performed at [facility], [place] [address], [place], [place] [address]    Report Status 06/08/2019 FINAL  Final  Culture, respiratory     Status: None (Preliminary result)   Collection Time: 06/08/19  2:35 PM  Result Value Ref Range Status   Specimen Description   Final    EXPECTORATED SPUTUM Performed at [place] [address] [place], [place] [address]    Special Requests   Final    NONE Reflexed from [id number] Performed at [place] [address], [place], [place] [address]    Gram Stain   Final    RARE WBC PRESENT, PREDOMINANTLY PMN RARE GRAM POSITIVE COCCI RARE GRAM POSITIVE RODS    Culture   Final    CULTURE REINCUBATED FOR BETTER GROWTH Performed at [facility], [place] [address], [place], [place] [address]    Report Status PENDING  Incomplete    Radiology Reports DG Chest 2 View  Result Date: 06/08/2019 CLINICAL DATA:  Short of breath and weakness.  Question CHF EXAM: CHEST - 2 VIEW COMPARISON:  04/05/2019.  CT abdomen pelvis 06/08/2019 FINDINGS: Cardiac enlargement. Dual lead pacemaker in good position and unchanged. Negative for congestive heart failure. No edema or effusion Interval development of right upper lobe infiltrates with possible cavitation. Probable pneumonia. No acute skeletal abnormality. IMPRESSION: Cardiac enlargement without heart failure Interval development of extensive right upper lobe infiltrate with probable cavitation. Probable pneumonia. Electronically Signed   By: Franchot Gallo M.D.   On: 06/08/2019 08:05   CT Abdomen Pelvis W Contrast  Result Date: 06/08/2019 CLINICAL DATA:  Abdominal distension, fall 3 days prior, right hip and lower back pain, sacrococcygeal tenderness as well. EXAM: CT ABDOMEN AND PELVIS WITH CONTRAST TECHNIQUE: Multidetector CT imaging of the abdomen and pelvis was performed using the standard protocol following bolus administration of intravenous contrast. CONTRAST:  45m OMNIPAQUE IOHEXOL 300 MG/ML  SOLN COMPARISON:  CT 06/26/2016 FINDINGS: Lower chest: There  is a small to moderate right pleural effusion with adjacent atelectatic changes in the right lung base. More bandlike areas of opacity in left lung base likely reflect atelectasis and/or scarring. There is cardiomegaly with predominantly right atrial enlargement with notable reflux of contrast into the hepatic veins and IVC. Pacer leads are seen at the cardiac apex and right atrium. No pericardial effusion. Hepatobiliary: Slight heterogeneity of enhancement on delayed phase imaging towards the periphery of the right lobe liver may reflect some features of chronic hepatic congestion/altered perfusion. No concerning focal liver lesion is seen. Smooth liver surface contour. Gallbladder and biliary tree are unremarkable without visible calcified gallstones. Pancreas: Mild pancreatic atrophy. No focal concerning pancreatic lesion. No ductal dilatation or peripancreatic inflammation. Spleen: Normal in size without focal abnormality. Adrenals/Urinary Tract: Normal adrenal glands. Kidneys enhance and excrete symmetrically. Stable appearance of a 2.2 cm fluid attenuation cyst in the posterior lower pole left kidney. A nonobstructing 1.4 cm calculus is seen in the left lower pole as well. No concerning renal lesion, obstructive urolithiasis or hydronephrosis. Urinary bladder is largely decompressed at the time of exam and therefore poorly evaluated by CT imaging. No gross bladder abnormality. Stomach/Bowel:  Distal esophagus, stomach and duodenum are unremarkable. No small bowel thickening or dilatation. The appendix is surgically absent. Chronic intramural fat throughout the colon is a nonspecific finding that can be seen both as sequela of prior inflammatory bowel disease or obese body habitus. No significant acute colonic wall thickening or dilatation. No pericolonic stranding or inflammatory change. Vascular/Lymphatic: Atherosclerotic calcifications throughout the abdominal aorta and branch vessels. No aneurysm or ectasia. Reflux of contrast into the IVC and hepatic veins as above. No enlarged abdominopelvic lymph nodes. Reproductive: Uterus is surgically absent. No concerning adnexal lesions. Other: Diffuse mild body wall edema as well as some central mesenteric edematous change. No abdominopelvic free air. There is a small volume of low-attenuation free fluid in the deep pelvis of unclear etiology. No bowel containing hernias. Musculoskeletal: There is new anterior wedging compression deformity of the L1 vertebral body with approximately 35% height loss anteriorly (AO spine A1). Additional acute anterior wedging compression deformity of the L3 vertebral body with 15% height loss anteriorly (AO spine A1). No other acute fracture or suspicious osseous lesion is seen. No visible sacral insufficiency fractures. The bones of the pelvis are intact and congruent with arthrosis at the symphysis pubis likely reflecting sequela of prior osteitis pubis. There is levocurvature of the lumbar spine with an apex at the L3 level. Partial ankylosis along the left lateral aspect of the L5-S1 disc space is unchanged from prior. Stable hemangiomata at L4. IMPRESSION: 1. Acute anterior wedging compression deformity of the L1 vertebral body with 35% height loss anteriorly (AO spine A1). 2. Acute anterior wedging compression deformity of the L3 vertebral body with 15% height loss anteriorly (AO spine A1). 3. Features suggestive of  anasarca including a small to moderate right pleural effusion, diffuse body wall edema and central mesenteric edematous changes. Small volume of low-attenuation fluid in the deep pelvis is favored to be secondary to this volume third-spacing/fluid overload rather than an occult bowel injury though should correlate with serial abdominal exam findings. 4. No other acute traumatic findings in the abdomen or pelvis. 5. Cardiomegaly with predominantly right atrial enlargement with notable reflux of contrast into the hepatic veins and IVC, suggestive of underlying right heart failure. Heterogeneous attenuation of the liver particularly in the right lobe may reflect some underlying hepatic congestion/altered perfusion as well.  6. Nonobstructing left nephrolithiasis. 7. Chronic intramural fat throughout the colon is a nonspecific finding that can be seen both as sequela of prior inflammatory bowel disease or obese body habitus. 8. Aortic Atherosclerosis (ICD10-I70.0). Electronically Signed   By: Lovena Le M.D.   On: 06/08/2019 06:26   DG Hip Unilat W or Wo Pelvis 2-3 Views Right  Result Date: 06/08/2019 CLINICAL DATA:  Fall a few days ago, hip pain EXAM: DG HIP (WITH OR WITHOUT PELVIS) 2-3V RIGHT COMPARISON:  None. FINDINGS: The bones of the pelvis are intact and congruent. Mild degenerative changes in the spine and SI joints. Additional arthrosis at the symphysis pubis. Proximal femora are intact and normally located. There is moderate bilateral hip arthrosis. Few phleboliths are noted in the pelvis. Nonspecific air distention of the colon without high-grade bowel obstruction. There is a diffusely edematous appearance of the soft tissues. IMPRESSION: 1. No acute osseous abnormality. 2. Nonspecific air distention of the colon without high-grade bowel obstruction. 3. Question some diffuse mild edematous changes, correlate with fluid status. Electronically Signed   By: Lovena Le M.D.   On: 06/08/2019 04:27      Time Spent in minutes  30     Desiree Hane M.D on 06/09/2019 at 2:51 PM  To page go to [email or web address] - password [facility]

## 2019-06-09 NOTE — Progress Notes (Signed)
SATURATION QUALIFICATIONS:   Patient Saturations on Room Air at Rest = 100%  Patient Saturations on Room Air while Ambulating = 94-98%  Patient ambulated 75 feet with walker, declined to go any farther. Oxygen saturation never below 94%.

## 2019-06-09 NOTE — Progress Notes (Signed)
   06/09/19 1200  Clinical Encounter Type  Visited With Patient  Visit Type Initial;Psychological support;Spiritual support  Referral From Nurse  Consult/Referral To Chaplain  Spiritual Encounters  Spiritual Needs Emotional;Other (Comment) (Advance Directive )  Stress Factors  Patient Stress Factors Health changes;Other (Comment)    I visited with Nancy Meadows per spiritual care consult. It was to discuss an Forensic scientist. Mrs. Gentzler stated that she may already have one from when her husband created his. I provided her the paperwork.  I provided spiritual support as well.   Please, contact Spiritual Care for further assistance.   Chaplain Shanon Ace M.Div., [facility]

## 2019-06-10 DIAGNOSIS — E039 Hypothyroidism, unspecified: Secondary | ICD-10-CM | POA: Diagnosis not present

## 2019-06-10 DIAGNOSIS — W19XXXA Unspecified fall, initial encounter: Secondary | ICD-10-CM | POA: Diagnosis not present

## 2019-06-10 DIAGNOSIS — I480 Paroxysmal atrial fibrillation: Secondary | ICD-10-CM

## 2019-06-10 DIAGNOSIS — S32019A Unspecified fracture of first lumbar vertebra, initial encounter for closed fracture: Secondary | ICD-10-CM | POA: Diagnosis not present

## 2019-06-10 DIAGNOSIS — S32030A Wedge compression fracture of third lumbar vertebra, initial encounter for closed fracture: Secondary | ICD-10-CM | POA: Diagnosis not present

## 2019-06-10 DIAGNOSIS — Z961 Presence of intraocular lens: Secondary | ICD-10-CM | POA: Diagnosis present

## 2019-06-10 DIAGNOSIS — I5033 Acute on chronic diastolic (congestive) heart failure: Secondary | ICD-10-CM | POA: Diagnosis present

## 2019-06-10 DIAGNOSIS — Z8701 Personal history of pneumonia (recurrent): Secondary | ICD-10-CM | POA: Diagnosis not present

## 2019-06-10 DIAGNOSIS — I130 Hypertensive heart and chronic kidney disease with heart failure and stage 1 through stage 4 chronic kidney disease, or unspecified chronic kidney disease: Secondary | ICD-10-CM | POA: Diagnosis not present

## 2019-06-10 DIAGNOSIS — Z7901 Long term (current) use of anticoagulants: Secondary | ICD-10-CM | POA: Diagnosis not present

## 2019-06-10 DIAGNOSIS — I5031 Acute diastolic (congestive) heart failure: Secondary | ICD-10-CM

## 2019-06-10 DIAGNOSIS — I4821 Permanent atrial fibrillation: Secondary | ICD-10-CM | POA: Diagnosis not present

## 2019-06-10 DIAGNOSIS — Z823 Family history of stroke: Secondary | ICD-10-CM | POA: Diagnosis not present

## 2019-06-10 DIAGNOSIS — I340 Nonrheumatic mitral (valve) insufficiency: Secondary | ICD-10-CM

## 2019-06-10 DIAGNOSIS — Z79890 Hormone replacement therapy (postmenopausal): Secondary | ICD-10-CM | POA: Diagnosis not present

## 2019-06-10 DIAGNOSIS — W010XXA Fall on same level from slipping, tripping and stumbling without subsequent striking against object, initial encounter: Secondary | ICD-10-CM | POA: Diagnosis present

## 2019-06-10 DIAGNOSIS — N1832 Chronic kidney disease, stage 3b: Secondary | ICD-10-CM | POA: Diagnosis present

## 2019-06-10 DIAGNOSIS — I081 Rheumatic disorders of both mitral and tricuspid valves: Secondary | ICD-10-CM | POA: Diagnosis present

## 2019-06-10 DIAGNOSIS — W19XXXD Unspecified fall, subsequent encounter: Secondary | ICD-10-CM | POA: Diagnosis not present

## 2019-06-10 DIAGNOSIS — Z9842 Cataract extraction status, left eye: Secondary | ICD-10-CM | POA: Diagnosis not present

## 2019-06-10 DIAGNOSIS — S32010A Wedge compression fracture of first lumbar vertebra, initial encounter for closed fracture: Secondary | ICD-10-CM | POA: Diagnosis not present

## 2019-06-10 DIAGNOSIS — Z79899 Other long term (current) drug therapy: Secondary | ICD-10-CM | POA: Diagnosis not present

## 2019-06-10 DIAGNOSIS — J189 Pneumonia, unspecified organism: Secondary | ICD-10-CM | POA: Diagnosis present

## 2019-06-10 DIAGNOSIS — Z9841 Cataract extraction status, right eye: Secondary | ICD-10-CM | POA: Diagnosis not present

## 2019-06-10 DIAGNOSIS — Z8249 Family history of ischemic heart disease and other diseases of the circulatory system: Secondary | ICD-10-CM | POA: Diagnosis not present

## 2019-06-10 DIAGNOSIS — N390 Urinary tract infection, site not specified: Secondary | ICD-10-CM | POA: Diagnosis not present

## 2019-06-10 DIAGNOSIS — I495 Sick sinus syndrome: Secondary | ICD-10-CM | POA: Diagnosis present

## 2019-06-10 DIAGNOSIS — Z20822 Contact with and (suspected) exposure to covid-19: Secondary | ICD-10-CM | POA: Diagnosis not present

## 2019-06-10 DIAGNOSIS — E785 Hyperlipidemia, unspecified: Secondary | ICD-10-CM | POA: Diagnosis present

## 2019-06-10 DIAGNOSIS — D649 Anemia, unspecified: Secondary | ICD-10-CM | POA: Diagnosis not present

## 2019-06-10 DIAGNOSIS — Z888 Allergy status to other drugs, medicaments and biological substances status: Secondary | ICD-10-CM | POA: Diagnosis not present

## 2019-06-10 DIAGNOSIS — S32039A Unspecified fracture of third lumbar vertebra, initial encounter for closed fracture: Secondary | ICD-10-CM | POA: Diagnosis not present

## 2019-06-10 DIAGNOSIS — Z950 Presence of cardiac pacemaker: Secondary | ICD-10-CM | POA: Diagnosis not present

## 2019-06-10 LAB — CBC
HCT: 31.3 % — ABNORMAL LOW (ref 36.0–46.0)
Hemoglobin: 9.8 g/dL — ABNORMAL LOW (ref 12.0–15.0)
MCH: 30.2 pg (ref 26.0–34.0)
MCHC: 31.3 g/dL (ref 30.0–36.0)
MCV: 96.6 fL (ref 80.0–100.0)
Platelets: 153 10*3/uL (ref 150–400)
RBC: 3.24 MIL/uL — ABNORMAL LOW (ref 3.87–5.11)
RDW: 29.4 % — ABNORMAL HIGH (ref 11.5–15.5)
WBC: 3.4 10*3/uL — ABNORMAL LOW (ref 4.0–10.5)
nRBC: 0.6 % — ABNORMAL HIGH (ref 0.0–0.2)

## 2019-06-10 LAB — LEGIONELLA PNEUMOPHILA SEROGP 1 UR AG: L. pneumophila Serogp 1 Ur Ag: NEGATIVE

## 2019-06-10 LAB — BASIC METABOLIC PANEL
Anion gap: 8 (ref 5–15)
BUN: 14 mg/dL (ref 8–23)
CO2: 25 mmol/L (ref 22–32)
Calcium: 8.9 mg/dL (ref 8.9–10.3)
Chloride: 107 mmol/L (ref 98–111)
Creatinine, Ser: 0.96 mg/dL (ref 0.44–1.00)
GFR calc Af Amer: >60 mL/min (ref >60)
GFR calc non Af Amer: 54 mL/min — ABNORMAL LOW (ref >60)
Glucose, Bld: 101 mg/dL — ABNORMAL HIGH (ref 70–99)
Potassium: 4.1 mmol/L (ref 3.5–5.1)
Sodium: 140 mmol/L (ref 135–145)

## 2019-06-10 LAB — URINE CULTURE: Special Requests: NORMAL

## 2019-06-10 MED ORDER — FUROSEMIDE 10 MG/ML IJ SOLN
40.0000 mg | Freq: Once | INTRAMUSCULAR | Status: AC
  Administered 2019-06-10: 40 mg via INTRAVENOUS
  Filled 2019-06-10: qty 4

## 2019-06-10 MED ORDER — FUROSEMIDE 20 MG PO TABS: 20.0000 mg | ORAL_TABLET | ORAL | Status: DC

## 2019-06-10 NOTE — Progress Notes (Signed)
Physical Therapy Evaluation Patient Details Name: Nancy Meadows MRN: [id number] DOB: Mar 31, 1932 Today's Date: 06/10/2019   History of Present Illness  86 y.o. year old female with medical history significant for CKD stage III, hypothyroidism, sick sinus syndrome status post pacemaker (05/2013) CHF with preserved EF, HLD, A. fib/flutter on Eliquis and severe MR who presented on 06/08/2019 with worsening right-sided belly and lower back pain after a fall about a week ago after returning from her stay at a rehab facility after recent   Clinical Impression  Pt admitted as above and presenting with functional mobility limitations 2* generalized weakness and ambulatory balance deficits.  Pt would benefit from HHPT to further address deficits in home.  Pt states her son will assist her but will not be present 24/7.    Follow Up Recommendations Home health PT    Equipment Recommendations  None recommended by PT    Recommendations for Other Services       Precautions / Restrictions Precautions Precautions: Fall;Back Precaution Booklet Issued: No Required Braces or Orthoses: Spinal Brace Spinal Brace: Thoracolumbosacral orthotic;Applied in sitting position Spinal Brace Comments: for OOB Restrictions Weight Bearing Restrictions: No      Mobility  Bed Mobility Overal bed mobility: Modified Independent             General bed mobility comments: Pt insistent on using bedrail   Transfers Overall transfer level: Needs assistance Equipment used: Rolling walker (2 wheeled) Transfers: Sit to/from Stand Sit to Stand: Supervision;Min guard         General transfer comment: cues for hand placement; steady assist  Ambulation/Gait Ambulation/Gait assistance: Min guard Gait Distance (Feet): 125 Feet Assistive device: Rolling walker (2 wheeled) Gait Pattern/deviations: Step-through pattern;Decreased step length - right;Decreased step length - left;Shuffle;Trunk flexed Gait velocity:  decr   General Gait Details: cues for posture and position from RW; mild general instability   Stairs            Wheelchair Mobility    Modified Rankin (Stroke Patients Only)       Balance Overall balance assessment: Needs assistance Sitting-balance support: No upper extremity supported;Feet supported Sitting balance-Leahy Scale: Good     Standing balance support: Bilateral upper extremity supported;During functional activity Standing balance-Leahy Scale: Fair                               Pertinent Vitals/Pain Pain Assessment: No/denies pain    Home Living Family/patient expects to be discharged to:: Private residence Living Arrangements: Alone Available Help at Discharge: Family Type of Home: Apartment Home Access: Level entry     Home Layout: One level Home Equipment: Environmental consultant - 4 wheels;Walker - 2 wheels;Wheelchair - manual;Grab bars - toilet;Grab bars - tub/shower;Tub bench Additional Comments: pt reports moved into senior apartment that is handicap accessible and is close to her son    Prior Function Level of Independence: Independent with assistive device(s)         Comments: reports I with cooking, laundry and driving     Hand Dominance   Dominant Hand: Right    Extremity/Trunk Assessment   Upper Extremity Assessment Upper Extremity Assessment: Overall WFL for tasks assessed;Generalized weakness    Lower Extremity Assessment Lower Extremity Assessment: Generalized weakness;Overall WFL for tasks assessed       Communication   Communication: No difficulties  Cognition Arousal/Alertness: Awake/alert Behavior During Therapy: WFL for tasks assessed/performed Overall Cognitive Status: Within Functional Limits for tasks  assessed                                 General Comments: min cues for safety      General Comments      Exercises     Assessment/Plan    PT Assessment Patient needs continued PT services   PT Problem List Decreased activity tolerance;Decreased balance;Decreased mobility;Decreased knowledge of use of DME       PT Treatment Interventions DME instruction;Gait training;Functional mobility training;Therapeutic activities;Therapeutic exercise;Patient/family education    PT Goals (Current goals can be found in the Care Plan section)  Acute Rehab PT Goals Patient Stated Goal: go home PT Goal Formulation: With patient Time For Goal Achievement: 06/24/19 Potential to Achieve Goals: Good    Frequency Min 3X/week   Barriers to discharge Decreased caregiver support Pt states she does not have 24/7 assist    Co-evaluation               AM-PAC PT "6 Clicks" Mobility  Outcome Measure Help needed turning from your back to your side while in a flat bed without using bedrails?: None Help needed moving from lying on your back to sitting on the side of a flat bed without using bedrails?: A Little Help needed moving to and from a bed to a chair (including a wheelchair)?: A Little Help needed standing up from a chair using your arms (e.g., wheelchair or bedside chair)?: A Little Help needed to walk in hospital room?: A Little Help needed climbing 3-5 steps with a railing? : A Little 6 Click Score: 19    End of Session Equipment Utilized During Treatment: Other (comment)(TLSO) Activity Tolerance: Patient tolerated treatment well Patient left: in chair;with call bell/phone within reach Nurse Communication: Mobility status PT Visit Diagnosis: Unsteadiness on feet (R26.81);Difficulty in walking, not elsewhere classified (R26.2)    Time: 4888-9169 PT Time Calculation (min) (ACUTE ONLY): 23 min   Charges:   PT Evaluation $PT Eval Low Complexity: 1 Low          [place] [facility] Pager [phone] Office [phone]   Travian Kerner 06/10/2019, 4:19 PM

## 2019-06-10 NOTE — TOC Initial Note (Addendum)
Transition of Care [facility]) - Initial/Assessment Note    Patient Details  Name: Nancy Meadows MRN: [id number] Date of Birth: 09/27/1932  [facility] CM/SW Contact:    Lia Hopping, [place] Phone Number: 06/10/2019, 4:37 PM  Clinical Narrative:                 CSW met with the patient at bedside/ spoke with patient son by phone to discuss plan of care for home. Patient lives in an [place] senior living community. Patient son reports he visits the patient daily and his home and job are 2-3 minutes away from her home. Son reports the patient was recently at [facility] for a procedure and discharged to [facility] for short rehab. The patient discharged home with HHPT/OT/RN. The patient is active with [facility] care. CSW reached out to the agency and confirm with Jiles Harold and faxed resumption orders to [phone].  Patient has a RW.  No other needs identified.    Expected Discharge Plan: [place] Barriers to Discharge: No Barriers Identified   Patient Goals and CMS Choice     Choice offered to / list presented to : (Active w/ [facility])  Expected Discharge Plan and Services Expected Discharge Plan: [place] In-house Referral: Clinical Social Work Discharge Planning Services: CM Consult   Living arrangements for the past 2 months: [place]                 DME Arranged: N/A DME Agency: NA       HH Arranged: PT, OT [place] Agency: [place] Date [place]: 06/10/19 Time [place]: [id number] Representative spoke with at [place]: Jiles Harold  Prior Living Arrangements/Services Living arrangements for the past 2 months: [place] Lives with:: Self Patient language and need for interpreter reviewed:: No        Need for Family Participation in Patient Care: Yes (Comment) Care giver support system in place?: Yes  (comment) Current home services: DME Criminal Activity/Legal Involvement Pertinent to Current Situation/Hospitalization: No - Comment as needed  Activities of Daily Living Home Assistive Devices/Equipment: Wheelchair, Environmental consultant (specify type) ADL Screening (condition at time of admission) Patient's cognitive ability adequate to safely complete daily activities?: Yes Is the patient deaf or have difficulty hearing?: Yes Does the patient have difficulty seeing, even when wearing glasses/contacts?: No Does the patient have difficulty concentrating, remembering, or making decisions?: No Patient able to express need for assistance with ADLs?: Yes Does the patient have difficulty dressing or bathing?: No Independently performs ADLs?: Yes (appropriate for developmental age) Does the patient have difficulty walking or climbing stairs?: Yes Weakness of Legs: Both Weakness of Arms/Hands: None  Permission Sought/Granted Permission sought to share information with : Case Manager, Customer service manager, Family Supports Permission granted to share information with : Yes, Verbal Permission Granted  Share Information with NAME: Marine scientist granted to share info w AGENCY: [place] granted to share info w Relationship: Son  Permission granted to share info w Contact Information: [phone]  Emotional Assessment Appearance:: Appears stated age Attitude/Demeanor/Rapport: Engaged Affect (typically observed): Accepting Orientation: : Oriented to Self, Oriented to Place, Oriented to  Time, Oriented to Situation Alcohol / Substance Use: Not Applicable Psych Involvement: No (comment)  Admission diagnosis:  Chronic pneumonia [J18.9] 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 Compression fracture of first lumbar vertebra ([place]) 06/08/2019  . Closed compression fracture of L3 lumbar vertebra, initial encounter ([place]) 06/08/2019  . Dyspnea 03/27/2018  . CKD (chronic kidney disease) stage 3, GFR 30-59 ml/min 03/27/2018  . AF (paroxysmal atrial fibrillation) ([place]) 03/27/2018  . Hypoxia   . Atrial fibrillation with RVR ([place])   . Acute respiratory failure ([place]) 10/22/2017  . Pleural effusion, bilateral 10/22/2017  . Anemia 10/22/2017  . Premature atrial contraction 09/05/2014  . Sick sinus syndrome ([place]) 03/07/2013  . Palpitations 08/17/2012  . Syncope 02/09/2012  . Acute head trauma 02/09/2012  . Subdural hematoma, acute ([place]) 02/09/2012  . SAH (subarachnoid hemorrhage) ([place]) 02/09/2012  . Hypothyroidism 03/08/2010  . HYPERLIPIDEMIA 03/08/2010  . CARDIAC MURMUR 03/08/2010  . CHEST PAIN 03/08/2010  . DIZZINESS 03/07/2010   PCP:  Merrilee Seashore, MD Pharmacy:   [id number] [place] [address]Phone: [phone]: [phone] [phone] [id number]: [phone]: [phone]    Social Determinants of Health (SDOH) Interventions    Readmission Risk Interventions No flowsheet data found.

## 2019-06-10 NOTE — Consult Note (Signed)
Cardiology Consultation:   Patient ID: Nancy Meadows MRN: [id number]; DOB: May 19, 1932  Admit date: 06/08/2019 Date of Consult: 06/10/2019  Primary Care Provider: Merrilee Seashore, MD Primary Cardiologist: Thompson Grayer, MD  Primary Electrophysiologist:  None    Patient Profile:   Nancy Meadows is a 86 y.o. female with a hx of syncope resulting in SDH/SAH and loop noting symptomatic pauses/SSS and PPM placed in 01/2016, hypothyroidism, HLD, permanent afib on Eliquis, chronic diastolic CHF and severe MR who is being seen today for the evaluation of Mitral Regurgitation and CHF at the request of Dr. Lonny Prude.  History of Present Illness:   Nancy Meadows has been followed by Dr. Stanford Breed and Dr. Rayann Heman for the above cardiac issues. Echo in 10/2017 showed LVEF 50-55%, moderate MR with mildly thickened leaflets, LA mildly dilated, mild TR. She was last seen 04/16/19 on televisit with Joesph July PA-C. Afib rates previously not so well controlled on Toprol and patient was occasionally sob taking extra lasix at times. Toprol was increased and patient was doing much better. PPM functioning normally. Fluid status stable. No changes made.   More recently the patient has been seeing cardiology at [facility] for severe MR and Mitraclip work-up. Echo 4/9 showed EF 50-55% with anterior MVP and severe MR, ERO estimated at 0.43cm2, mod to severe TR, mod PH, RV pressure 30mHg. TTE 4/12 showed severe MI, EROA 0.45cm2, degenerative etiology. Cardiac cath showed normal coronaries 05/14/19.   [facility], respiratory failure, septic shock 2/2 in CAP in the ICU, afib, AKI. Cardiac meds were stopped for hypotension and metoprolol started at 275mBID at discharge.   [facility]. She recently moved to a new place with wood floors and slipped and fell on her backside 2 weeks ago. She was found to have stable hemoglobin and CKD. BNP  660. CXR showed cardiac enlargement and no CHF with possible RUL pneumonia.  With hip pain x-ry of right hip was obtained showing no fracture fur dilation of bowel. CT abd and pelvis showed gas but no obstruction. Also showed compression fracture L1 and L3. Lumbar brace was ordered. EGG showed rate controlled afib. The patient was admitted for further work-up. Patient became hypotensive and Lactic acid noted to be elevated, given IVF.    Past Medical History:  Diagnosis Date  . Acute head trauma 02/09/2012  . Acute respiratory failure ([facility]10/09/2017  . Anemia 10/22/2017  . Arthritis    ddd-- occ. pain  . Cholelithiasis   . CKD (chronic kidney disease) stage 3, GFR 30-59 ml/min 03/27/2018  . DIZZINESS 03/07/2010   Qualifier: Diagnosis of  By: BaBurnett Kanaris  . Dyspnea 03/27/2018  . Eczema   . Goiter   . HLD (hyperlipidemia)   . Hx of cardiovascular stress test    a. ETT-MV 7/14:  Low risk, ant defect c/w soft tissue atten., no ischemia, EF 77%  . HYPERLIPIDEMIA 03/08/2010   Qualifier: Diagnosis of  By: CrStanford BreedMD, FAKandyce Rud . Hypothyroidism 03/08/2010   Qualifier: Diagnosis of  By: CrStanford BreedMD, FAKandyce Rud . Hypoxia   . Lesion of bladder    hematuria/ frequency  . Palpitations 08/17/2012  . Paroxysmal atrial fibrillation (HCLatty01/2018   cahds2vasc sore is 3.  consider anticoagulation if afib burden increases  . Pleural effusion, bilateral 10/22/2017  . SAH (subarachnoid hemorrhage) ([facility]1/26/2014  .  SDH (subdural hematoma) (HCC)    in the setting of syncope and fall prior to PPM implant, now resolved  . Sick sinus syndrome ([place])   . Subdural hematoma, acute ([place]) 02/09/2012  . Syncope    pauses of >4 seconds    Past Surgical History:  Procedure Laterality Date  . ABDOMINAL HYSTERECTOMY  age 65  . APPENDECTOMY  age 37  . bilateral salpingoophectomy  age 15  . CATARACT EXTRACTION W/ INTRAOCULAR LENS  IMPLANT, BILATERAL  2012  . CYSTOSCOPY WITH BIOPSY   11/21/2010   Procedure: CYSTOSCOPY WITH BIOPSY;  Surgeon: Molli Hazard, MD;  Location: [facility];  Service: Urology;  Laterality: N/A;  CYSTOSCOPY WITH BLADDER BIOPSY  AND INSTILLATION OF INDIGO CARMINE  . CYSTOSCOPY/RETROGRADE/URETEROSCOPY  11/21/2010   Procedure: CYSTOSCOPY/RETROGRADE/URETEROSCOPY;  Surgeon: Molli Hazard, MD;  Location: [facility];  Service: Urology;  Laterality: Left;  . gallstones removed  age 74  . LOOP RECORDER IMPLANT N/A 12/31/2012   Procedure: LOOP RECORDER IMPLANT;  Surgeon: Coralyn Mark, MD;  Location: [place] [facility];  Service: Cardiovascular;  Laterality: N/A;  . PACEMAKER INSERTION  05-20-2013   MDT ADDRL1 pacemaker implanted by Dr Rayann Heman for SSS and symptomatic bradycardia  . PERMANENT PACEMAKER INSERTION Left 05/20/2013   Procedure: PERMANENT PACEMAKER INSERTION;  Surgeon: Coralyn Mark, MD;  Location: [place] C[facility];  Service: Cardiovascular;  Laterality: Left;     Home Medications:  Prior to Admission medications   Medication Sig Start Date End Date Taking? Authorizing Provider  Cholecalciferol 25 MCG (1000 UT) tablet Take 1 tablet by mouth daily.   Yes [provider]  diltiazem (CARDIZEM CD) 120 MG 24 hr capsule Take 120 mg by mouth every other day. 04/27/19  Yes [provider]  ELIQUIS 5 MG TABS tablet TAKE 1 TABLET BY MOUTH TWICE A DAY Patient taking differently: Take 5 mg by mouth 2 (two) times daily.  11/23/18  Yes Allred, Jeneen Rinks, MD  ferrous sulfate 325 (65 FE) MG tablet Take 325 mg by mouth daily. 04/20/19  Yes [provider]  furosemide (LASIX) 20 MG tablet Take 1 tablet (20 mg total) by mouth daily. Take 1 tablet at 2 PM every day Patient taking differently: Take 20 mg by mouth every other day.  03/28/18 06/08/19 Yes Georgette Shell, MD  levothyroxine (SYNTHROID, LEVOTHROID) 88 MCG tablet Take 88 mcg by mouth every morning.    Yes [provider]  metoprolol succinate  (TOPROL XL) 50 MG 24 hr tablet Take 1 tablet (50 mg total) by mouth 2 (two) times daily. 01/20/19 01/20/20 Yes Baldwin Jamaica, PA-C  ondansetron (ZOFRAN) 4 MG tablet Take 4 mg by mouth 2 (two) times daily as needed for nausea or vomiting.  05/21/19  Yes [provider]  potassium chloride (KLOR-CON) 10 MEQ tablet Take 1 tablet (10 mEq total) by mouth daily. 12/08/18  Yes Shirley Friar, PA-C  zinc gluconate 50 MG tablet Take 50 mg by mouth daily.   Yes [provider]    Inpatient Medications: Scheduled Meds: . apixaban  5 mg Oral BID  . ferrous sulfate  325 mg Oral Daily  . levothyroxine  88 mcg Oral Q0600  . potassium chloride  10 mEq Oral Daily  . sodium chloride flush  3 mL Intravenous Q12H   Continuous Infusions:  PRN Meds: acetaminophen **OR** acetaminophen, ondansetron **OR** ondansetron (ZOFRAN) IV  Allergies:    Allergies  Allergen Reactions  . Acetaminophen Other (  See Comments)    Syncope  . Meclizine Other (See Comments)    Increased dizziness and confusion    Social History:   Social History   Socioeconomic History  . Marital status: Widowed    Spouse name: Not on file  . Number of children: Not on file  . Years of education: Not on file  . Highest education level: Not on file  Occupational History  . Not on file  Tobacco Use  . Smoking status: Never Smoker  . Smokeless tobacco: Never Used  . Tobacco comment: tobacco use - no  Substance and Sexual Activity  . Alcohol use: No  . Drug use: No  . Sexual activity: Not on file  Other Topics Concern  . Not on file  Social History Narrative   Married, pt time Control and instrumentation engineer.    Social Determinants of Health   Financial Resource Strain:   . Difficulty of Paying Living Expenses:   Food Insecurity:   . Worried About Charity fundraiser in the Last Year:   . Arboriculturist in the Last Year:   Transportation Needs:   . Film/video editor (Medical):   Marland Kitchen Lack of Transportation  (Non-Medical):   Physical Activity:   . Days of Exercise per Week:   . Minutes of Exercise per Session:   Stress:   . Feeling of Stress :   Social Connections:   . Frequency of Communication with Friends and Family:   . Frequency of Social Gatherings with Friends and Family:   . Attends Religious Services:   . Active Member of Clubs or Organizations:   . Attends Archivist Meetings:   Marland Kitchen Marital Status:   Intimate Partner Violence:   . Fear of Current or Ex-Partner:   . Emotionally Abused:   Marland Kitchen Physically Abused:   . Sexually Abused:     Family History:   Family History  Problem Relation Age of Onset  . Congestive Heart Failure Father   . Heart disease Father   . Stroke Mother      ROS:  Please see the history of present illness.  All oyher ROS reviewed and negative.     Physical Exam/Data:   Vitals:   06/09/19 1341 06/09/19 1800 06/09/19 2155 06/10/19 0517  BP: 119/90  106/74 104/80  Pulse: 78  88 83  Resp: _0 Temp: 98.7 F (37.1 C)  98.5 F (36.9 C) (!) 97.4 F (36.3 C)  TempSrc: Oral  Oral   SpO2: 100% 94% 100% 100%  Weight:      Height:        Intake/Output Summary (Last 24 hours) at 06/10/2019 0950 Last data filed at 06/10/2019 0912 Gross per 24 hour  Intake 483 ml  Output 0 ml  Net 483 ml   Last 3 Weights 06/08/2019 04/16/2019 03/08/2019  Weight (lbs) 132 lb 140 lb 6.4 oz 139 lb  Weight (kg) 59.875 kg 63.685 kg 63.05 kg     Body mass index is 20.67 kg/m.  General:  Well nourished, well developed, in no acute distress HEENT: normal Lymph: no adenopathy Neck: mod JVD Endocrine:  No thryomegaly Vascular: No carotid bruits; FA pulses 2+ bilaterally without bruits  Cardiac:  normal S1, S2; Irreg Irreg; + murmur  Lungs:  clear to auscultation bilaterally, no wheezing, rhonchi or rales  Abd: soft, nontender, no hepatomegaly  Ext: 1-2+ LL edema Musculoskeletal:  No deformities, BUE and BLE strength normal and equal Skin:  warm and dry    Neuro:  CNs 2-12 intact, no focal abnormalities noted Psych:  Normal affect   EKG:  The EKG was personally reviewed and demonstrates:  Afib, PVCs, nonspecific T wave abnormality inf/lateral leads Telemetry:  Telemetry was personally reviewed and demonstrates:  N/A  Relevant CV Studies:  Cardiac cath 05/14/19 Right and left heart cath with coronary angiogram for MitraClip workup.  Right A/C access for venous Findings below  Right radial access for arterial access.  Clean coronary arteries.  Right dominant system.   TEE 04/26/19 SUMMARY The left ventricular size is normal. Global LV systolic function is preserved The right ventricle is mildly dilated. There is biatrial dilation. No thrombus is detected in the left atrial appendage. There is severe mitral regurgitation. EROA 0.45 cm2 Etiology of mitral regurgitation is degenerative . There is moderate tricuspid regurgitation. Pulmonary venous flow pattern shows systolic reversal There is small size pericardial effusion. There is no prior TEE for comparison.  Manual pressure Right A/C  PreludSYNC for hemostasis to right radial  EBL<5 cc No complications.    Echo 04/23/19 SUMMARY The left ventricular size is normal. There is normal left ventricular wall thickness. LV ejection fraction = 50-55%. Left ventricular filling pattern is indeterminate. No segmental wall motion abnormalities seen in the left ventricle The right ventricular systolic function is mildly reduced. The left atrium is severely dilated. The right atrium is moderately dilated. There is anterior mitral valve prolapse with thickened leaflet. There is severe mitral regurgitation. Eccentric MR, ERO estimated at 0.43 cm^2. There is moderate to severe tricuspid regurgitation. Moderate pulmonary hypertension. Estimated right ventricular systolic pressure is 58 mmHg. Systolic flow reversal of the hepatic vein spectral Doppler flow pattern. Trace pulmonic  valvular regurgitation. The aortic sinus is normal size. IVC size was mildly dilated. There is no pericardial effusion. No comparison study.   Echo 10/2017 Study Conclusions   - Left ventricle: The cavity size was normal. Systolic function was  normal. The estimated ejection fraction was in the range of 50%  to 55%. Wall motion was normal; there were no regional wall  motion abnormalities.  - Mitral valve: Mildly thickened leaflets . There was moderate  regurgitation directed posteriorly.  - Left atrium: The atrium was mildly dilated.  - Right ventricle: Pacer wire or catheter noted in right ventricle.  - Tricuspid valve: There was mild regurgitation.  - Pericardium, extracardiac: There was a left pleural effusion.   Laboratory Data:  High Sensitivity Troponin:  No results for input(s): TROPONINIHS in the last 720 hours.   Chemistry Recent Labs  Lab 06/08/19 0424 06/09/19 0307 06/10/19 0846  NA 135 136 140  K 3.7 3.9 4.1  CL 101 105 107  CO2 _0 GLUCOSE 107* 86 101*  BUN 31* 21 14  CREATININE 1.27* 0.98 0.96  CALCIUM 8.7* 8.2* 8.9  GFRNONAA 39* 53* 54*  GFRAA 45* >60 >60  ANIONGAP _1 Recent Labs  Lab 06/08/19 0424 06/09/19 0307  PROT 6.6 5.8*  ALBUMIN 2.8* 2.6*  AST 21 20  ALT 15 15  ALKPHOS 68 70  BILITOT 1.8* 1.7*   Hematology Recent Labs  Lab 06/08/19 2049 06/09/19 0307 06/10/19 0846  WBC 3.6* 3.7* 3.4*  RBC 3.02* 3.03* 3.24*  HGB 9.0* 8.9* 9.8*  HCT 28.8* 28.5* 31.3*  MCV 95.4 94.1 96.6  MCH 29.8 29.4 30.2  MCHC 31.3 31.2 31.3  RDW 28.6* 28.5* 29.4*  PLT 132* 126* 153  BNP Recent Labs  Lab 06/08/19 0424  BNP 660.6*    DDimer No results for input(s): DDIMER in the last 168 hours.   Radiology/Studies:  DG Chest 2 View  Result Date: 06/08/2019 CLINICAL DATA:  Short of breath and weakness.  Question CHF EXAM: CHEST - 2 VIEW COMPARISON:  04/05/2019.  CT abdomen pelvis 06/08/2019 FINDINGS: Cardiac enlargement. Dual  lead pacemaker in good position and unchanged. Negative for congestive heart failure. No edema or effusion Interval development of right upper lobe infiltrates with possible cavitation. Probable pneumonia. No acute skeletal abnormality. IMPRESSION: Cardiac enlargement without heart failure Interval development of extensive right upper lobe infiltrate with probable cavitation. Probable pneumonia. Electronically Signed   By: Franchot Gallo M.D.   On: 06/08/2019 08:05   CT Abdomen Pelvis W Contrast  Result Date: 06/08/2019 CLINICAL DATA:  Abdominal distension, fall 3 days prior, right hip and lower back pain, sacrococcygeal tenderness as well. EXAM: CT ABDOMEN AND PELVIS WITH CONTRAST TECHNIQUE: Multidetector CT imaging of the abdomen and pelvis was performed using the standard protocol following bolus administration of intravenous contrast. CONTRAST:  36m OMNIPAQUE IOHEXOL 300 MG/ML  SOLN COMPARISON:  CT 06/26/2016 FINDINGS: Lower chest: There is a small to moderate right pleural effusion with adjacent atelectatic changes in the right lung base. More bandlike areas of opacity in left lung base likely reflect atelectasis and/or scarring. There is cardiomegaly with predominantly right atrial enlargement with notable reflux of contrast into the hepatic veins and IVC. Pacer leads are seen at the cardiac apex and right atrium. No pericardial effusion. Hepatobiliary: Slight heterogeneity of enhancement on delayed phase imaging towards the periphery of the right lobe liver may reflect some features of chronic hepatic congestion/altered perfusion. No concerning focal liver lesion is seen. Smooth liver surface contour. Gallbladder and biliary tree are unremarkable without visible calcified gallstones. Pancreas: Mild pancreatic atrophy. No focal concerning pancreatic lesion. No ductal dilatation or peripancreatic inflammation. Spleen: Normal in size without focal abnormality. Adrenals/Urinary Tract: Normal adrenal glands.  Kidneys enhance and excrete symmetrically. Stable appearance of a 2.2 cm fluid attenuation cyst in the posterior lower pole left kidney. A nonobstructing 1.4 cm calculus is seen in the left lower pole as well. No concerning renal lesion, obstructive urolithiasis or hydronephrosis. Urinary bladder is largely decompressed at the time of exam and therefore poorly evaluated by CT imaging. No gross bladder abnormality. Stomach/Bowel: Distal esophagus, stomach and duodenum are unremarkable. No small bowel thickening or dilatation. The appendix is surgically absent. Chronic intramural fat throughout the colon is a nonspecific finding that can be seen both as sequela of prior inflammatory bowel disease or obese body habitus. No significant acute colonic wall thickening or dilatation. No pericolonic stranding or inflammatory change. Vascular/Lymphatic: Atherosclerotic calcifications throughout the abdominal aorta and branch vessels. No aneurysm or ectasia. Reflux of contrast into the IVC and hepatic veins as above. No enlarged abdominopelvic lymph nodes. Reproductive: Uterus is surgically absent. No concerning adnexal lesions. Other: Diffuse mild body wall edema as well as some central mesenteric edematous change. No abdominopelvic free air. There is a small volume of low-attenuation free fluid in the deep pelvis of unclear etiology. No bowel containing hernias. Musculoskeletal: There is new anterior wedging compression deformity of the L1 vertebral body with approximately 35% height loss anteriorly (AO spine A1). Additional acute anterior wedging compression deformity of the L3 vertebral body with 15% height loss anteriorly (AO spine A1). No other acute fracture or suspicious osseous lesion is seen. No visible sacral insufficiency fractures.  The bones of the pelvis are intact and congruent with arthrosis at the symphysis pubis likely reflecting sequela of prior osteitis pubis. There is levocurvature of the lumbar spine with  an apex at the L3 level. Partial ankylosis along the left lateral aspect of the L5-S1 disc space is unchanged from prior. Stable hemangiomata at L4. IMPRESSION: 1. Acute anterior wedging compression deformity of the L1 vertebral body with 35% height loss anteriorly (AO spine A1). 2. Acute anterior wedging compression deformity of the L3 vertebral body with 15% height loss anteriorly (AO spine A1). 3. Features suggestive of anasarca including a small to moderate right pleural effusion, diffuse body wall edema and central mesenteric edematous changes. Small volume of low-attenuation fluid in the deep pelvis is favored to be secondary to this volume third-spacing/fluid overload rather than an occult bowel injury though should correlate with serial abdominal exam findings. 4. No other acute traumatic findings in the abdomen or pelvis. 5. Cardiomegaly with predominantly right atrial enlargement with notable reflux of contrast into the hepatic veins and IVC, suggestive of underlying right heart failure. Heterogeneous attenuation of the liver particularly in the right lobe may reflect some underlying hepatic congestion/altered perfusion as well. 6. Nonobstructing left nephrolithiasis. 7. Chronic intramural fat throughout the colon is a nonspecific finding that can be seen both as sequela of prior inflammatory bowel disease or obese body habitus. 8. Aortic Atherosclerosis (ICD10-I70.0). Electronically Signed   By: Lovena Le M.D.   On: 06/08/2019 06:26   DG Hip Unilat W or Wo Pelvis 2-3 Views Right  Result Date: 06/08/2019 CLINICAL DATA:  Fall a few days ago, hip pain EXAM: DG HIP (WITH OR WITHOUT PELVIS) 2-3V RIGHT COMPARISON:  None. FINDINGS: The bones of the pelvis are intact and congruent. Mild degenerative changes in the spine and SI joints. Additional arthrosis at the symphysis pubis. Proximal femora are intact and normally located. There is moderate bilateral hip arthrosis. Few phleboliths are noted in the  pelvis. Nonspecific air distention of the colon without high-grade bowel obstruction. There is a diffusely edematous appearance of the soft tissues. IMPRESSION: 1. No acute osseous abnormality. 2. Nonspecific air distention of the colon without high-grade bowel obstruction. 3. Question some diffuse mild edematous changes, correlate with fluid status. Electronically Signed   By: Lovena Le M.D.   On: 06/08/2019 04:27   { Assessment and Plan:   Severe MI - currently being worked up at [facility] everywhere Echo 4/9 showed EF 50-55% with anterior MVP and severe MR, ERO estimated at 0.43cm2. mod to severe TR, mod PH, RV pressure 18mHg. TTE 4/12 showed severe MI, EROA 0.45cm2, degenerative etiology. Cardiac cath showed normal coronaries 05/14/19. - During admission patient has been hypotensive with lactic acidosis improved with IVF. ?Unsure relation to MR. Will discuss plan with MD.  RUL infiltrate/Previous PNA treated at [facility]/RML nodule - afebrile with no leukocytosis or cough - procal wnl - plan for repeat chest CT for RML nodule - no abx - per IM  Chronic diastolic HF/Concern for Rt sided HF/PH - Recent echo with mod PH with RVSP 58 and  - CT abd showed Rt atrial enlargement and possible hepatic congestion - BNP 660, CXR without HF - alk phos wnl - Lasix held for hypotension - pt was given gentle hydration for hypotension - Patient does is fluid up on exam. Most recent BP 104/80. Consider starting oral lasix. She takes Lasix 218mevery other day.  HTN - hypotension overnight and lasix  and Toprol held. Appears patient was also on Cardizem prior to admission. Not recorded in office note 04/16/19 - BP improving   Permanent Afib - Toprol held for hypotension - rates controlled - Eliquis  For questions or updates, please contact [facility] Please consult [email or web address] for contact info under     Signed, Landon Bassford Ninfa Meeker, PA-C  06/10/2019 9:50 AM

## 2019-06-10 NOTE — Progress Notes (Signed)
[facility]  PROGRESS NOTE  Trenity Pha Meadows BJS:[id number] DOB: November 08, 1932 DOA: 06/08/2019 PCP: Merrilee Seashore, MD Admit date - 06/08/2019   Admitting Physician Darliss Cheney, MD  Outpatient Primary MD for the patient is Merrilee Seashore, MD  LOS - 0 Brief Narrative   Nancy Meadows is a 86 y.o. year old female with medical history significant for CKD stage III, hypothyroidism, sick sinus syndrome status post pacemaker (05/2013) CHF with preserved EF, HLD, A. fib/flutter on Eliquis and severe MR who presented on 06/08/2019 with worsening right-sided belly and lower back pain after a fall about a week ago after returning from her stay at a rehab facility after recent .    In the ED, patient was afebrile, hemodynamically stable a, Covid test negative, lab work notable for BUN 31, creatinine 1.27, WBC 3.7, hemoglobin 8.9, platelets 126, lipase 56, BNP 660.  Chest x-ray showed cardiac enlargement without heart failure and interval development of extensive right upper lobe infiltrate with possible cavitation and probable pneumonia.  CT abdomen showed acute compression fracture of L1 and L3 vertebra, and possible anasarca with diffuse body wall edema, as well as cardiomegaly with right atrial enlargement and sign suggestive of underlying right heart failure with hepatic congestion.  Patient has severe mitral regurgitation.  Underwent left and right heart cath at [facility] on 05/11/2019.  Ultimate plan to pursue MitraClip  Patient was admitted from 05/14/2019-05/21/2019 at [facility] for acute hypoxic respiratory failure with septic shock secondary to community-acquired lobar pneumonia of right upper lobe (negative for strep pneumo, Legionella, Covid, mycoplasma at the time) complicated by A. fib with RVR and hospital-acquired delirium found to have right middle lobe nodule 5 mm consistent with cavitation   Subjective  Today she denies any recurrent abdominal pain, no chest pain, no shortness  of breath.  No fevers or chills  A & P    Extensive right upper lobe infiltrate with possible cavitation likely sequelae from recent hospitalization for right lobar pneumonia with cavitation, stable.  Noted concern for potential pneumonia on admitting chest x-ray, but was recently treated for bacterial pneumonia at [facility] with R lobar consolidation and cavitation and RML nodule noted at that time ( 4/30-5/7).  Currently normal respiratory effort on room air without cough,and no fever, procalcitonin less than 0.10 culture remarkable.    -Needs repeat CT chest in 6 to 8 weeks to reevaluate RML nodule -Given afebrile, normal respiratory status, wnl procalcitonin would not start antibiotics doubt active infection -Home desaturation testing, ambulatory O2 -Encourage incentive spirometry  Hypotension with mild lactic acidosis, stable.  Briefly needed IV fluids for SBP in the 80s during admission, blood pressure improved while holding home BP meds and given IV fluids.   Worried this could be from decreased CO from her chronic severe MR with right sided heart failure.   -status post timed IV fluids -Currently holding home Toprol, will resume Lasix with IV dosing given some signs of volume overload -Cardiology consulted, now that blood pressure better would like to give IV Lasix for mild volume overload -continue to monitor blood pressure  Severe mitral valve regurgitation with concern for Right sided heart failure with chronic pulmonary hypertension and chronic CHF with preserved EF, mildly volume overloaded.  TTE (04/23/2019 with preserved EF 50-55%, moderate to severe TR, moderate pulmonary HTN with RVSP 58), TEE (04/26/2019) with moderate TR, severe MR .  Has some noted body wall edema/anasarca on CT abdomen with right atria enlargement and contrast in hepatic veins  and possible hepatic congestion.  Bilateral ankle edema, elevated BNP in the 600s on admission, likely slight worse due to IV fluids given  for hypotension earlier this admission.  Very complicated management given severe mitral valve regurgitation that ultimately needs surgical repair and difficulty diuresing given hypotension during hospital stay -Undergoing outpatient work-up with cardiology for planned mitral clip -Cardiology consulted, recommends IV Lasix 40 mg x 1, will closely monitor volume status and blood pressure -Daily weights, strict I's and O's 1   Acute L3/L1 vertebrae compression fractures.  After a fall. -PT/OT evaluation recommends home health PT/OT -TLSO brace in place   HTN.   did have episodes of hypotension with SBP in the 80s earlier in hospital stay.  Blood pressure improved while holding home BP meds and timed IV fluids.  Given some concern for mild volume overload will now reinitiate Lasix with IV dosing and monitor -IV Lasix 40 mg x 1 -Continue to hold homeToprol  Afib/a flutter, currently rate controlled -Continue home Eliquis -Holding home Toprol, due to recent hypotensive episodes -Diltiazem was been discontinued during previous hospital stay at [facility]  Hypothyroidism, stable -Continue home Synthroid  Normocytic anemia, chronic.  Hemoglobin stable at 8.9-9 since admission with no signs of symptoms of bleeding. -Continue home iron  CKD stage III, stable at baseline creatinine 1.1-1.2.-Avoid nephrotoxins -Monitor output, repeat BMP    Family Communication  : Updated son Jacelyn Grip on the phone  Code Status : Partial Disposition Plan  :  Patient is from home. Anticipated d/c date:  24 to 48 hours. Barriers to d/c or necessity for inpatient status:  Change from observation to inpatientClose monitoring of blood pressure given severe mitral regurgitation with possible right-sided heart failure that required maintenance IV fluids and current discontinuation of home BP meds, cardiology consulted and recommends IV Lasix for mild volume overload, will need close monitoring of blood pressure  given previous hypotension for response in setting of severe MR with right-sided heart failure Consults  : Cardiology  Procedures  : None  DVT Prophylaxis  : Eliquis  Lab Results  Component Value Date   PLT 153 06/10/2019    Diet :  Diet Order            Diet 2 gram sodium Room service appropriate? Yes; Fluid consistency: Thin  Diet effective now               Inpatient Medications Scheduled Meds: . apixaban  5 mg Oral BID  . ferrous sulfate  325 mg Oral Daily  . furosemide  40 mg Intravenous Once  . levothyroxine  88 mcg Oral Q0600  . potassium chloride  10 mEq Oral Daily  . sodium chloride flush  3 mL Intravenous Q12H   Continuous Infusions: PRN Meds:.acetaminophen **OR** acetaminophen, ondansetron **OR** ondansetron (ZOFRAN) IV  Antibiotics  :   Anti-infectives (From admission, onward)   Start     Dose/Rate Route Frequency Ordered Stop   06/08/19 0745  cephALEXin (KEFLEX) capsule 250 mg     250 mg Oral  Once 06/08/19 0740 06/08/19 0857       Objective   Vitals:   06/09/19 1800 06/09/19 2155 06/10/19 0517 06/10/19 1330  BP:  106/74 104/80 111/78  Pulse:  88 83 62  Resp:  _0 Temp:  98.5 F (36.9 C) (!) 97.4 F (36.3 C) 98.9 F (37.2 C)  TempSrc:  Oral  Oral  SpO2: 94% 100% 100% 100%  Weight:  Height:        SpO2: 100 % O2 Flow Rate (L/min): 2 L/min  Wt Readings from Last 3 Encounters:  06/08/19 59.9 kg  04/16/19 63.7 kg  03/08/19 63 kg     Intake/Output Summary (Last 24 hours) at 06/10/2019 1728 Last data filed at 06/10/2019 1300 Gross per 24 hour  Intake 480 ml  Output 50 ml  Net 430 ml    Physical Exam:  Awake Alert, Oriented X 3, Normal affect No new F.N deficits,  [place].AT, Normal respiratory effort on room air, CTAB Irregular rhythm, normal rate, systolic murmur appreciated, 1+ bilateral pitting edema of lower extremities to mid calf Abdomen soft, nondistended, nontender, normal bowel sounds +ve B.Sounds, Abd Soft, No  tenderness, No rebound, guarding or rigidity. No Cyanosis, No new Rash or bruise     I have personally reviewed the following:   Data Reviewed:  CBC Recent Labs  Lab 06/08/19 0424 06/08/19 2049 06/09/19 0307 06/10/19 0846  WBC 3.7* 3.6* 3.7* 3.4*  HGB 8.9* 9.0* 8.9* 9.8*  HCT 27.9* 28.8* 28.5* 31.3*  PLT 126* 132* 126* 153  MCV 93.3 95.4 94.1 96.6  MCH 29.8 29.8 29.4 30.2  MCHC 31.9 31.3 31.2 31.3  RDW 28.1* 28.6* 28.5* 29.4*  LYMPHSABS 1.3  --   --   --   MONOABS 0.3  --   --   --   EOSABS 0.0  --   --   --   BASOSABS 0.0  --   --   --     Chemistries  Recent Labs  Lab 06/08/19 0424 06/08/19 0956 06/09/19 0307 06/10/19 0846  NA 135  --  136 140  K 3.7  --  3.9 4.1  CL 101  --  105 107  CO2 26  --  24 25  GLUCOSE 107*  --  86 101*  BUN 31*  --  21 14  CREATININE 1.27*  --  0.98 0.96  CALCIUM 8.7*  --  8.2* 8.9  MG  --  1.8  --   --   AST 21  --  20  --   ALT 15  --  15  --   ALKPHOS 68  --  70  --   BILITOT 1.8*  --  1.7*  --    ------------------------------------------------------------------------------------------------------------------ No results for input(s): CHOL, HDL, LDLCALC, TRIG, CHOLHDL, LDLDIRECT in the last 72 hours.  No results found for: HGBA1C ------------------------------------------------------------------------------------------------------------------ Recent Labs    06/08/19 0956  TSH 2.818   ------------------------------------------------------------------------------------------------------------------ No results for input(s): VITAMINB12, FOLATE, FERRITIN, TIBC, IRON, RETICCTPCT in the last 72 hours.  Coagulation profile No results for input(s): INR, PROTIME in the last 168 hours.  No results for input(s): DDIMER in the last 72 hours.  Cardiac Enzymes No results for input(s): CKMB, TROPONINI, MYOGLOBIN in the last 168 hours.  Invalid input(s):  CK ------------------------------------------------------------------------------------------------------------------    Component Value Date/Time   BNP 660.6 (H) 06/08/2019 0424    Micro Results Recent Results (from the past 240 hour(s))  Urine culture     Status: Abnormal   Collection Time: 06/08/19  5:33 AM   Specimen: Urine, Catheterized  Result Value Ref Range Status   Specimen Description   Final    URINE, CATHETERIZED Performed at [facility] [address] [place], [place] [address]    Special Requests   Final    Normal Performed at [facility], [place] [address], [place], [place] [address]  Culture MULTIPLE SPECIES PRESENT, SUGGEST RECOLLECTION (A)  Final   Report Status 06/10/2019 FINAL  Final  SARS Coronavirus 2 by RT PCR (hospital order, performed in [facility] hospital lab) Nasopharyngeal Nasopharyngeal Swab     Status: None   Collection Time: 06/08/19  9:12 AM   Specimen: Nasopharyngeal Swab  Result Value Ref Range Status   SARS Coronavirus 2 NEGATIVE NEGATIVE Final    Comment: (NOTE) SARS-CoV-2 target nucleic acids are NOT DETECTED. The SARS-CoV-2 RNA is generally detectable in upper and lower respiratory specimens during the acute phase of infection. The lowest concentration of SARS-CoV-2 viral copies this assay can detect is 250 copies / mL. A negative result does not preclude SARS-CoV-2 infection and should not be used as the sole basis for treatment or other patient management decisions.  A negative result may occur with improper specimen collection / handling, submission of specimen other than nasopharyngeal swab, presence of viral mutation(s) within the areas targeted by this assay, and inadequate number of viral copies (<250 copies / mL). A negative result must be combined with clinical observations, patient history, and epidemiological information. Fact Sheet for Patients:    StrictlyIdeas.no Fact Sheet for Healthcare Providers: BankingDealers.co.za This test is not yet approved or cleared  by the [place] FDA and has been authorized for detection and/or diagnosis of SARS-CoV-2 by FDA under an Emergency Use Authorization (EUA).  This EUA will remain in effect (meaning this test can be used) for the duration of the COVID-19 declaration under Section 564(b)(1) of the Act, 21 U.S.C. section 360bbb-3(b)(1), unless the authorization is terminated or revoked sooner. Performed at [facility], [place] [address] [place], [place] [address]   Expectorated sputum assessment w rflx to resp cult     Status: None   Collection Time: 06/08/19  2:35 PM   Specimen: Expectorated Sputum  Result Value Ref Range Status   Specimen Description EXPECTORATED SPUTUM  Final   Special Requests NONE  Final   Sputum evaluation   Final    THIS SPECIMEN IS ACCEPTABLE FOR SPUTUM CULTURE Performed at [facility], [place] [address], [place], [place] [address]    Report Status 06/08/2019 FINAL  Final  Culture, respiratory     Status: None (Preliminary result)   Collection Time: 06/08/19  2:35 PM  Result Value Ref Range Status   Specimen Description   Final    EXPECTORATED SPUTUM Performed at [facility] [address] [place], [place] [address]    Special Requests   Final    NONE Reflexed from [id number] Performed at [facility] [address] [place], [place] [address]    Gram Stain   Final    RARE WBC PRESENT, PREDOMINANTLY PMN RARE GRAM POSITIVE COCCI RARE GRAM POSITIVE RODS    Culture   Final    Consistent with normal respiratory flora. Performed at [facility], [place] [address] [place], [place] [address]    Report Status PENDING  Incomplete    Radiology Reports DG Chest 2 View  Result Date: 06/08/2019 CLINICAL DATA:  Short of breath and  weakness.  Question CHF EXAM: CHEST - 2 VIEW COMPARISON:  04/05/2019.  CT abdomen pelvis 06/08/2019 FINDINGS: Cardiac enlargement. Dual lead pacemaker in good position and unchanged. Negative for congestive heart failure. No edema or effusion Interval development of right upper lobe infiltrates with possible cavitation. Probable pneumonia. No acute skeletal abnormality. IMPRESSION: Cardiac enlargement without heart failure Interval development of extensive  right upper lobe infiltrate with probable cavitation. Probable pneumonia. Electronically Signed   By: Franchot Gallo M.D.   On: 06/08/2019 08:05   CT Abdomen Pelvis W Contrast  Result Date: 06/08/2019 CLINICAL DATA:  Abdominal distension, fall 3 days prior, right hip and lower back pain, sacrococcygeal tenderness as well. EXAM: CT ABDOMEN AND PELVIS WITH CONTRAST TECHNIQUE: Multidetector CT imaging of the abdomen and pelvis was performed using the standard protocol following bolus administration of intravenous contrast. CONTRAST:  53m OMNIPAQUE IOHEXOL 300 MG/ML  SOLN COMPARISON:  CT 06/26/2016 FINDINGS: Lower chest: There is a small to moderate right pleural effusion with adjacent atelectatic changes in the right lung base. More bandlike areas of opacity in left lung base likely reflect atelectasis and/or scarring. There is cardiomegaly with predominantly right atrial enlargement with notable reflux of contrast into the hepatic veins and IVC. Pacer leads are seen at the cardiac apex and right atrium. No pericardial effusion. Hepatobiliary: Slight heterogeneity of enhancement on delayed phase imaging towards the periphery of the right lobe liver may reflect some features of chronic hepatic congestion/altered perfusion. No concerning focal liver lesion is seen. Smooth liver surface contour. Gallbladder and biliary tree are unremarkable without visible calcified gallstones. Pancreas: Mild pancreatic atrophy. No focal concerning pancreatic lesion. No ductal  dilatation or peripancreatic inflammation. Spleen: Normal in size without focal abnormality. Adrenals/Urinary Tract: Normal adrenal glands. Kidneys enhance and excrete symmetrically. Stable appearance of a 2.2 cm fluid attenuation cyst in the posterior lower pole left kidney. A nonobstructing 1.4 cm calculus is seen in the left lower pole as well. No concerning renal lesion, obstructive urolithiasis or hydronephrosis. Urinary bladder is largely decompressed at the time of exam and therefore poorly evaluated by CT imaging. No gross bladder abnormality. Stomach/Bowel: Distal esophagus, stomach and duodenum are unremarkable. No small bowel thickening or dilatation. The appendix is surgically absent. Chronic intramural fat throughout the colon is a nonspecific finding that can be seen both as sequela of prior inflammatory bowel disease or obese body habitus. No significant acute colonic wall thickening or dilatation. No pericolonic stranding or inflammatory change. Vascular/Lymphatic: Atherosclerotic calcifications throughout the abdominal aorta and branch vessels. No aneurysm or ectasia. Reflux of contrast into the IVC and hepatic veins as above. No enlarged abdominopelvic lymph nodes. Reproductive: Uterus is surgically absent. No concerning adnexal lesions. Other: Diffuse mild body wall edema as well as some central mesenteric edematous change. No abdominopelvic free air. There is a small volume of low-attenuation free fluid in the deep pelvis of unclear etiology. No bowel containing hernias. Musculoskeletal: There is new anterior wedging compression deformity of the L1 vertebral body with approximately 35% height loss anteriorly (AO spine A1). Additional acute anterior wedging compression deformity of the L3 vertebral body with 15% height loss anteriorly (AO spine A1). No other acute fracture or suspicious osseous lesion is seen. No visible sacral insufficiency fractures. The bones of the pelvis are intact and  congruent with arthrosis at the symphysis pubis likely reflecting sequela of prior osteitis pubis. There is levocurvature of the lumbar spine with an apex at the L3 level. Partial ankylosis along the left lateral aspect of the L5-S1 disc space is unchanged from prior. Stable hemangiomata at L4. IMPRESSION: 1. Acute anterior wedging compression deformity of the L1 vertebral body with 35% height loss anteriorly (AO spine A1). 2. Acute anterior wedging compression deformity of the L3 vertebral body with 15% height loss anteriorly (AO spine A1). 3. Features suggestive of anasarca including a small to moderate right  pleural effusion, diffuse body wall edema and central mesenteric edematous changes. Small volume of low-attenuation fluid in the deep pelvis is favored to be secondary to this volume third-spacing/fluid overload rather than an occult bowel injury though should correlate with serial abdominal exam findings. 4. No other acute traumatic findings in the abdomen or pelvis. 5. Cardiomegaly with predominantly right atrial enlargement with notable reflux of contrast into the hepatic veins and IVC, suggestive of underlying right heart failure. Heterogeneous attenuation of the liver particularly in the right lobe may reflect some underlying hepatic congestion/altered perfusion as well. 6. Nonobstructing left nephrolithiasis. 7. Chronic intramural fat throughout the colon is a nonspecific finding that can be seen both as sequela of prior inflammatory bowel disease or obese body habitus. 8. Aortic Atherosclerosis (ICD10-I70.0). Electronically Signed   By: Lovena Le M.D.   On: 06/08/2019 06:26   DG Hip Unilat W or Wo Pelvis 2-3 Views Right  Result Date: 06/08/2019 CLINICAL DATA:  Fall a few days ago, hip pain EXAM: DG HIP (WITH OR WITHOUT PELVIS) 2-3V RIGHT COMPARISON:  None. FINDINGS: The bones of the pelvis are intact and congruent. Mild degenerative changes in the spine and SI joints. Additional arthrosis at the  symphysis pubis. Proximal femora are intact and normally located. There is moderate bilateral hip arthrosis. Few phleboliths are noted in the pelvis. Nonspecific air distention of the colon without high-grade bowel obstruction. There is a diffusely edematous appearance of the soft tissues. IMPRESSION: 1. No acute osseous abnormality. 2. Nonspecific air distention of the colon without high-grade bowel obstruction. 3. Question some diffuse mild edematous changes, correlate with fluid status. Electronically Signed   By: Lovena Le M.D.   On: 06/08/2019 04:27     Time Spent in minutes  30     Desiree Hane M.D on 06/10/2019 at 5:28 PM  To page go to [email or web address] - password [facility]

## 2019-06-10 NOTE — Plan of Care (Signed)
  Problem: Activity: Goal: Risk for activity intolerance will decrease Outcome: Progressing   Problem: Pain Managment: Goal: General experience of comfort will improve Outcome: Progressing

## 2019-06-10 NOTE — Evaluation (Signed)
Occupational Therapy Evaluation Patient Details Name: Nancy Meadows MRN: [id number] DOB: 30-Apr-1932 Today's Date: 06/10/2019    History of Present Illness 86 y.o. year old female with medical history significant for CKD stage III, hypothyroidism, sick sinus syndrome status post pacemaker (05/2013) CHF with preserved EF, HLD, A. fib/flutter on Eliquis and severe MR who presented on 06/08/2019 with worsening right-sided belly and lower back pain after a fall about a week ago after returning from her stay at a rehab facility after recent    Clinical Impression   Patient with functional deficits listed below impacting safety/independence with self care. Patient overall supervision/min guard for functional transfers and ambulation with rolling walker with min cues for safety with hand placement. Pt require mod A to correctly don TLSO brace in sitting "they never showed me how to do it." pt reports son can take off work/come over "whenever I need him to" and lives right down the street from her apartment. Will continue to follow.    Follow Up Recommendations  Home health OT;Supervision - Intermittent    Equipment Recommendations  None recommended by OT       Precautions / Restrictions Precautions Precautions: Fall;Back Precaution Booklet Issued: No Required Braces or Orthoses: Spinal Brace Spinal Brace: Thoracolumbosacral orthotic;Applied in sitting position Spinal Brace Comments: for OOB Restrictions Weight Bearing Restrictions: No      Mobility Bed Mobility Overal bed mobility: Modified Independent                Transfers Overall transfer level: Needs assistance Equipment used: Rolling walker (2 wheeled) Transfers: Sit to/from Stand Sit to Stand: Supervision;Min guard         General transfer comment: cues for hand placement    Balance Overall balance assessment: Needs assistance Sitting-balance support: No upper extremity supported;Feet supported Sitting  balance-Leahy Scale: Good     Standing balance support: Bilateral upper extremity supported;During functional activity Standing balance-Leahy Scale: Fair Standing balance comment: stood sink side with unilateral support                           ADL either performed or assessed with clinical judgement   ADL Overall ADL's : Needs assistance/impaired     Grooming: Wash/dry face;Wash/dry hands;Supervision/safety;Min guard;Standing   Upper Body Bathing: Set up;Sitting   Lower Body Bathing: Supervison/ safety;Min guard;Sitting/lateral leans;Sit to/from stand   Upper Body Dressing : Moderate assistance;Sitting Upper Body Dressing Details (indicate cue type and reason): donning TLSO brace correctly Lower Body Dressing: Supervision/safety;Min guard;Sit to/from stand;Sitting/lateral leans Lower Body Dressing Details (indicate cue type and reason): pt able to don socks seated EOB Toilet Transfer: Supervision/safety;Min guard;Cueing for Furniture conservator/restorer;Ambulation;RW;Grab bars Toilet Transfer Details (indicate cue type and reason): cues to use grab bars Toileting- Clothing Manipulation and Hygiene: Supervision/safety;Sitting/lateral lean       Functional mobility during ADLs: Supervision/safety;Min guard;Rolling walker General ADL Comments: patient is close to baseline with min cues for safety with functional transfers, required most assist with donning TLSO properly                  Pertinent Vitals/Pain Pain Assessment: No/denies pain     Hand Dominance Right   Extremity/Trunk Assessment Upper Extremity Assessment Upper Extremity Assessment: Overall WFL for tasks assessed   Lower Extremity Assessment Lower Extremity Assessment: Defer to PT evaluation       Communication Communication Communication: No difficulties   Cognition Arousal/Alertness: Awake/alert Behavior During Therapy: WFL for tasks assessed/performed Overall  Cognitive Status: Within  Functional Limits for tasks assessed                                 General Comments: min cues for safety   General Comments  educate patient in pursed lip breathing techniques with activity            Home Living Family/patient expects to be discharged to:: Private residence Living Arrangements: Alone Available Help at Discharge: Family Type of Home: Apartment Home Access: Level entry     Home Layout: One level     Bathroom Shower/Tub: Teacher, early years/pre: Handicapped height Bathroom Accessibility: Yes How Accessible: Accessible via walker Home Equipment: [place] - 4 wheels;Walker - 2 wheels;Wheelchair - manual;Grab bars - toilet;Grab bars - tub/shower;Tub bench   Additional Comments: pt reports moved into senior apartment that is handicap accessible and is close to her son      Prior Functioning/Environment Level of Independence: Independent with assistive device(s)        Comments: reports I with cooking, laundry and driving        OT Problem List: Decreased activity tolerance;Impaired balance (sitting and/or standing);Decreased safety awareness;Decreased knowledge of use of DME or AE      OT Treatment/Interventions: Self-care/ADL training;Therapeutic exercise;Energy conservation;DME and/or AE instruction;Therapeutic activities;Patient/family education;Balance training    OT Goals(Current goals can be found in the care plan section) Acute Rehab OT Goals Patient Stated Goal: go home OT Goal Formulation: With patient Time For Goal Achievement: 06/24/19 Potential to Achieve Goals: Good  OT Frequency: Min 2X/week    AM-PAC OT "6 Clicks" Daily Activity     Outcome Measure Help from another person eating meals?: None Help from another person taking care of personal grooming?: A Little Help from another person toileting, which includes using toliet, bedpan, or urinal?: A Little Help from another person bathing (including washing,  rinsing, drying)?: A Little Help from another person to put on and taking off regular upper body clothing?: A Lot(TLSO brace) Help from another person to put on and taking off regular lower body clothing?: A Little 6 Click Score: 18   End of Session Equipment Utilized During Treatment: Rolling walker Nurse Communication: Mobility status  Activity Tolerance: Patient tolerated treatment well Patient left: in chair;with call bell/phone within reach;with nursing/sitter in room  OT Visit Diagnosis: Other abnormalities of gait and mobility (R26.89)                Time: 4129-0475 OT Time Calculation (min): 31 min Charges:  OT General Charges $OT Visit: 1 Visit OT Evaluation $OT Eval Moderate Complexity: 1 Mod OT Treatments $Self Care/Home Management : 8-22 mins  Delbert Phenix OT Pager: [place] 06/10/2019, 10:48 AM

## 2019-06-10 NOTE — Progress Notes (Signed)
PT Cancellation Note  Patient Details Name: Nancy Meadows MRN: [id number] DOB: Jan 20, 1932   Cancelled Treatment:     PT order received and eval attempted x 2 - pt eating bfast on first attempt and just back to bed on second attempt and states "I can't get up again now"  Will follow and proceed with eval when pt agreeable.   Dvontae Ruan 06/10/2019, 2:08 PM

## 2019-06-11 DIAGNOSIS — I5033 Acute on chronic diastolic (congestive) heart failure: Secondary | ICD-10-CM

## 2019-06-11 DIAGNOSIS — I5031 Acute diastolic (congestive) heart failure: Secondary | ICD-10-CM

## 2019-06-11 DIAGNOSIS — W19XXXD Unspecified fall, subsequent encounter: Secondary | ICD-10-CM

## 2019-06-11 DIAGNOSIS — D649 Anemia, unspecified: Secondary | ICD-10-CM

## 2019-06-11 LAB — CULTURE, RESPIRATORY W GRAM STAIN: Culture: NORMAL

## 2019-06-11 LAB — BASIC METABOLIC PANEL
Anion gap: 8 (ref 5–15)
BUN: 11 mg/dL (ref 8–23)
CO2: 25 mmol/L (ref 22–32)
Calcium: 8.3 mg/dL — ABNORMAL LOW (ref 8.9–10.3)
Chloride: 106 mmol/L (ref 98–111)
Creatinine, Ser: 0.89 mg/dL (ref 0.44–1.00)
GFR calc Af Amer: >60 mL/min (ref >60)
GFR calc non Af Amer: 60 mL/min — ABNORMAL LOW (ref >60)
Glucose, Bld: 92 mg/dL (ref 70–99)
Potassium: 3.7 mmol/L (ref 3.5–5.1)
Sodium: 139 mmol/L (ref 135–145)

## 2019-06-11 MED ORDER — FUROSEMIDE 40 MG PO TABS
40.0000 mg | ORAL_TABLET | Freq: Every day | ORAL | Status: DC
  Administered 2019-06-11: 40 mg via ORAL
  Filled 2019-06-11: qty 1

## 2019-06-11 MED ORDER — FUROSEMIDE 20 MG PO TABS: 40.0000 mg | ORAL_TABLET | Freq: Every day | ORAL | 11 refills | Status: DC

## 2019-06-11 NOTE — Progress Notes (Signed)
Physical Therapy Treatment Patient Details Name: Nancy Meadows MRN: [id number] DOB: 1932-08-29 Today's Date: 06/11/2019    History of Present Illness 86 y.o. year old female with medical history significant for CKD stage III, hypothyroidism, sick sinus syndrome status post pacemaker (05/2013) CHF with preserved EF, HLD, A. fib/flutter on Eliquis and severe MR who presented on 06/08/2019 with worsening right-sided belly and lower back pain after a fall about a week ago after returning from her stay at a rehab facility after recent     PT Comments    Pt OOB in recliner with TLSO applied.  Assisted with amb to bathroom then in hallway.  Pt progressing well.  Pt able to self rise from recliner as well as from commode.  Pt is steady with walker.    Follow Up Recommendations  Home health PT     Equipment Recommendations  None recommended by PT    Recommendations for Other Services       Precautions / Restrictions Precautions Precautions: Fall;Back Precaution Comments: reviewed back precautions Required Braces or Orthoses: Spinal Brace Spinal Brace: Thoracolumbosacral orthotic;Applied in sitting position Spinal Brace Comments: for OOB    Mobility  Bed Mobility               General bed mobility comments: OOB in recliner  Transfers Overall transfer level: Needs assistance Equipment used: Rolling walker (2 wheeled) Transfers: Sit to/from Stand Sit to Stand: Supervision         General transfer comment: cues for hand placement; steady assist.  Also assisted with a toilet transfer.  Ambulation/Gait Ambulation/Gait assistance: Supervision Gait Distance (Feet): 145 Feet Assistive device: Rolling walker (2 wheeled) Gait Pattern/deviations: Step-through pattern;Decreased step length - right;Decreased step length - left;Shuffle;Trunk flexed Gait velocity: decr   General Gait Details: cues for posture and position from RW; mild general instability    Stairs              Wheelchair Mobility    Modified Rankin (Stroke Patients Only)       Balance                                            Cognition Arousal/Alertness: Awake/alert Behavior During Therapy: WFL for tasks assessed/performed Overall Cognitive Status: Within Functional Limits for tasks assessed                                 General Comments: min cues for safety      Exercises      General Comments        Pertinent Vitals/Pain Pain Assessment: Faces Faces Pain Scale: Hurts a little bit Pain Location: back Pain Descriptors / Indicators: Discomfort Pain Intervention(s): Monitored during session;Repositioned    Home Living                      Prior Function            PT Goals (current goals can now be found in the care plan section) Progress towards PT goals: Progressing toward goals    Frequency    Min 3X/week      PT Plan Current plan remains appropriate    Co-evaluation              AM-PAC PT "6 Clicks" Mobility   Outcome  Measure  Help needed turning from your back to your side while in a flat bed without using bedrails?: None Help needed moving from lying on your back to sitting on the side of a flat bed without using bedrails?: None Help needed moving to and from a bed to a chair (including a wheelchair)?: None Help needed standing up from a chair using your arms (e.g., wheelchair or bedside chair)?: None Help needed to walk in hospital room?: None Help needed climbing 3-5 steps with a railing? : A Little 6 Click Score: 23    End of Session Equipment Utilized During Treatment: Gait belt Activity Tolerance: Patient tolerated treatment well Patient left: in chair;with call bell/phone within reach Nurse Communication: Mobility status PT Visit Diagnosis: Unsteadiness on feet (R26.81);Difficulty in walking, not elsewhere classified (R26.2)     Time: 2683-4196 PT Time Calculation (min) (ACUTE  ONLY): 24 min  Charges:  $Gait Training: 8-22 mins $Therapeutic Activity: 8-22 mins                     Rica Koyanagi  PTA Acute  Rehabilitation Services Pager      [phone] Office      [phone]

## 2019-06-11 NOTE — Progress Notes (Signed)
Patient discharged to home w/ son. Given all belongings, instructions, brace. Verbalized understanding of instructions including new medication and cardiology follow up. Escorted to pov via w/c.

## 2019-06-11 NOTE — Progress Notes (Signed)
Progress Note  Patient Name: Nancy Meadows Date of Encounter: 06/11/2019  Primary Cardiologist: Thompson Grayer, MD   Subjective   Given Iv lasix 40 mg once and patient put out 1.7L urine overnight. BP this AM 115/51m rates 80-90s. Patient is anxious to get out of here.   Inpatient Medications    Scheduled Meds: . apixaban  5 mg Oral BID  . ferrous sulfate  325 mg Oral Daily  . levothyroxine  88 mcg Oral Q0600  . potassium chloride  10 mEq Oral Daily  . sodium chloride flush  3 mL Intravenous Q12H   Continuous Infusions:  PRN Meds: acetaminophen **OR** acetaminophen, ondansetron **OR** ondansetron (ZOFRAN) IV   Vital Signs    Vitals:   06/10/19 1739 06/10/19 2038 06/11/19 0500 06/11/19 0519  BP: 105/85 108/75  (!) 115/93  Pulse: 77 91  88  Resp:  16    Temp:  98.3 F (36.8 C)  97.8 F (36.6 C)  TempSrc:  Oral  Oral  SpO2: 100% 100%  96%  Weight: 61.8 kg  58.9 kg   Height:        Intake/Output Summary (Last 24 hours) at 06/11/2019 0930 Last data filed at 06/11/2019 0755 Gross per 24 hour  Intake 120 ml  Output 1950 ml  Net -1830 ml   Last 3 Weights 06/11/2019 06/10/2019 06/08/2019  Weight (lbs) 129 lb 13.6 oz 136 lb 3.9 oz 132 lb  Weight (kg) 58.9 kg 61.8 kg 59.875 kg      Telemetry    N/A - Personally Reviewed  ECG    No new- Personally Reviewed  Physical Exam   GEN: No acute distress.   Neck: No JVD Cardiac: RRR, + murmurs, rubs, or gallops.  Respiratory: Clear to auscultation bilaterally. GI: Soft, nontender, non-distended  MS: no edema; No deformity. Neuro:  Nonfocal  Psych: Normal affect   Labs    High Sensitivity Troponin:  No results for input(s): TROPONINIHS in the last 720 hours.    Chemistry Recent Labs  Lab 06/08/19 0424 06/08/19 0424 06/09/19 0307 06/10/19 0846 06/11/19 0315  NA 135   < > 136 140 139  K 3.7   < > 3.9 4.1 3.7  CL 101   < > 105 107 106  CO2 26   < > _0 GLUCOSE 107*   < > 86 101* 92  BUN 31*   < >  _1 CREATININE 1.27*   < > 0.98 0.96 0.89  CALCIUM 8.7*   < > 8.2* 8.9 8.3*  PROT 6.6  --  5.8*  --   --   ALBUMIN 2.8*  --  2.6*  --   --   AST 21  --  20  --   --   ALT 15  --  15  --   --   ALKPHOS 68  --  70  --   --   BILITOT 1.8*  --  1.7*  --   --   GFRNONAA 39*   < > 53* 54* 60*  GFRAA 45*   < > >60 >60 >60  ANIONGAP 8   < > _2 < > = values in this interval not displayed.     Hematology Recent Labs  Lab 06/08/19 2049 06/09/19 0307 06/10/19 0846  WBC 3.6* 3.7* 3.4*  RBC 3.02* 3.03* 3.24*  HGB 9.0* 8.9* 9.8*  HCT 28.8* 28.5* 31.3*  MCV 95.4 94.1 96.6  MCH 29.8 29.4 30.2  MCHC 31.3 31.2 31.3  RDW 28.6* 28.5* 29.4*  PLT 132* 126* 153    BNP Recent Labs  Lab 06/08/19 0424  BNP 660.6*     DDimer No results for input(s): DDIMER in the last 168 hours.   Radiology    No results found.  Cardiac Studies   Cardiac cath 05/14/19 Right and left heart cath with coronary angiogram for MitraClip workup.  Right A/C access for venous Findings below  Right radial access for arterial access.  Clean coronary arteries.  Right dominant system.   TEE 04/26/19 SUMMARY The left ventricular size is normal. Global LV systolic function is preserved The right ventricle is mildly dilated. There is biatrial dilation. No thrombus is detected in the left atrial appendage. There is severe mitral regurgitation. EROA 0.45 cm2 Etiology of mitral regurgitation is degenerative . There is moderate tricuspid regurgitation. Pulmonary venous flow pattern shows systolic reversal There is small size pericardial effusion. There is no prior TEE for comparison.  Manual pressure Right A/C  PreludSYNC for hemostasis to right radial  EBL<5 cc No complications.    Echo 04/23/19 SUMMARY The left ventricular size is normal. There is normal left ventricular wall thickness. LV ejection fraction = 50-55%. Left ventricular filling pattern is indeterminate. No segmental wall  motion abnormalities seen in the left ventricle The right ventricular systolic function is mildly reduced. The left atrium is severely dilated. The right atrium is moderately dilated. There is anterior mitral valve prolapse with thickened leaflet. There is severe mitral regurgitation. Eccentric MR, ERO estimated at 0.43 cm^2. There is moderate to severe tricuspid regurgitation. Moderate pulmonary hypertension. Estimated right ventricular systolic pressure is 58 mmHg. Systolic flow reversal of the hepatic vein spectral Doppler flow pattern. Trace pulmonic valvular regurgitation. The aortic sinus is normal size. IVC size was mildly dilated. There is no pericardial effusion. No comparison study.   Echo 10/2017 Study Conclusions   - Left ventricle: The cavity size was normal. Systolic function was  normal. The estimated ejection fraction was in the range of 50%  to 55%. Wall motion was normal; there were no regional wall  motion abnormalities.  - Mitral valve: Mildly thickened leaflets . There was moderate  regurgitation directed posteriorly.  - Left atrium: The atrium was mildly dilated.  - Right ventricle: Pacer wire or catheter noted in right ventricle.  - Tricuspid valve: There was mild regurgitation.  - Pericardium, extracardiac: There was a left pleural effusion.    Patient Profile     86 y.o. female with a hx of syncope resulting in SDH/SAH and loop noting symptomatic pauses/SSS and PPM placed in 01/2016, hypothyroidism, HLD, permanent afib on Eliquis, chronic diastolic CHF and severe MR who is being seen today for the evaluation of Mitral Regurgitation and CHF.  Assessment & Plan   Severe MI - currently being worked up at [facility] [place] everywhere Echo 4/9 showed EF 50-55% with anterior MVP and severe MR, ERO estimated at 0.43cm2. mod to severe TR, mod PH, RV pressure 73mHg. TTE 4/12 showed severe MI, EROA 0.45cm2, degenerative etiology. Cardiac  cath showed normal coronaries 05/14/19. - Plan to continue outpatient work-up for Mitraclip  RUL infiltrate/Previous PNA treated at [facility]/RML nodule - afebrile with no leukocytosis or cough - procal wnl - plan for repeat chest CT for RML nodule - no abx - per IM  Chronic diastolic HF/Concern for Rt sided HF/PH - Recent echo with mod PH with RVSP 58 and  -  CT abd showed Rt atrial enlargement and possible hepatic congestion - BNP 660, CXR without HF - alk phos wnl - Lasix originally held for hypotension. Patient takes Lasix 79m every other day. - pt was given gentle hydration for hypotension - given IV lasix 40 mg yesterday for mild fluid overload - she put out 1.7L urine overnight - Weight since admission is down 3 lbs - Appears euvolemic on exam  HTN - hypotension overnight and lasix and Toprol held.  - BP is much better.  - Unsure if we need restart Toprol at discharge, dose have some intermittent soft pressures. Will defer to MD   Permanent Afib - Toprol held for hypotension - rates controlled - Eliquis    For questions or updates, please contact CElmontPlease consult [email or web address] for contact info under        Signed, Jimma Ortman HNinfa Meeker PA-C  06/11/2019, 9:30 AM

## 2019-06-11 NOTE — Discharge Summary (Signed)
Nancy Meadows [id number]:[id number] DOB: [birth year] DOA: 06/08/2019  PCP: Merrilee Seashore, MD  Admit date: 06/08/2019 Discharge date: 06/11/2019  Admitted From: home Disposition:  home  Recommendations for Outpatient Follow-up:  1. Follow up with PCP in 1-2 weeks 2. Lasix 26m qd, hold metoprolol 3. Close follow up with her cardiologist   Home Health:OT/PT Equipment/Devices: TLSO brace Discharge Condition:Stable CODE STATUS:Partial  Brief/Interim Summary: History of present illness:  Nancy Meadows is a 86y.o. year old female with medical history significant for CKD stage III, hypothyroidism, sick sinus syndrome status post pacemaker (05/2013) CHF with preserved EF, HLD, A. fib/flutter on Eliquis and severe MR who presented on 06/08/2019 with worsening right-sided belly and lower back pain after a fall about a week ago after returning from her stay at a rehab facility after recent .    In the ED, patient was afebrile, hemodynamically stable a, Covid test negative, lab work notable for BUN 31, creatinine 1.27, WBC 3.7, hemoglobin 8.9, platelets 126, lipase 56, BNP 660.  Chest x-ray showed cardiac enlargement without heart failure and interval development of extensive right upper lobe infiltrate with possible cavitation and probable pneumonia.  CT abdomen showed acute compression fracture of L1 and L3 vertebra, and possible anasarca with diffuse body wall edema, as well as cardiomegaly with right atrial enlargement and sign suggestive of underlying right heart failure with hepatic congestion.  Patient has severe mitral regurgitation.  Underwent left and right heart [facility].  Ultimate plan to pursue MitraClip  [facility] secondary to community-acquired lobar pneumonia of right upper lobe (negative for strep pneumo, Legionella, Covid, mycoplasma at the time)  complicated by A. fib with RVR and hospital-acquired delirium found to have right middle lobe nodule 5 mm consistent with cavitation.   Remaining hospital course addressed in problem based format below:   Hospital Course:   Extensive right upper lobe infiltrate with possible cavitation likely sequelae from recent hospitalization for right lobar pneumonia with cavitation, stable.  Noted concern for potential pneumonia on admitting chest x-ray, but [facility] and R[facility] nodule noted at that time ( 4/30-5/7).  Currently normal respiratory effort on room air without cough,and no fever, procalcitonin less than 0.10 culture remarkable.  Maintained normal oxygen saturation on room air with ambulation  -Needs repeat CT chest in 6 to 8 weeks to reevaluate RML nodule -Encourage incentive spirometry  Hypotension with mild lactic acidosis, resolved.  Briefly needed IV fluids for SBP in the 80s during admission, blood pressure improved while holding home BP meds and given IV fluids.   Worried this could be from decreased CO from her chronic severe MR with right sided heart failure.  Cardiology evaluated during hospital course, patient's metoprolol continue to be held.  Given some mild volume overload may consider no IV fluids patient received initial diuresis with IV Lasix and responded well -Cardiology recommends continuing 40 mg of Lasix daily on discharge.  And to continue holding metoprolol given rate was within normal limits during hospital course and to avoid further hypotension  Severe mitral valve regurgitation with concern for Right sided heart failure with chronic pulmonary hypertension and chronic CHF with preserved EF, stable.  TTE (04/23/2019 with preserved EF 50-55%, moderate to severe TR, moderate pulmonary HTN with RVSP 58), TEE (04/26/2019) with moderate TR, severe MR .  Had  some noted body wall edema/anasarca on  CT abdomen with right atria enlargement and contrast in hepatic veins and possible hepatic congestion.  Bilateral ankle edema, elevated BNP in the 600s on admission, likely slight worse due to IV fluids given for hypotension earlier this admission.  Very complicated management given severe mitral valve regurgitation that ultimately needs surgical repair and difficulty diuresing given hypotension during hospital stay.  Patient was net -1.4 L after gentle diuresis with IV Lasix 40 mg x 1 and maintained normal blood pressure -Undergoing outpatient work-up with cardiology for planned mitral clip -Cardiology recommends continuing Lasix 40 mg daily on discharge with close follow-up with her primary cardiologist in the coming week -Daily weights, strict I's and O's 1   Acute L3/L1 vertebrae compression fractures.  After a fall. -PT/OT evaluation recommends home health PT/OT -TLSO brace in place, to use with sitting and exertion   HTN.   did have episodes of hypotension with SBP in the 80s earlier in hospital stay.  Blood pressure improved while holding home BP meds and timed IV fluids.  Given some concern for mild volume overload patient received IV lasix 40 mg x1 and did well.  -Continue home lasix 40 mg qd -Continue to hold home Toprol on discharge  Afib/a flutter, currently rate controlled -Continue home Eliquis -Holding home Toprol, due to recent hypotensive episodes, will discontinue on discharge and close follow up with cardiologist in a week -Diltiazem was been discontinued during previous hospital stay at [facility]  Hypothyroidism, stable -Continue home Synthroid  Normocytic anemia, chronic.  Hemoglobin stable at 8.9-9 since admission with no signs of symptoms of bleeding. -Continue home iron  CKD stage III, stable at baseline creatinine 1.1-1.2.  Consultations:  Cardiology  Procedures/Studies: None Subjective: No chest pain, no shortness of breath.  No abdominal pain.  No  fevers or chills.  No cough Discharge Exam: Vitals:   06/10/19 2038 06/11/19 0519  BP: 108/75 (!) 115/93  Pulse: 91 88  Resp: 16   Temp: 98.3 F (36.8 C) 97.8 F (36.6 C)  SpO2: 100% 96%   Vitals:   06/10/19 1739 06/10/19 2038 06/11/19 0500 06/11/19 0519  BP: 105/85 108/75  (!) 115/93  Pulse: 77 91  88  Resp:  16    Temp:  98.3 F (36.8 C)  97.8 F (36.6 C)  TempSrc:  Oral  Oral  SpO2: 100% 100%  96%  Weight: 61.8 kg  58.9 kg   Height:        Awake Alert, Oriented X 3, Normal affect No new F.N deficits,  [place].AT, Normal respiratory effort on room air, CTAB Irregular rhythm, normal rate, systolic murmur appreciated, 1+ bilateral pitting edema of lower extremities to mid calf Abdomen soft, nondistended, nontender, normal bowel sounds +ve B.Sounds, Abd Soft, No tenderness, No rebound, guarding or rigidity. No Cyanosis, No new Rash or bruise    Discharge Diagnoses:  Active Problems:   Hypothyroidism   AF (paroxysmal atrial fibrillation) (HCC)   Compression fracture of first lumbar vertebra (HCC)   Closed compression fracture of L3 lumbar vertebra, initial encounter (HCC)   Fall   Severe mitral regurgitation   Acute diastolic CHF (congestive heart failure) (HCC)   Acute on chronic diastolic CHF (congestive heart failure) [facility])    Discharge Instructions  Discharge Instructions    Diet - low sodium heart healthy   Complete by: As directed    Increase activity slowly   Complete by: As directed      Allergies  as of 06/11/2019      Reactions   Acetaminophen Other (See Comments)   Syncope   Meclizine Other (See Comments)   Increased dizziness and confusion      Medication List    STOP taking these medications   diltiazem 120 MG 24 hr capsule Commonly known as: CARDIZEM CD   metoprolol succinate 50 MG 24 hr tablet Commonly known as: Toprol XL     TAKE these medications   Cholecalciferol 25 MCG (1000 UT) tablet Take 1 tablet by mouth daily.   Eliquis 5  MG Tabs tablet Generic drug: apixaban TAKE 1 TABLET BY MOUTH TWICE A DAY What changed: how much to take   ferrous sulfate 325 (65 FE) MG tablet Take 325 mg by mouth daily.   furosemide 20 MG tablet Commonly known as: Lasix Take 2 tablets (40 mg total) by mouth daily. Take 1 tablet at 2 PM every day What changed: how much to take   levothyroxine 88 MCG tablet Commonly known as: SYNTHROID Take 88 mcg by mouth every morning.   ondansetron 4 MG tablet Commonly known as: ZOFRAN Take 4 mg by mouth 2 (two) times daily as needed for nausea or vomiting.   potassium chloride 10 MEQ tablet Commonly known as: KLOR-CON Take 1 tablet (10 mEq total) by mouth daily.   zinc gluconate 50 MG tablet Take 50 mg by mouth daily.      Follow-up Information    [facility]   Contact information: [address] [place] [address] [phone]          Allergies  Allergen Reactions  . Acetaminophen Other (See Comments)    Syncope  . Meclizine Other (See Comments)    Increased dizziness and confusion        The results of significant diagnostics from this hospitalization (including imaging, microbiology, ancillary and laboratory) are listed below for reference.     Microbiology: Recent Results (from the past 240 hour(s))  Urine culture     Status: Abnormal   Collection Time: 06/08/19  5:33 AM   Specimen: Urine, Catheterized  Result Value Ref Range Status   Specimen Description   Final    URINE, CATHETERIZED Performed at [place] [address] [place], [place] [address]    Special Requests   Final    Normal Performed at [facility], [place] [address] [place], [place] [address]    Culture MULTIPLE SPECIES PRESENT, SUGGEST RECOLLECTION (A)  Final   Report Status 06/10/2019 FINAL  Final  SARS Coronavirus 2 by RT PCR (hospital order, performed in [facility] hospital lab) Nasopharyngeal Nasopharyngeal Swab      Status: None   Collection Time: 06/08/19  9:12 AM   Specimen: Nasopharyngeal Swab  Result Value Ref Range Status   SARS Coronavirus 2 NEGATIVE NEGATIVE Final    Comment: (NOTE) SARS-CoV-2 target nucleic acids are NOT DETECTED. The SARS-CoV-2 RNA is generally detectable in upper and lower respiratory specimens during the acute phase of infection. The lowest concentration of SARS-CoV-2 viral copies this assay can detect is 250 copies / mL. A negative result does not preclude SARS-CoV-2 infection and should not be used as the sole basis for treatment or other patient management decisions.  A negative result may occur with improper specimen collection / handling, submission of specimen other than nasopharyngeal swab, presence of viral mutation(s) within the areas targeted by this assay, and inadequate number of viral copies (<250 copies /  mL). A negative result must be combined with clinical observations, patient history, and epidemiological information. Fact Sheet for Patients:   StrictlyIdeas.no Fact Sheet for Healthcare Providers: BankingDealers.co.za This test is not yet approved or cleared  by the [place] FDA and has been authorized for detection and/or diagnosis of SARS-CoV-2 by FDA under an Emergency Use Authorization (EUA).  This EUA will remain in effect (meaning this test can be used) for the duration of the COVID-19 declaration under Section 564(b)(1) of the Act, 21 U.S.C. section 360bbb-3(b)(1), unless the authorization is terminated or revoked sooner. Performed at [facility], [place] [address] [place], [place] [address]   Expectorated sputum assessment w rflx to resp cult     Status: None   Collection Time: 06/08/19  2:35 PM   Specimen: Expectorated Sputum  Result Value Ref Range Status   Specimen Description EXPECTORATED SPUTUM  Final   Special Requests NONE  Final   Sputum evaluation   Final     THIS SPECIMEN IS ACCEPTABLE FOR SPUTUM CULTURE Performed at [facility], [place] [address], [place], [place] [address]    Report Status 06/08/2019 FINAL  Final  Culture, respiratory     Status: None   Collection Time: 06/08/19  2:35 PM  Result Value Ref Range Status   Specimen Description   Final    EXPECTORATED SPUTUM Performed at [place] [address] [place], [place] [address]    Special Requests   Final    NONE Reflexed from [id number] Performed at [place] [address], [place], [place] [address]    Gram Stain   Final    RARE WBC PRESENT, PREDOMINANTLY PMN RARE GRAM POSITIVE COCCI RARE GRAM POSITIVE RODS    Culture   Final    Consistent with normal respiratory flora. Performed at [facility], [place] [address], [place],  [address]    Report Status 06/11/2019 FINAL  Final     Labs: BNP (last 3 results) Recent Labs    06/08/19 0424  BNP 425.9*   Basic Metabolic Panel: Recent Labs  Lab 06/08/19 0424 06/08/19 0956 06/09/19 0307 06/10/19 0846 06/11/19 0315  NA 135  --  136 140 139  K 3.7  --  3.9 4.1 3.7  CL 101  --  105 107 106  CO2 26  --  _0 GLUCOSE 107*  --  86 101* 92  BUN 31*  --  _1 CREATININE 1.27*  --  0.98 0.96 0.89  CALCIUM 8.7*  --  8.2* 8.9 8.3*  MG  --  1.8  --   --   --    Liver Function Tests: Recent Labs  Lab 06/08/19 0424 06/09/19 0307  AST 21 20  ALT 15 15  ALKPHOS 68 70  BILITOT 1.8* 1.7*  PROT 6.6 5.8*  ALBUMIN 2.8* 2.6*   Recent Labs  Lab 06/08/19 0424  LIPASE 56*   No results for input(s): AMMONIA in the last 168 hours. CBC: Recent Labs  Lab 06/08/19 0424 06/08/19 2049 06/09/19 0307 06/10/19 0846  WBC 3.7* 3.6* 3.7* 3.4*  NEUTROABS 2.1  --   --   --   HGB 8.9* 9.0* 8.9* 9.8*  HCT 27.9* 28.8* 28.5* 31.3*  MCV 93.3 95.4 94.1 96.6  PLT 126* 132* 126* 153   Cardiac Enzymes: No results for input(s): CKTOTAL, CKMB, CKMBINDEX,  TROPONINI in the last 168 hours. BNP: Invalid input(s): POCBNP  CBG: No results for input(s): GLUCAP in the last 168 hours. D-Dimer No results for input(s): DDIMER in the last 72 hours. Hgb A1c No results for input(s): HGBA1C in the last 72 hours. Lipid Profile No results for input(s): CHOL, HDL, LDLCALC, TRIG, CHOLHDL, LDLDIRECT in the last 72 hours. Thyroid function studies No results for input(s): TSH, T4TOTAL, T3FREE, THYROIDAB in the last 72 hours.  Invalid input(s): FREET3 Anemia work up No results for input(s): VITAMINB12, FOLATE, FERRITIN, TIBC, IRON, RETICCTPCT in the last 72 hours. Urinalysis    Component Value Date/Time   COLORURINE YELLOW 06/08/2019 0533   APPEARANCEUR CLEAR 06/08/2019 0533   LABSPEC 1.008 06/08/2019 0533   PHURINE 5.0 06/08/2019 0533   GLUCOSEU NEGATIVE 06/08/2019 0533   HGBUR MODERATE (A) 06/08/2019 0533   BILIRUBINUR NEGATIVE 06/08/2019 0533   KETONESUR NEGATIVE 06/08/2019 0533   PROTEINUR NEGATIVE 06/08/2019 0533   UROBILINOGEN 0.2 02/09/2012 0017   NITRITE NEGATIVE 06/08/2019 0533   LEUKOCYTESUR NEGATIVE 06/08/2019 0533   Sepsis Labs Invalid input(s): PROCALCITONIN,  WBC,  LACTICIDVEN Microbiology Recent Results (from the past 240 hour(s))  Urine culture     Status: Abnormal   Collection Time: 06/08/19  5:33 AM   Specimen: Urine, Catheterized  Result Value Ref Range Status   Specimen Description   Final    URINE, CATHETERIZED Performed at [facility], [place] [address] [place], [place] [address]    Special Requests   Final    Normal Performed at [facility], [place] [address], [place], [place] [address]    Culture MULTIPLE SPECIES PRESENT, SUGGEST RECOLLECTION (A)  Final   Report Status 06/10/2019 FINAL  Final  SARS Coronavirus 2 by RT PCR (hospital order, performed in [facility] hospital lab) Nasopharyngeal Nasopharyngeal Swab     Status: None   Collection Time: 06/08/19  9:12 AM   Specimen:  Nasopharyngeal Swab  Result Value Ref Range Status   SARS Coronavirus 2 NEGATIVE NEGATIVE Final    Comment: (NOTE) SARS-CoV-2 target nucleic acids are NOT DETECTED. The SARS-CoV-2 RNA is generally detectable in upper and lower respiratory specimens during the acute phase of infection. The lowest concentration of SARS-CoV-2 viral copies this assay can detect is 250 copies / mL. A negative result does not preclude SARS-CoV-2 infection and should not be used as the sole basis for treatment or other patient management decisions.  A negative result may occur with improper specimen collection / handling, submission of specimen other than nasopharyngeal swab, presence of viral mutation(s) within the areas targeted by this assay, and inadequate number of viral copies (<250 copies / mL). A negative result must be combined with clinical observations, patient history, and epidemiological information. Fact Sheet for Patients:   StrictlyIdeas.no Fact Sheet for Healthcare Providers: BankingDealers.co.za This test is not yet approved or cleared  by the [place] FDA and has been authorized for detection and/or diagnosis of SARS-CoV-2 by FDA under an Emergency Use Authorization (EUA).  This EUA will remain in effect (meaning this test can be used) for the duration of the COVID-19 declaration under Section 564(b)(1) of the Act, 21 U.S.C. section 360bbb-3(b)(1), unless the authorization is terminated or revoked sooner. Performed at [facility], [place] [address], [place], [place] [address]   Expectorated sputum assessment w rflx to resp cult     Status: None   Collection Time: 06/08/19  2:35 PM   Specimen: Expectorated Sputum  Result Value Ref Range Status   Specimen Description EXPECTORATED SPUTUM  Final   Special Requests NONE  Final   Sputum evaluation   Final    THIS SPECIMEN IS ACCEPTABLE FOR SPUTUM CULTURE Performed at  [facility], [place] [address], [place], [place] [address]    Report Status 06/08/2019 FINAL  Final  Culture, respiratory     Status: None   Collection Time: 06/08/19  2:35 PM  Result Value Ref Range Status   Specimen Description   Final    EXPECTORATED SPUTUM Performed at [place] [address] [place], [place] [address]    Special Requests   Final    NONE Reflexed from [id number] Performed at [place] [address], [place], [place] [address]    Gram Stain   Final    RARE WBC PRESENT, PREDOMINANTLY PMN RARE GRAM POSITIVE COCCI RARE GRAM POSITIVE RODS    Culture   Final    Consistent with normal respiratory flora. Performed at [facility], [place] [address] [place], [place] [address]    Report Status 06/11/2019 FINAL  Final     Time coordinating discharge: Over 30 minutes  SIGNED:   Desiree Hane, MD  [facility] 06/11/2019, 12:32 PM Pager   If 7PM-7AM, please contact night-coverage [email or web address] Password [email or web address]

## 2019-06-11 NOTE — Discharge Instructions (Signed)
Please take 40 mg ( 2 tabs) of your lasix everyday to keep the fluid off until seen by your cardiologist next week  Please stop taking your metoprolol until seen by your cardiologist next week

## 2019-06-11 NOTE — Care Management Important Message (Signed)
Important Message  Patient Details IM Letter given to the Case Manager to present to the Patient Name: Nancy Meadows MRN: [id number] Date of Birth: 02/03/1932   Medicare Important Message Given:  Yes     Kerin Salen 06/11/2019, 10:05 AM

## 2019-06-17 ENCOUNTER — Encounter: Payer: Self-pay | Admitting: Student

## 2019-06-18 DIAGNOSIS — I50810 Right heart failure, unspecified: Secondary | ICD-10-CM | POA: Diagnosis not present

## 2019-06-18 DIAGNOSIS — W19XXXD Unspecified fall, subsequent encounter: Secondary | ICD-10-CM | POA: Diagnosis not present

## 2019-06-18 DIAGNOSIS — I130 Hypertensive heart and chronic kidney disease with heart failure and stage 1 through stage 4 chronic kidney disease, or unspecified chronic kidney disease: Secondary | ICD-10-CM | POA: Diagnosis not present

## 2019-06-18 DIAGNOSIS — Z8701 Personal history of pneumonia (recurrent): Secondary | ICD-10-CM | POA: Diagnosis not present

## 2019-06-18 DIAGNOSIS — I5033 Acute on chronic diastolic (congestive) heart failure: Secondary | ICD-10-CM | POA: Diagnosis not present

## 2019-06-18 DIAGNOSIS — J9601 Acute respiratory failure with hypoxia: Secondary | ICD-10-CM | POA: Diagnosis not present

## 2019-06-18 DIAGNOSIS — I081 Rheumatic disorders of both mitral and tricuspid valves: Secondary | ICD-10-CM | POA: Diagnosis not present

## 2019-06-18 DIAGNOSIS — S32010D Wedge compression fracture of first lumbar vertebra, subsequent encounter for fracture with routine healing: Secondary | ICD-10-CM | POA: Diagnosis not present

## 2019-06-18 DIAGNOSIS — R5381 Other malaise: Secondary | ICD-10-CM | POA: Diagnosis not present

## 2019-06-18 DIAGNOSIS — S32030D Wedge compression fracture of third lumbar vertebra, subsequent encounter for fracture with routine healing: Secondary | ICD-10-CM | POA: Diagnosis not present

## 2019-06-21 DIAGNOSIS — I50810 Right heart failure, unspecified: Secondary | ICD-10-CM | POA: Diagnosis not present

## 2019-06-21 DIAGNOSIS — J9601 Acute respiratory failure with hypoxia: Secondary | ICD-10-CM | POA: Diagnosis not present

## 2019-06-21 DIAGNOSIS — I130 Hypertensive heart and chronic kidney disease with heart failure and stage 1 through stage 4 chronic kidney disease, or unspecified chronic kidney disease: Secondary | ICD-10-CM | POA: Diagnosis not present

## 2019-06-21 DIAGNOSIS — Z8701 Personal history of pneumonia (recurrent): Secondary | ICD-10-CM | POA: Diagnosis not present

## 2019-06-21 DIAGNOSIS — S32010D Wedge compression fracture of first lumbar vertebra, subsequent encounter for fracture with routine healing: Secondary | ICD-10-CM | POA: Diagnosis not present

## 2019-06-21 DIAGNOSIS — I5033 Acute on chronic diastolic (congestive) heart failure: Secondary | ICD-10-CM | POA: Diagnosis not present

## 2019-06-21 DIAGNOSIS — S32030D Wedge compression fracture of third lumbar vertebra, subsequent encounter for fracture with routine healing: Secondary | ICD-10-CM | POA: Diagnosis not present

## 2019-06-21 DIAGNOSIS — R5381 Other malaise: Secondary | ICD-10-CM | POA: Diagnosis not present

## 2019-06-21 DIAGNOSIS — I081 Rheumatic disorders of both mitral and tricuspid valves: Secondary | ICD-10-CM | POA: Diagnosis not present

## 2019-06-21 DIAGNOSIS — W19XXXD Unspecified fall, subsequent encounter: Secondary | ICD-10-CM | POA: Diagnosis not present

## 2019-06-22 DIAGNOSIS — I5042 Chronic combined systolic (congestive) and diastolic (congestive) heart failure: Secondary | ICD-10-CM | POA: Diagnosis not present

## 2019-06-24 ENCOUNTER — Telehealth: Payer: Self-pay

## 2019-06-24 DIAGNOSIS — Z7901 Long term (current) use of anticoagulants: Secondary | ICD-10-CM | POA: Diagnosis not present

## 2019-06-24 DIAGNOSIS — K591 Functional diarrhea: Secondary | ICD-10-CM | POA: Diagnosis not present

## 2019-06-24 DIAGNOSIS — S32018D Other fracture of first lumbar vertebra, subsequent encounter for fracture with routine healing: Secondary | ICD-10-CM | POA: Diagnosis not present

## 2019-06-24 DIAGNOSIS — I5032 Chronic diastolic (congestive) heart failure: Secondary | ICD-10-CM | POA: Diagnosis not present

## 2019-06-24 DIAGNOSIS — R69 Illness, unspecified: Secondary | ICD-10-CM | POA: Diagnosis not present

## 2019-06-24 DIAGNOSIS — R630 Anorexia: Secondary | ICD-10-CM | POA: Diagnosis not present

## 2019-06-24 DIAGNOSIS — I340 Nonrheumatic mitral (valve) insufficiency: Secondary | ICD-10-CM | POA: Diagnosis not present

## 2019-06-24 DIAGNOSIS — I482 Chronic atrial fibrillation, unspecified: Secondary | ICD-10-CM | POA: Diagnosis not present

## 2019-06-24 NOTE — Telephone Encounter (Signed)
Spoke with son Terie Purser to remind of missed remote transmission. Patient has moved, son states he'll work with [employer] tech support to set up monitor at new location.

## 2019-06-25 DIAGNOSIS — I5033 Acute on chronic diastolic (congestive) heart failure: Secondary | ICD-10-CM | POA: Diagnosis not present

## 2019-06-25 DIAGNOSIS — S32010D Wedge compression fracture of first lumbar vertebra, subsequent encounter for fracture with routine healing: Secondary | ICD-10-CM | POA: Diagnosis not present

## 2019-06-25 DIAGNOSIS — I130 Hypertensive heart and chronic kidney disease with heart failure and stage 1 through stage 4 chronic kidney disease, or unspecified chronic kidney disease: Secondary | ICD-10-CM | POA: Diagnosis not present

## 2019-06-25 DIAGNOSIS — Z8701 Personal history of pneumonia (recurrent): Secondary | ICD-10-CM | POA: Diagnosis not present

## 2019-06-25 DIAGNOSIS — I50810 Right heart failure, unspecified: Secondary | ICD-10-CM | POA: Diagnosis not present

## 2019-06-25 DIAGNOSIS — S32030D Wedge compression fracture of third lumbar vertebra, subsequent encounter for fracture with routine healing: Secondary | ICD-10-CM | POA: Diagnosis not present

## 2019-06-25 DIAGNOSIS — W19XXXD Unspecified fall, subsequent encounter: Secondary | ICD-10-CM | POA: Diagnosis not present

## 2019-06-25 DIAGNOSIS — I081 Rheumatic disorders of both mitral and tricuspid valves: Secondary | ICD-10-CM | POA: Diagnosis not present

## 2019-06-25 DIAGNOSIS — J9601 Acute respiratory failure with hypoxia: Secondary | ICD-10-CM | POA: Diagnosis not present

## 2019-06-25 DIAGNOSIS — R5381 Other malaise: Secondary | ICD-10-CM | POA: Diagnosis not present

## 2019-06-28 DIAGNOSIS — I130 Hypertensive heart and chronic kidney disease with heart failure and stage 1 through stage 4 chronic kidney disease, or unspecified chronic kidney disease: Secondary | ICD-10-CM | POA: Diagnosis not present

## 2019-06-28 DIAGNOSIS — I5033 Acute on chronic diastolic (congestive) heart failure: Secondary | ICD-10-CM | POA: Diagnosis not present

## 2019-06-28 DIAGNOSIS — I081 Rheumatic disorders of both mitral and tricuspid valves: Secondary | ICD-10-CM | POA: Diagnosis not present

## 2019-06-28 DIAGNOSIS — S32030D Wedge compression fracture of third lumbar vertebra, subsequent encounter for fracture with routine healing: Secondary | ICD-10-CM | POA: Diagnosis not present

## 2019-06-28 DIAGNOSIS — Z8701 Personal history of pneumonia (recurrent): Secondary | ICD-10-CM | POA: Diagnosis not present

## 2019-06-28 DIAGNOSIS — W19XXXD Unspecified fall, subsequent encounter: Secondary | ICD-10-CM | POA: Diagnosis not present

## 2019-06-28 DIAGNOSIS — I50810 Right heart failure, unspecified: Secondary | ICD-10-CM | POA: Diagnosis not present

## 2019-06-28 DIAGNOSIS — S32010D Wedge compression fracture of first lumbar vertebra, subsequent encounter for fracture with routine healing: Secondary | ICD-10-CM | POA: Diagnosis not present

## 2019-06-28 DIAGNOSIS — R5381 Other malaise: Secondary | ICD-10-CM | POA: Diagnosis not present

## 2019-06-28 DIAGNOSIS — J9601 Acute respiratory failure with hypoxia: Secondary | ICD-10-CM | POA: Diagnosis not present

## 2019-07-02 DIAGNOSIS — W19XXXD Unspecified fall, subsequent encounter: Secondary | ICD-10-CM | POA: Diagnosis not present

## 2019-07-02 DIAGNOSIS — S32010D Wedge compression fracture of first lumbar vertebra, subsequent encounter for fracture with routine healing: Secondary | ICD-10-CM | POA: Diagnosis not present

## 2019-07-02 DIAGNOSIS — I081 Rheumatic disorders of both mitral and tricuspid valves: Secondary | ICD-10-CM | POA: Diagnosis not present

## 2019-07-02 DIAGNOSIS — I130 Hypertensive heart and chronic kidney disease with heart failure and stage 1 through stage 4 chronic kidney disease, or unspecified chronic kidney disease: Secondary | ICD-10-CM | POA: Diagnosis not present

## 2019-07-02 DIAGNOSIS — I5033 Acute on chronic diastolic (congestive) heart failure: Secondary | ICD-10-CM | POA: Diagnosis not present

## 2019-07-02 DIAGNOSIS — Z8701 Personal history of pneumonia (recurrent): Secondary | ICD-10-CM | POA: Diagnosis not present

## 2019-07-02 DIAGNOSIS — S32030D Wedge compression fracture of third lumbar vertebra, subsequent encounter for fracture with routine healing: Secondary | ICD-10-CM | POA: Diagnosis not present

## 2019-07-02 DIAGNOSIS — R5381 Other malaise: Secondary | ICD-10-CM | POA: Diagnosis not present

## 2019-07-02 DIAGNOSIS — J9601 Acute respiratory failure with hypoxia: Secondary | ICD-10-CM | POA: Diagnosis not present

## 2019-07-02 DIAGNOSIS — I50810 Right heart failure, unspecified: Secondary | ICD-10-CM | POA: Diagnosis not present

## 2019-07-08 DIAGNOSIS — I081 Rheumatic disorders of both mitral and tricuspid valves: Secondary | ICD-10-CM | POA: Diagnosis not present

## 2019-07-08 DIAGNOSIS — S32030D Wedge compression fracture of third lumbar vertebra, subsequent encounter for fracture with routine healing: Secondary | ICD-10-CM | POA: Diagnosis not present

## 2019-07-08 DIAGNOSIS — Z8701 Personal history of pneumonia (recurrent): Secondary | ICD-10-CM | POA: Diagnosis not present

## 2019-07-08 DIAGNOSIS — I130 Hypertensive heart and chronic kidney disease with heart failure and stage 1 through stage 4 chronic kidney disease, or unspecified chronic kidney disease: Secondary | ICD-10-CM | POA: Diagnosis not present

## 2019-07-08 DIAGNOSIS — W19XXXD Unspecified fall, subsequent encounter: Secondary | ICD-10-CM | POA: Diagnosis not present

## 2019-07-08 DIAGNOSIS — R5381 Other malaise: Secondary | ICD-10-CM | POA: Diagnosis not present

## 2019-07-08 DIAGNOSIS — I5033 Acute on chronic diastolic (congestive) heart failure: Secondary | ICD-10-CM | POA: Diagnosis not present

## 2019-07-08 DIAGNOSIS — J9601 Acute respiratory failure with hypoxia: Secondary | ICD-10-CM | POA: Diagnosis not present

## 2019-07-08 DIAGNOSIS — I50810 Right heart failure, unspecified: Secondary | ICD-10-CM | POA: Diagnosis not present

## 2019-07-08 DIAGNOSIS — S32010D Wedge compression fracture of first lumbar vertebra, subsequent encounter for fracture with routine healing: Secondary | ICD-10-CM | POA: Diagnosis not present

## 2019-07-14 DIAGNOSIS — I5032 Chronic diastolic (congestive) heart failure: Secondary | ICD-10-CM | POA: Diagnosis not present

## 2019-07-14 DIAGNOSIS — I340 Nonrheumatic mitral (valve) insufficiency: Secondary | ICD-10-CM | POA: Diagnosis not present

## 2019-07-14 DIAGNOSIS — Z01812 Encounter for preprocedural laboratory examination: Secondary | ICD-10-CM | POA: Diagnosis not present

## 2019-07-14 DIAGNOSIS — Z01818 Encounter for other preprocedural examination: Secondary | ICD-10-CM | POA: Diagnosis not present

## 2019-07-14 DIAGNOSIS — Z20822 Contact with and (suspected) exposure to covid-19: Secondary | ICD-10-CM | POA: Diagnosis not present

## 2019-07-15 DIAGNOSIS — R5381 Other malaise: Secondary | ICD-10-CM | POA: Diagnosis not present

## 2019-07-15 DIAGNOSIS — I081 Rheumatic disorders of both mitral and tricuspid valves: Secondary | ICD-10-CM | POA: Diagnosis not present

## 2019-07-15 DIAGNOSIS — I130 Hypertensive heart and chronic kidney disease with heart failure and stage 1 through stage 4 chronic kidney disease, or unspecified chronic kidney disease: Secondary | ICD-10-CM | POA: Diagnosis not present

## 2019-07-15 DIAGNOSIS — I5033 Acute on chronic diastolic (congestive) heart failure: Secondary | ICD-10-CM | POA: Diagnosis not present

## 2019-07-15 DIAGNOSIS — W19XXXD Unspecified fall, subsequent encounter: Secondary | ICD-10-CM | POA: Diagnosis not present

## 2019-07-15 DIAGNOSIS — S32030D Wedge compression fracture of third lumbar vertebra, subsequent encounter for fracture with routine healing: Secondary | ICD-10-CM | POA: Diagnosis not present

## 2019-07-15 DIAGNOSIS — Z952 Presence of prosthetic heart valve: Secondary | ICD-10-CM | POA: Diagnosis not present

## 2019-07-15 DIAGNOSIS — I50810 Right heart failure, unspecified: Secondary | ICD-10-CM | POA: Diagnosis not present

## 2019-07-15 DIAGNOSIS — I340 Nonrheumatic mitral (valve) insufficiency: Secondary | ICD-10-CM | POA: Diagnosis not present

## 2019-07-15 DIAGNOSIS — S32010D Wedge compression fracture of first lumbar vertebra, subsequent encounter for fracture with routine healing: Secondary | ICD-10-CM | POA: Diagnosis not present

## 2019-07-16 ENCOUNTER — Other Ambulatory Visit (HOSPITAL_COMMUNITY): Payer: Self-pay | Admitting: Internal Medicine

## 2019-07-20 NOTE — Telephone Encounter (Signed)
Prescription refill request for Eliquis received.  Last office visit: 04/16/2019, [place] Scr: 0.89, 06/11/2019 Age: 86 y.o. Weight: 58.9 kg

## 2019-07-20 NOTE — Telephone Encounter (Signed)
Spoke with Rutha Bouchard D, who looked at chart recommended decreasing  pt's Eliquis dose to 2.8m BID.   Called pt and spoke to pt's son, made him aware that Eliquis dose was being decreased to 2.569mBID.   Prescription refill sent.

## 2019-07-21 DIAGNOSIS — Z006 Encounter for examination for normal comparison and control in clinical research program: Secondary | ICD-10-CM | POA: Diagnosis not present

## 2019-07-21 DIAGNOSIS — Z7982 Long term (current) use of aspirin: Secondary | ICD-10-CM | POA: Diagnosis not present

## 2019-07-21 DIAGNOSIS — I4891 Unspecified atrial fibrillation: Secondary | ICD-10-CM | POA: Diagnosis not present

## 2019-07-21 DIAGNOSIS — Z952 Presence of prosthetic heart valve: Secondary | ICD-10-CM | POA: Diagnosis not present

## 2019-07-21 DIAGNOSIS — I4819 Other persistent atrial fibrillation: Secondary | ICD-10-CM | POA: Diagnosis not present

## 2019-07-21 DIAGNOSIS — Z961 Presence of intraocular lens: Secondary | ICD-10-CM | POA: Diagnosis not present

## 2019-07-21 DIAGNOSIS — Z7901 Long term (current) use of anticoagulants: Secondary | ICD-10-CM | POA: Diagnosis not present

## 2019-07-21 DIAGNOSIS — I340 Nonrheumatic mitral (valve) insufficiency: Secondary | ICD-10-CM | POA: Diagnosis not present

## 2019-07-21 DIAGNOSIS — N183 Chronic kidney disease, stage 3 unspecified: Secondary | ICD-10-CM | POA: Diagnosis not present

## 2019-07-21 DIAGNOSIS — Z87442 Personal history of urinary calculi: Secondary | ICD-10-CM | POA: Diagnosis not present

## 2019-07-21 DIAGNOSIS — I5032 Chronic diastolic (congestive) heart failure: Secondary | ICD-10-CM | POA: Diagnosis not present

## 2019-07-21 DIAGNOSIS — Z950 Presence of cardiac pacemaker: Secondary | ICD-10-CM | POA: Diagnosis not present

## 2019-07-25 DIAGNOSIS — R001 Bradycardia, unspecified: Secondary | ICD-10-CM | POA: Diagnosis not present

## 2019-07-26 DIAGNOSIS — Z952 Presence of prosthetic heart valve: Secondary | ICD-10-CM | POA: Diagnosis not present

## 2019-07-26 DIAGNOSIS — W19XXXD Unspecified fall, subsequent encounter: Secondary | ICD-10-CM | POA: Diagnosis not present

## 2019-07-26 DIAGNOSIS — S32030D Wedge compression fracture of third lumbar vertebra, subsequent encounter for fracture with routine healing: Secondary | ICD-10-CM | POA: Diagnosis not present

## 2019-07-26 DIAGNOSIS — I5033 Acute on chronic diastolic (congestive) heart failure: Secondary | ICD-10-CM | POA: Diagnosis not present

## 2019-07-26 DIAGNOSIS — I130 Hypertensive heart and chronic kidney disease with heart failure and stage 1 through stage 4 chronic kidney disease, or unspecified chronic kidney disease: Secondary | ICD-10-CM | POA: Diagnosis not present

## 2019-07-26 DIAGNOSIS — I50810 Right heart failure, unspecified: Secondary | ICD-10-CM | POA: Diagnosis not present

## 2019-07-26 DIAGNOSIS — S32010D Wedge compression fracture of first lumbar vertebra, subsequent encounter for fracture with routine healing: Secondary | ICD-10-CM | POA: Diagnosis not present

## 2019-07-26 DIAGNOSIS — I340 Nonrheumatic mitral (valve) insufficiency: Secondary | ICD-10-CM | POA: Diagnosis not present

## 2019-07-26 DIAGNOSIS — R5381 Other malaise: Secondary | ICD-10-CM | POA: Diagnosis not present

## 2019-07-26 DIAGNOSIS — I081 Rheumatic disorders of both mitral and tricuspid valves: Secondary | ICD-10-CM | POA: Diagnosis not present

## 2019-07-27 DIAGNOSIS — I081 Rheumatic disorders of both mitral and tricuspid valves: Secondary | ICD-10-CM | POA: Diagnosis not present

## 2019-07-27 DIAGNOSIS — I50810 Right heart failure, unspecified: Secondary | ICD-10-CM | POA: Diagnosis not present

## 2019-07-27 DIAGNOSIS — R5381 Other malaise: Secondary | ICD-10-CM | POA: Diagnosis not present

## 2019-07-27 DIAGNOSIS — I340 Nonrheumatic mitral (valve) insufficiency: Secondary | ICD-10-CM | POA: Diagnosis not present

## 2019-07-27 DIAGNOSIS — S32010D Wedge compression fracture of first lumbar vertebra, subsequent encounter for fracture with routine healing: Secondary | ICD-10-CM | POA: Diagnosis not present

## 2019-07-27 DIAGNOSIS — I5033 Acute on chronic diastolic (congestive) heart failure: Secondary | ICD-10-CM | POA: Diagnosis not present

## 2019-07-27 DIAGNOSIS — S32030D Wedge compression fracture of third lumbar vertebra, subsequent encounter for fracture with routine healing: Secondary | ICD-10-CM | POA: Diagnosis not present

## 2019-07-27 DIAGNOSIS — W19XXXD Unspecified fall, subsequent encounter: Secondary | ICD-10-CM | POA: Diagnosis not present

## 2019-07-27 DIAGNOSIS — Z952 Presence of prosthetic heart valve: Secondary | ICD-10-CM | POA: Diagnosis not present

## 2019-07-27 DIAGNOSIS — I130 Hypertensive heart and chronic kidney disease with heart failure and stage 1 through stage 4 chronic kidney disease, or unspecified chronic kidney disease: Secondary | ICD-10-CM | POA: Diagnosis not present

## 2019-07-27 NOTE — Progress Notes (Deleted)
Electrophysiology Office Note Date: 07/27/2019  ID:  Nancy Meadows, DOB 06-19-1932, MRN [id number]  PCP: Merrilee Seashore, MD Primary Cardiologist: Thompson Grayer, MD  CC: Pacemaker follow-up  Nancy Meadows is a 86 y.o. female seen today for Dr. Rayann Heman for post hospital follow up.  Since discharge from hospital the patient reports doing ***.  she denies chest pain, palpitations, dyspnea, PND, orthopnea, nausea, vomiting, dizziness, syncope, edema, weight gain, or early satiety.  Device History: Medtronic Dual Chamber PPM implanted 05/2013 for SSS  Past Medical History:  Diagnosis Date  . Acute head trauma 02/09/2012  . Acute respiratory failure ([place]) 10/22/2017  . Anemia 10/22/2017  . Arthritis    ddd-- occ. pain  . Cholelithiasis   . CKD (chronic kidney disease) stage 3, GFR 30-59 ml/min 03/27/2018  . DIZZINESS 03/07/2010   Qualifier: Diagnosis of  By: Burnett Kanaris    . Dyspnea 03/27/2018  . Eczema   . Goiter   . HLD (hyperlipidemia)   . Hx of cardiovascular stress test    a. ETT-MV 7/14:  Low risk, ant defect c/w soft tissue atten., no ischemia, EF 77%  . HYPERLIPIDEMIA 03/08/2010   Qualifier: Diagnosis of  By: Stanford Breed, MD, Kandyce Rud   . Hypothyroidism 03/08/2010   Qualifier: Diagnosis of  By: Stanford Breed, MD, Kandyce Rud   . Hypoxia   . Lesion of bladder    hematuria/ frequency  . Palpitations 08/17/2012  . Paroxysmal atrial fibrillation ([place]) 01/2016   cahds2vasc sore is 3.  consider anticoagulation if afib burden increases  . Pleural effusion, bilateral 10/22/2017  . SAH (subarachnoid hemorrhage) ([place]) 02/09/2012  . SDH (subdural hematoma) ([facility])    in the setting of syncope and fall prior to PPM implant, now resolved  . Sick sinus syndrome ([place])   . Subdural hematoma, acute ([place]) 02/09/2012  . Syncope    pauses of >4 seconds   Past Surgical History:  Procedure Laterality Date  . ABDOMINAL HYSTERECTOMY  age 39  . APPENDECTOMY  age 59  .  bilateral salpingoophectomy  age 4  . CATARACT EXTRACTION W/ INTRAOCULAR LENS  IMPLANT, BILATERAL  2012  . CYSTOSCOPY WITH BIOPSY  11/21/2010   Procedure: CYSTOSCOPY WITH BIOPSY;  Surgeon: Molli Hazard, MD;  Location: [facility];  Service: Urology;  Laterality: N/A;  CYSTOSCOPY WITH BLADDER BIOPSY  AND INSTILLATION OF INDIGO CARMINE  . CYSTOSCOPY/RETROGRADE/URETEROSCOPY  11/21/2010   Procedure: CYSTOSCOPY/RETROGRADE/URETEROSCOPY;  Surgeon: Molli Hazard, MD;  Location: [facility];  Service: Urology;  Laterality: Left;  . gallstones removed  age 52  . LOOP RECORDER IMPLANT N/A 12/31/2012   Procedure: LOOP RECORDER IMPLANT;  Surgeon: Coralyn Mark, MD;  Location: [facility] CATH LAB;  Service: Cardiovascular;  Laterality: N/A;  . PACEMAKER INSERTION  05-20-2013   MDT ADDRL1 pacemaker implanted by Dr Rayann Heman for SSS and symptomatic bradycardia  . PERMANENT PACEMAKER INSERTION Left 05/20/2013   Procedure: PERMANENT PACEMAKER INSERTION;  Surgeon: Coralyn Mark, MD;  Location: [place] CATH LAB;  Service: Cardiovascular;  Laterality: Left;    Current Outpatient Medications  Medication Sig Dispense Refill  . apixaban (ELIQUIS) 2.5 MG TABS tablet Take 1 tablet (2.5 mg total) by mouth 2 (two) times daily. 60 tablet 5  . Cholecalciferol 25 MCG (1000 UT) tablet Take 1 tablet by mouth daily.    . ferrous sulfate 325 (65 FE) MG tablet Take 325 mg by mouth daily.    . furosemide (LASIX) 20 MG tablet  Take 2 tablets (40 mg total) by mouth daily. Take 1 tablet at 2 PM every day 30 tablet 11  . levothyroxine (SYNTHROID, LEVOTHROID) 88 MCG tablet Take 88 mcg by mouth every morning.     . ondansetron (ZOFRAN) 4 MG tablet Take 4 mg by mouth 2 (two) times daily as needed for nausea or vomiting.     . potassium chloride (KLOR-CON) 10 MEQ tablet Take 1 tablet (10 mEq total) by mouth daily. 90 tablet 3  . zinc gluconate 50 MG tablet Take 50 mg by mouth daily.     No current  facility-administered medications for this visit.    Allergies:   Acetaminophen and Meclizine   Social History: Social History   Socioeconomic History  . Marital status: Widowed    Spouse name: Not on file  . Number of children: Not on file  . Years of education: Not on file  . Highest education level: Not on file  Occupational History  . Not on file  Tobacco Use  . Smoking status: Never Smoker  . Smokeless tobacco: Never Used  . Tobacco comment: tobacco use - no  Vaping Use  . Vaping Use: Never used  Substance and Sexual Activity  . Alcohol use: No  . Drug use: No  . Sexual activity: Not on file  Other Topics Concern  . Not on file  Social History Narrative   Married, pt time Control and instrumentation engineer.    Social Determinants of Health   Financial Resource Strain:   . Difficulty of Paying Living Expenses:   Food Insecurity:   . Worried About Charity fundraiser in the Last Year:   . Arboriculturist in the Last Year:   Transportation Needs:   . Film/video editor (Medical):   Marland Kitchen Lack of Transportation (Non-Medical):   Physical Activity:   . Days of Exercise per Week:   . Minutes of Exercise per Session:   Stress:   . Feeling of Stress :   Social Connections:   . Frequency of Communication with Friends and Family:   . Frequency of Social Gatherings with Friends and Family:   . Attends Religious Services:   . Active Member of Clubs or Organizations:   . Attends Archivist Meetings:   Marland Kitchen Marital Status:   Intimate Partner Violence:   . Fear of Current or Ex-Partner:   . Emotionally Abused:   Marland Kitchen Physically Abused:   . Sexually Abused:     Family History: Family History  Problem Relation Age of Onset  . Congestive Heart Failure Father   . Heart disease Father   . Stroke Mother      Review of Systems: All other systems reviewed and are otherwise negative except as noted above.  Physical Exam: There were no vitals filed for this visit.   GEN- The  patient is well appearing, alert and oriented x 3 today.   HEENT: normocephalic, atraumatic; sclera clear, conjunctiva pink; hearing intact; oropharynx clear; neck supple  Lungs- Clear to ausculation bilaterally, normal work of breathing.  No wheezes, rales, rhonchi Heart- Regular rate and rhythm, no murmurs, rubs or gallops  GI- soft, non-tender, non-distended, bowel sounds present  Extremities- no clubbing, cyanosis, or edema  MS- no significant deformity or atrophy Skin- warm and dry, no rash or lesion; PPM pocket well healed Psych- euthymic mood, full affect Neuro- strength and sensation are intact  PPM Interrogation- reviewed in detail today,  See PACEART report  EKG:  EKG {  ACTION; IS/IS NGI:[id number]} ordered today. The ekg ordered today shows ***  Recent Labs: 06/08/2019: B Natriuretic Peptide 660.6; Magnesium 1.8; TSH 2.818 06/09/2019: ALT 15 06/10/2019: Hemoglobin 9.8; Platelets 153 06/11/2019: BUN 11; Creatinine, Ser 0.89; Potassium 3.7; Sodium 139   Wt Readings from Last 3 Encounters:  06/11/19 129 lb 13.6 oz (58.9 kg)  04/16/19 140 lb 6.4 oz (63.7 kg)  03/08/19 139 lb (63 kg)     Other studies Reviewed: Additional studies/ records that were reviewed today include: Previous EP office notes, Previous remote checks, Most recent labwork. Echo 10/2017 LVEF 50-55%  Assessment and Plan:  1. Sick sinus syndrome s/p Medtronic PPM  Normal PPM function See Pace Art report No changes today  2. Chronic diastolic CHF Volume status *** Continue current lasix.  Reviewed salt and fluid restriction.  3. Permanent AF Continue Eliquis for CHA2DS2VASC of at least 4 . No change.  Toprol stopped with mechanical fall/hypotension, concern for decreased output in the setting of her chronic severe R sided HF.   4. Vascular heart disease Mod MR by echo 2019 No change.   5. Health Maintenance I have encouraged her to get her second dose of COVID vaccine.  Current medicines are  reviewed at length with the patient today.   The patient {ACTIONS; HAS/DOES NOT HAVE:19233} concerns regarding her medicines.  The following changes were made today:  {NONE DEFAULTED:18576::"none"}  Labs/ tests ordered today include: *** No orders of the defined types were placed in this encounter.    Disposition:   Follow up with {Blank single:19197::"Dr. Allred","Dr. Arlan Organ. Klein","Dr. Camnitz","EP APP"} in *** {Blank single:19197::"Months","Weeks"}    Signed, Shirley Friar, PA-C  07/27/2019 3:47 PM  [address] [place] [address] [phone] (office) [phone] (fax)

## 2019-07-28 ENCOUNTER — Encounter: Payer: Medicare HMO | Admitting: Student

## 2019-07-29 ENCOUNTER — Emergency Department (HOSPITAL_COMMUNITY): Payer: Medicare HMO

## 2019-07-29 ENCOUNTER — Encounter (HOSPITAL_COMMUNITY): Payer: Self-pay | Admitting: *Deleted

## 2019-07-29 ENCOUNTER — Emergency Department (HOSPITAL_COMMUNITY)
Admission: EM | Admit: 2019-07-29 | Discharge: 2019-07-30 | Disposition: A | Discharge status: Home / Self Care | Payer: Medicare HMO | Attending: Emergency Medicine | Admitting: Emergency Medicine

## 2019-07-29 ENCOUNTER — Other Ambulatory Visit: Payer: Self-pay

## 2019-07-29 DIAGNOSIS — R634 Abnormal weight loss: Secondary | ICD-10-CM | POA: Diagnosis not present

## 2019-07-29 DIAGNOSIS — R531 Weakness: Secondary | ICD-10-CM | POA: Insufficient documentation

## 2019-07-29 DIAGNOSIS — R296 Repeated falls: Secondary | ICD-10-CM | POA: Diagnosis not present

## 2019-07-29 DIAGNOSIS — E039 Hypothyroidism, unspecified: Secondary | ICD-10-CM | POA: Insufficient documentation

## 2019-07-29 DIAGNOSIS — W19XXXD Unspecified fall, subsequent encounter: Secondary | ICD-10-CM | POA: Diagnosis not present

## 2019-07-29 DIAGNOSIS — R000 Tachycardia, unspecified: Secondary | ICD-10-CM | POA: Diagnosis not present

## 2019-07-29 DIAGNOSIS — Z950 Presence of cardiac pacemaker: Secondary | ICD-10-CM | POA: Insufficient documentation

## 2019-07-29 DIAGNOSIS — I129 Hypertensive chronic kidney disease with stage 1 through stage 4 chronic kidney disease, or unspecified chronic kidney disease: Secondary | ICD-10-CM | POA: Diagnosis not present

## 2019-07-29 DIAGNOSIS — Z7901 Long term (current) use of anticoagulants: Secondary | ICD-10-CM | POA: Diagnosis not present

## 2019-07-29 DIAGNOSIS — I50810 Right heart failure, unspecified: Secondary | ICD-10-CM | POA: Diagnosis not present

## 2019-07-29 DIAGNOSIS — I4819 Other persistent atrial fibrillation: Secondary | ICD-10-CM | POA: Diagnosis not present

## 2019-07-29 DIAGNOSIS — Z79899 Other long term (current) drug therapy: Secondary | ICD-10-CM | POA: Insufficient documentation

## 2019-07-29 DIAGNOSIS — N183 Chronic kidney disease, stage 3 unspecified: Secondary | ICD-10-CM | POA: Insufficient documentation

## 2019-07-29 DIAGNOSIS — Z952 Presence of prosthetic heart valve: Secondary | ICD-10-CM | POA: Diagnosis not present

## 2019-07-29 DIAGNOSIS — I517 Cardiomegaly: Secondary | ICD-10-CM | POA: Diagnosis not present

## 2019-07-29 DIAGNOSIS — I499 Cardiac arrhythmia, unspecified: Secondary | ICD-10-CM | POA: Diagnosis not present

## 2019-07-29 DIAGNOSIS — I5033 Acute on chronic diastolic (congestive) heart failure: Secondary | ICD-10-CM | POA: Diagnosis not present

## 2019-07-29 DIAGNOSIS — I130 Hypertensive heart and chronic kidney disease with heart failure and stage 1 through stage 4 chronic kidney disease, or unspecified chronic kidney disease: Secondary | ICD-10-CM | POA: Diagnosis not present

## 2019-07-29 DIAGNOSIS — I340 Nonrheumatic mitral (valve) insufficiency: Secondary | ICD-10-CM | POA: Diagnosis not present

## 2019-07-29 DIAGNOSIS — R630 Anorexia: Secondary | ICD-10-CM | POA: Diagnosis not present

## 2019-07-29 DIAGNOSIS — I4891 Unspecified atrial fibrillation: Secondary | ICD-10-CM | POA: Insufficient documentation

## 2019-07-29 DIAGNOSIS — R5381 Other malaise: Secondary | ICD-10-CM | POA: Diagnosis not present

## 2019-07-29 DIAGNOSIS — Z7982 Long term (current) use of aspirin: Secondary | ICD-10-CM | POA: Diagnosis not present

## 2019-07-29 DIAGNOSIS — I5032 Chronic diastolic (congestive) heart failure: Secondary | ICD-10-CM | POA: Diagnosis not present

## 2019-07-29 DIAGNOSIS — S32030D Wedge compression fracture of third lumbar vertebra, subsequent encounter for fracture with routine healing: Secondary | ICD-10-CM | POA: Diagnosis not present

## 2019-07-29 DIAGNOSIS — R69 Illness, unspecified: Secondary | ICD-10-CM | POA: Diagnosis not present

## 2019-07-29 DIAGNOSIS — S32010D Wedge compression fracture of first lumbar vertebra, subsequent encounter for fracture with routine healing: Secondary | ICD-10-CM | POA: Diagnosis not present

## 2019-07-29 DIAGNOSIS — I081 Rheumatic disorders of both mitral and tricuspid valves: Secondary | ICD-10-CM | POA: Diagnosis not present

## 2019-07-29 LAB — BASIC METABOLIC PANEL
Anion gap: 10 (ref 5–15)
BUN: 15 mg/dL (ref 8–23)
CO2: 19 mmol/L — ABNORMAL LOW (ref 22–32)
Calcium: 8.9 mg/dL (ref 8.9–10.3)
Chloride: 107 mmol/L (ref 98–111)
Creatinine, Ser: 0.98 mg/dL (ref 0.44–1.00)
GFR calc Af Amer: >60 mL/min (ref >60)
GFR calc non Af Amer: 53 mL/min — ABNORMAL LOW (ref >60)
Glucose, Bld: 124 mg/dL — ABNORMAL HIGH (ref 70–99)
Potassium: 4.4 mmol/L (ref 3.5–5.1)
Sodium: 136 mmol/L (ref 135–145)

## 2019-07-29 LAB — CBC
HCT: 28.7 % — ABNORMAL LOW (ref 36.0–46.0)
Hemoglobin: 9.2 g/dL — ABNORMAL LOW (ref 12.0–15.0)
MCH: 30.9 pg (ref 26.0–34.0)
MCHC: 32.1 g/dL (ref 30.0–36.0)
MCV: 96.3 fL (ref 80.0–100.0)
Platelets: 131 10*3/uL — ABNORMAL LOW (ref 150–400)
RBC: 2.98 MIL/uL — ABNORMAL LOW (ref 3.87–5.11)
RDW: 23 % — ABNORMAL HIGH (ref 11.5–15.5)
WBC: 5.1 10*3/uL (ref 4.0–10.5)
nRBC: 0.6 % — ABNORMAL HIGH (ref 0.0–0.2)

## 2019-07-29 LAB — MAGNESIUM: Magnesium: 2 mg/dL (ref 1.7–2.4)

## 2019-07-29 LAB — TROPONIN I (HIGH SENSITIVITY): Troponin I (High Sensitivity): 10 ng/L (ref <18)

## 2019-07-29 NOTE — ED Notes (Signed)
medtronic pacemaker interrogated

## 2019-07-29 NOTE — ED Triage Notes (Signed)
Pt arrived from home by EMS for weakness onset an hour pta. Pt reports sitting in her chair and felt nauseous with increase in pulse. Denies pain. EMS reports afib rvr with pulse of 110-150, given 575m ns enroute with improvement of rate. Pt reports feeling much better at this time and denies complaints. Recently had mitral valve clip 07/21/19 and has been off both cardizem and metoprolol "for a couple of weeks."

## 2019-07-29 NOTE — ED Provider Notes (Signed)
[facility] EMERGENCY DEPARTMENT Provider Note   CSN: [id number] Arrival date & time: 07/29/19  2020     History Chief Complaint  Patient presents with  . Weakness    Nancy Meadows is a 86 y.o. female with history of sick sinus syndrome s/p pacemaker placement, PAF on Eliquis, CKD who presents with weakness and nausea. She states she was sitting in her chair around 7PM and started to feel bad. She states she felt nauseous and generally weak. She lives alone and called EMS. They noted she was in A.fib with RVR. She was given a 500cc fluid bolus and states that she feels much better now and would like to go home. She denies lightheadedness/syncope, chest pain, SOB. She had a mitral valve clip on 7/7 and both her cardizem and metoprolol were stopped a couple weeks ago.  HPI     Past Medical History:  Diagnosis Date  . Acute head trauma 02/09/2012  . Acute respiratory failure ([place]) 10/22/2017  . Anemia 10/22/2017  . Arthritis    ddd-- occ. pain  . Cholelithiasis   . CKD (chronic kidney disease) stage 3, GFR 30-59 ml/min 03/27/2018  . DIZZINESS 03/07/2010   Qualifier: Diagnosis of  By: Burnett Kanaris    . Dyspnea 03/27/2018  . Eczema   . Goiter   . HLD (hyperlipidemia)   . Hx of cardiovascular stress test    a. ETT-MV 7/14:  Low risk, ant defect c/w soft tissue atten., no ischemia, EF 77%  . HYPERLIPIDEMIA 03/08/2010   Qualifier: Diagnosis of  By: Stanford Breed, MD, Kandyce Rud   . Hypothyroidism 03/08/2010   Qualifier: Diagnosis of  By: Stanford Breed, MD, Kandyce Rud   . Hypoxia   . Lesion of bladder    hematuria/ frequency  . Palpitations 08/17/2012  . Paroxysmal atrial fibrillation ([place]) 01/2016   cahds2vasc sore is 3.  consider anticoagulation if afib burden increases  . Pleural effusion, bilateral 10/22/2017  . SAH (subarachnoid hemorrhage) ([place]) 02/09/2012  . SDH (subdural hematoma) ([facility])    in the setting of syncope and fall prior to PPM implant,  now resolved  . Sick sinus syndrome ([place])   . Subdural hematoma, acute ([place]) 02/09/2012  . Syncope    pauses of >4 seconds    Patient Active Problem List   Diagnosis Date Noted  . Acute diastolic CHF (congestive heart failure) ([place]) 06/10/2019  . Acute on chronic diastolic CHF (congestive heart failure) ([place]) 06/10/2019  . Fall   . Severe mitral regurgitation   . Compression fracture of first lumbar vertebra ([place]) 06/08/2019  . Closed compression fracture of L3 lumbar vertebra, initial encounter ([place]) 06/08/2019  . Dyspnea 03/27/2018  . CKD (chronic kidney disease) stage 3, GFR 30-59 ml/min 03/27/2018  . AF (paroxysmal atrial fibrillation) ([place]) 03/27/2018  . Hypoxia   . Atrial fibrillation with RVR ([place])   . Acute respiratory failure ([place]) 10/22/2017  . Pleural effusion, bilateral 10/22/2017  . Anemia 10/22/2017  . Premature atrial contraction 09/05/2014  . Sick sinus syndrome ([place]) 03/07/2013  . Palpitations 08/17/2012  . Syncope 02/09/2012  . Acute head trauma 02/09/2012  . Subdural hematoma, acute ([place]) 02/09/2012  . SAH (subarachnoid hemorrhage) ([place]) 02/09/2012  . Hypothyroidism 03/08/2010  . HYPERLIPIDEMIA 03/08/2010  . CARDIAC MURMUR 03/08/2010  . CHEST PAIN 03/08/2010  . DIZZINESS 03/07/2010    Past Surgical History:  Procedure Laterality Date  . ABDOMINAL HYSTERECTOMY  age 60  . APPENDECTOMY  age 6  . bilateral  salpingoophectomy  age 21  . CATARACT EXTRACTION W/ INTRAOCULAR LENS  IMPLANT, BILATERAL  2012  . CYSTOSCOPY WITH BIOPSY  11/21/2010   Procedure: CYSTOSCOPY WITH BIOPSY;  Surgeon: Molli Hazard, MD;  Location[facility];  Service: Urology;  Laterality: N/A;  CYSTOSCOPY WITH BLADDER BIOPSY  AND INSTILLATION OF INDIGO CARMINE  . CYSTOSCOPY/RETROGRADE/URETEROSCOPY  11/21/2010   Procedure: CYSTOSCOPY/RETROGRADE/URETEROSCOPY;  Surgeon: Molli Hazard, MD;  Location: [facility];  Service: Urology;  Laterality: Left;   . gallstones removed  age 66  . LOOP RECORDER IMPLANT N/A 12/31/2012   Procedure: LOOP RECORDER IMPLANT;  Surgeon: Coralyn Mark, MD;  Location: [facility] CATH LAB;  Service: Cardiovascular;  Laterality: N/A;  . PACEMAKER INSERTION  05-20-2013   MDT ADDRL1 pacemaker implanted by Dr Rayann Heman for SSS and symptomatic bradycardia  . PERMANENT PACEMAKER INSERTION Left 05/20/2013   Procedure: PERMANENT PACEMAKER INSERTION;  Surgeon: Coralyn Mark, MD;  Location: [facility] CATH LAB;  Service: Cardiovascular;  Laterality: Left;     OB History   No obstetric history on file.     Family History  Problem Relation Age of Onset  . Congestive Heart Failure Father   . Heart disease Father   . Stroke Mother     Social History   Tobacco Use  . Smoking status: Never Smoker  . Smokeless tobacco: Never Used  . Tobacco comment: tobacco use - no  Vaping Use  . Vaping Use: Never used  Substance Use Topics  . Alcohol use: No  . Drug use: No    Home Medications Prior to Admission medications   Medication Sig Start Date End Date Taking? Authorizing Provider  apixaban (ELIQUIS) 2.5 MG TABS tablet Take 1 tablet (2.5 mg total) by mouth 2 (two) times daily. 07/20/19   Thompson Grayer, MD  Cholecalciferol 25 MCG (1000 UT) tablet Take 1 tablet by mouth daily.    [provider]  ferrous sulfate 325 (65 FE) MG tablet Take 325 mg by mouth daily. 04/20/19   [provider]  furosemide (LASIX) 20 MG tablet Take 2 tablets (40 mg total) by mouth daily. Take 1 tablet at 2 PM every day 06/11/19 06/10/20  Oretha Milch D, MD  levothyroxine (SYNTHROID, LEVOTHROID) 88 MCG tablet Take 88 mcg by mouth every morning.     [provider]  ondansetron (ZOFRAN) 4 MG tablet Take 4 mg by mouth 2 (two) times daily as needed for nausea or vomiting.  05/21/19   [provider]  potassium chloride (KLOR-CON) 10 MEQ tablet Take 1 tablet (10 mEq total) by mouth daily. 12/08/18   Shirley Friar, PA-C  zinc  gluconate 50 MG tablet Take 50 mg by mouth daily.    [provider]    Allergies    Acetaminophen and Meclizine  Review of Systems   Review of Systems  Constitutional: Negative for chills and fever.  Respiratory: Negative for shortness of breath.   Cardiovascular: Negative for chest pain.  Gastrointestinal: Positive for nausea. Negative for abdominal pain and vomiting.  Neurological: Positive for weakness. Negative for headaches.  All other systems reviewed and are negative.   Physical Exam Updated Vital Signs BP 116/81   Pulse 92   Temp 98.3 F (36.8 C)   Resp 16   Ht 5' 7.5" (1.715 m)   Wt 54.4 kg   SpO2 100%   BMI 18.52 kg/m   Physical Exam Vitals and nursing note reviewed.  Constitutional:  General: She is not in acute distress.    Appearance: Normal appearance. She is well-developed. She is not ill-appearing.     Comments: Frail elderly female in NAD  HENT:     Head: Normocephalic and atraumatic.  Eyes:     General: No scleral icterus.       Right eye: No discharge.        Left eye: No discharge.     Conjunctiva/sclera: Conjunctivae normal.     Pupils: Pupils are equal, round, and reactive to light.  Cardiovascular:     Rate and Rhythm: Normal rate. Rhythm irregularly irregular.     Comments: Rate controlled A.fib Pulmonary:     Effort: Pulmonary effort is normal. No respiratory distress.     Breath sounds: Normal breath sounds.  Abdominal:     General: There is no distension.     Palpations: Abdomen is soft.     Tenderness: There is no abdominal tenderness.  Musculoskeletal:     Cervical back: Normal range of motion.  Skin:    General: Skin is warm and dry.  Neurological:     Mental Status: She is alert and oriented to person, place, and time.  Psychiatric:        Behavior: Behavior normal.     ED Results / Procedures / Treatments   Labs (all labs ordered are listed, but only abnormal results are displayed) Labs Reviewed  BASIC  METABOLIC PANEL - Abnormal; Notable for the following components:      Result Value   CO2 19 (*)    Glucose, Bld 124 (*)    GFR calc non Af Amer 53 (*)    All other components within normal limits  CBC - Abnormal; Notable for the following components:   RBC 2.98 (*)    Hemoglobin 9.2 (*)    HCT 28.7 (*)    RDW 23.0 (*)    Platelets 131 (*)    nRBC 0.6 (*)    All other components within normal limits  MAGNESIUM  TROPONIN I (HIGH SENSITIVITY)  TROPONIN I (HIGH SENSITIVITY)    EKG None  Radiology DG Chest Port 1 View  Result Date: 07/29/2019 CLINICAL DATA:  Weakness, nausea, tachycardia EXAM: PORTABLE CHEST 1 VIEW COMPARISON:  06/08/2019 FINDINGS: Single frontal view of the chest demonstrates stable dual lead pacemaker. The cardiac silhouette remains enlarged. There is persistent right upper lobe consolidation with volume loss, with slight improvement since prior study. No effusion or pneumothorax. No acute bony abnormalities. IMPRESSION: 1. Persistent but improving right upper lobe consolidation. The appearance is consistent with a resolving pneumonia. Please note that it may take 6-8 weeks for complete radiographic resolution after completion of appropriate medical management. 2. Stable cardiomegaly. Electronically Signed   By: Randa Ngo M.D.   On: 07/29/2019 22:10    Procedures Procedures (including critical care time)  Medications Ordered in ED Medications - No data to display  ED Course  I have reviewed the triage vital signs and the nursing notes.  Pertinent labs & imaging results that were available during my care of the patient were reviewed by me and considered in my medical decision making (see chart for details).  86 year old female presents with acute weakness and nausea this evening while sitting in her chair. EMS found her to be in A.fib with RVR. After fluids were given her rate normalized and on my exam she feels well. She is alert and oriented. Heart is  irregularly irregular with rate between  70-90s. Lungs are CTA. Abdomen is soft and non-tender. There is no significant lower extremity edema. Pt has no complaints. Initial EKG is rate controlled A.fib with PVCs. Will obtain labs, CXR and interrogate pacer  At shift change work up is still pending. Initial labs are very reassuring. Initial trop is 10. Shared visit with Dr. Ron Parker who spoke with the patient's son. Plan is to check 2nd troponin and if reassuring and pt continues to feel well she can be discharged home.   MDM Rules/Calculators/A&P                          Final Clinical Impression(s) / ED Diagnoses Final diagnoses:  Weakness  Atrial fibrillation, unspecified type [facility])    Rx / DC Orders ED Discharge Orders    None       Recardo Evangelist, PA-C 07/30/19 1034    Breck Coons, MD 07/30/19 1451

## 2019-07-30 LAB — TROPONIN I (HIGH SENSITIVITY): Troponin I (High Sensitivity): 16 ng/L (ref <18)

## 2019-07-30 NOTE — ED Notes (Signed)
Called Rozell Searing (son) for transportation home.

## 2019-08-02 DIAGNOSIS — S32010D Wedge compression fracture of first lumbar vertebra, subsequent encounter for fracture with routine healing: Secondary | ICD-10-CM | POA: Diagnosis not present

## 2019-08-02 DIAGNOSIS — I340 Nonrheumatic mitral (valve) insufficiency: Secondary | ICD-10-CM | POA: Diagnosis not present

## 2019-08-02 DIAGNOSIS — I130 Hypertensive heart and chronic kidney disease with heart failure and stage 1 through stage 4 chronic kidney disease, or unspecified chronic kidney disease: Secondary | ICD-10-CM | POA: Diagnosis not present

## 2019-08-02 DIAGNOSIS — Z952 Presence of prosthetic heart valve: Secondary | ICD-10-CM | POA: Diagnosis not present

## 2019-08-02 DIAGNOSIS — I5033 Acute on chronic diastolic (congestive) heart failure: Secondary | ICD-10-CM | POA: Diagnosis not present

## 2019-08-02 DIAGNOSIS — S32030D Wedge compression fracture of third lumbar vertebra, subsequent encounter for fracture with routine healing: Secondary | ICD-10-CM | POA: Diagnosis not present

## 2019-08-02 DIAGNOSIS — I081 Rheumatic disorders of both mitral and tricuspid valves: Secondary | ICD-10-CM | POA: Diagnosis not present

## 2019-08-02 DIAGNOSIS — R5381 Other malaise: Secondary | ICD-10-CM | POA: Diagnosis not present

## 2019-08-02 DIAGNOSIS — I50810 Right heart failure, unspecified: Secondary | ICD-10-CM | POA: Diagnosis not present

## 2019-08-02 DIAGNOSIS — W19XXXD Unspecified fall, subsequent encounter: Secondary | ICD-10-CM | POA: Diagnosis not present

## 2019-08-04 ENCOUNTER — Encounter (HOSPITAL_COMMUNITY): Payer: Self-pay | Admitting: Physician Assistant

## 2019-08-04 ENCOUNTER — Other Ambulatory Visit: Payer: Self-pay

## 2019-08-04 ENCOUNTER — Ambulatory Visit (HOSPITAL_COMMUNITY)
Admission: RE | Admit: 2019-08-04 | Discharge: 2019-08-04 | Disposition: A | Discharge status: Home / Self Care | Payer: Medicare HMO | Source: Ambulatory Visit | Attending: Physician Assistant | Admitting: Physician Assistant

## 2019-08-04 VITALS — BP 118/68 | HR 120 | Ht 67.5 in | Wt 129.4 lb

## 2019-08-04 DIAGNOSIS — E039 Hypothyroidism, unspecified: Secondary | ICD-10-CM | POA: Diagnosis not present

## 2019-08-04 DIAGNOSIS — Z7901 Long term (current) use of anticoagulants: Secondary | ICD-10-CM | POA: Insufficient documentation

## 2019-08-04 DIAGNOSIS — I495 Sick sinus syndrome: Secondary | ICD-10-CM | POA: Insufficient documentation

## 2019-08-04 DIAGNOSIS — S32030D Wedge compression fracture of third lumbar vertebra, subsequent encounter for fracture with routine healing: Secondary | ICD-10-CM | POA: Diagnosis not present

## 2019-08-04 DIAGNOSIS — Z950 Presence of cardiac pacemaker: Secondary | ICD-10-CM | POA: Diagnosis not present

## 2019-08-04 DIAGNOSIS — Z79899 Other long term (current) drug therapy: Secondary | ICD-10-CM | POA: Insufficient documentation

## 2019-08-04 DIAGNOSIS — D6869 Other thrombophilia: Secondary | ICD-10-CM | POA: Diagnosis not present

## 2019-08-04 DIAGNOSIS — Z823 Family history of stroke: Secondary | ICD-10-CM | POA: Diagnosis not present

## 2019-08-04 DIAGNOSIS — Z888 Allergy status to other drugs, medicaments and biological substances status: Secondary | ICD-10-CM | POA: Diagnosis not present

## 2019-08-04 DIAGNOSIS — Z79890 Hormone replacement therapy (postmenopausal): Secondary | ICD-10-CM | POA: Insufficient documentation

## 2019-08-04 DIAGNOSIS — E785 Hyperlipidemia, unspecified: Secondary | ICD-10-CM | POA: Insufficient documentation

## 2019-08-04 DIAGNOSIS — Z886 Allergy status to analgesic agent status: Secondary | ICD-10-CM | POA: Diagnosis not present

## 2019-08-04 DIAGNOSIS — N183 Chronic kidney disease, stage 3 unspecified: Secondary | ICD-10-CM | POA: Insufficient documentation

## 2019-08-04 DIAGNOSIS — R5381 Other malaise: Secondary | ICD-10-CM | POA: Diagnosis not present

## 2019-08-04 DIAGNOSIS — I5032 Chronic diastolic (congestive) heart failure: Secondary | ICD-10-CM | POA: Diagnosis not present

## 2019-08-04 DIAGNOSIS — I340 Nonrheumatic mitral (valve) insufficiency: Secondary | ICD-10-CM | POA: Diagnosis not present

## 2019-08-04 DIAGNOSIS — I129 Hypertensive chronic kidney disease with stage 1 through stage 4 chronic kidney disease, or unspecified chronic kidney disease: Secondary | ICD-10-CM | POA: Diagnosis not present

## 2019-08-04 DIAGNOSIS — Z8249 Family history of ischemic heart disease and other diseases of the circulatory system: Secondary | ICD-10-CM | POA: Insufficient documentation

## 2019-08-04 DIAGNOSIS — I50810 Right heart failure, unspecified: Secondary | ICD-10-CM | POA: Diagnosis not present

## 2019-08-04 DIAGNOSIS — I081 Rheumatic disorders of both mitral and tricuspid valves: Secondary | ICD-10-CM | POA: Diagnosis not present

## 2019-08-04 DIAGNOSIS — Z952 Presence of prosthetic heart valve: Secondary | ICD-10-CM | POA: Diagnosis not present

## 2019-08-04 DIAGNOSIS — I4821 Permanent atrial fibrillation: Secondary | ICD-10-CM | POA: Insufficient documentation

## 2019-08-04 DIAGNOSIS — W19XXXD Unspecified fall, subsequent encounter: Secondary | ICD-10-CM | POA: Diagnosis not present

## 2019-08-04 DIAGNOSIS — I5033 Acute on chronic diastolic (congestive) heart failure: Secondary | ICD-10-CM | POA: Diagnosis not present

## 2019-08-04 DIAGNOSIS — S32010D Wedge compression fracture of first lumbar vertebra, subsequent encounter for fracture with routine healing: Secondary | ICD-10-CM | POA: Diagnosis not present

## 2019-08-04 DIAGNOSIS — I130 Hypertensive heart and chronic kidney disease with heart failure and stage 1 through stage 4 chronic kidney disease, or unspecified chronic kidney disease: Secondary | ICD-10-CM | POA: Diagnosis not present

## 2019-08-04 NOTE — Progress Notes (Signed)
Primary Care Physician: Merrilee Seashore, MD Lyda Kalata. Maricela Bo [facility]) Primary Electrophysiologist: Dr Rayann Heman Referring Physician: [facility]   Nobie Putnam Nancy Meadows is a 86 y.o. female with a history of syncope with pauses/SSS s/p PPM 01/2016, hypothyroidism, HLD, permanent afib on Eliquis, chronic diastolic CHF and severe MR s/p Mitra clip at [facility] 07/21/19 who presents for follow up in the [facility]. Patient is on Eliquis for a CHADS2VASC score of 4. Her rate controlling medications we discontinued during her two hospitalizations this year 2/2 hypotension and falls. She was in her usual health until the night of 07/29/19 when she started feeling nauseous and weak. She called EMS and noted she was in afib with RVR. Her rate slowed with a fluid bolus and her symptoms resolved. She reports that she continues to have intermittent symptoms of heart racing. Her family reports her pulse rate can range from the 70s-120s.   Today, she denies symptoms of chest pain, shortness of breath, orthopnea, PND, lower extremity edema, dizziness, presyncope, syncope, snoring, daytime somnolence, bleeding, or neurologic sequela. The patient is tolerating medications without difficulties and is otherwise without complaint today.    Atrial Fibrillation Risk Factors:  she does not have symptoms or diagnosis of sleep apnea.   she has a BMI of Body mass index is 19.97 kg/m.Marland Kitchen Filed Weights   08/04/19 1435  Weight: 58.7 kg    Family History  Problem Relation Age of Onset  . Congestive Heart Failure Father   . Heart disease Father   . Stroke Mother      Atrial Fibrillation Management history:  Previous antiarrhythmic drugs: none Previous cardioversions: none Previous ablations: none CHADS2VASC score: 4 Anticoagulation history: Eliquis    Past Medical History:  Diagnosis Date  . Acute head trauma 02/09/2012  . Acute respiratory failure ([place]) 10/22/2017  . Anemia 10/22/2017    . Arthritis    ddd-- occ. pain  . Cholelithiasis   . CKD (chronic kidney disease) stage 3, GFR 30-59 ml/min 03/27/2018  . DIZZINESS 03/07/2010   Qualifier: Diagnosis of  By: Burnett Kanaris    . Dyspnea 03/27/2018  . Eczema   . Goiter   . HLD (hyperlipidemia)   . Hx of cardiovascular stress test    a. ETT-MV 7/14:  Low risk, ant defect c/w soft tissue atten., no ischemia, EF 77%  . HYPERLIPIDEMIA 03/08/2010   Qualifier: Diagnosis of  By: Stanford Breed, MD, Kandyce Rud   . Hypothyroidism 03/08/2010   Qualifier: Diagnosis of  By: Stanford Breed, MD, Kandyce Rud   . Hypoxia   . Lesion of bladder    hematuria/ frequency  . Palpitations 08/17/2012  . Paroxysmal atrial fibrillation ([place]) 01/2016   cahds2vasc sore is 3.  consider anticoagulation if afib burden increases  . Pleural effusion, bilateral 10/22/2017  . SAH (subarachnoid hemorrhage) ([place]) 02/09/2012  . SDH (subdural hematoma) ([facility])    in the setting of syncope and fall prior to PPM implant, now resolved  . Sick sinus syndrome ([place])   . Subdural hematoma, acute ([place]) 02/09/2012  . Syncope    pauses of >4 seconds   Past Surgical History:  Procedure Laterality Date  . ABDOMINAL HYSTERECTOMY  age 76  . APPENDECTOMY  age 50  . bilateral salpingoophectomy  age 49  . CATARACT EXTRACTION W/ INTRAOCULAR LENS  IMPLANT, BILATERAL  2012  . CYSTOSCOPY WITH BIOPSY  11/21/2010   Procedure: CYSTOSCOPY WITH BIOPSY;  Surgeon: Molli Hazard, MD;  Location: [facility];  Service: Urology;  Laterality: N/A;  CYSTOSCOPY WITH BLADDER BIOPSY  AND INSTILLATION OF INDIGO CARMINE  . CYSTOSCOPY/RETROGRADE/URETEROSCOPY  11/21/2010   Procedure: CYSTOSCOPY/RETROGRADE/URETEROSCOPY;  Surgeon: Molli Hazard, MD;  Location: [facility];  Service: Urology;  Laterality: Left;  . gallstones removed  age 72  . LOOP RECORDER IMPLANT N/A 12/31/2012   Procedure: LOOP RECORDER IMPLANT;  Surgeon: Coralyn Mark, MD;   Location: [facility];  Service: Cardiovascular;  Laterality: N/A;  . PACEMAKER INSERTION  05-20-2013   MDT ADDRL1 pacemaker implanted by Dr Rayann Heman for SSS and symptomatic bradycardia  . PERMANENT PACEMAKER INSERTION Left 05/20/2013   Procedure: PERMANENT PACEMAKER INSERTION;  Surgeon: Coralyn Mark, MD;  Location: [facility];  Service: Cardiovascular;  Laterality: Left;    Current Outpatient Medications  Medication Sig Dispense Refill  . apixaban (ELIQUIS) 2.5 MG TABS tablet Take 1 tablet (2.5 mg total) by mouth 2 (two) times daily. 60 tablet 5  . aspirin EC 81 MG tablet Take 81 mg by mouth daily. Swallow whole.    . levothyroxine (SYNTHROID, LEVOTHROID) 88 MCG tablet Take 88 mcg by mouth every morning.     . mirtazapine (REMERON) 7.5 MG tablet Take 7.5 mg by mouth at bedtime.     No current facility-administered medications for this encounter.    Allergies  Allergen Reactions  . Acetaminophen Other (See Comments)    Syncope  . Meclizine Other (See Comments)    Increased dizziness and confusion    Social History   Socioeconomic History  . Marital status: Widowed    Spouse name: Not on file  . Number of children: Not on file  . Years of education: Not on file  . Highest education level: Not on file  Occupational History  . Not on file  Tobacco Use  . Smoking status: Never Smoker  . Smokeless tobacco: Never Used  . Tobacco comment: tobacco use - no  Vaping Use  . Vaping Use: Never used  Substance and Sexual Activity  . Alcohol use: No  . Drug use: No  . Sexual activity: Not on file  Other Topics Concern  . Not on file  Social History Narrative   Married, pt time Control and instrumentation engineer.    Social Determinants of Health   Financial Resource Strain:   . Difficulty of Paying Living Expenses:   Food Insecurity:   . Worried About Charity fundraiser in the Last Year:   . Arboriculturist in the Last Year:   Transportation Needs:   . Film/video editor (Medical):   Marland Kitchen  Lack of Transportation (Non-Medical):   Physical Activity:   . Days of Exercise per Week:   . Minutes of Exercise per Session:   Stress:   . Feeling of Stress :   Social Connections:   . Frequency of Communication with Friends and Family:   . Frequency of Social Gatherings with Friends and Family:   . Attends Religious Services:   . Active Member of Clubs or Organizations:   . Attends Archivist Meetings:   Marland Kitchen Marital Status:   Intimate Partner Violence:   . Fear of Current or Ex-Partner:   . Emotionally Abused:   Marland Kitchen Physically Abused:   . Sexually Abused:      ROS- All systems are reviewed and negative except as per the HPI above.  Physical Exam: Vitals:   08/04/19 1435  BP: 118/68  Pulse: (!) 120  Weight:  58.7 kg  Height: 5' 7.5" (1.715 m)    GEN- The patient is well appearing elderly female, alert and oriented x 3 today.   Head- normocephalic, atraumatic Eyes-  Sclera clear, conjunctiva pink Ears- hearing intact Oropharynx- clear Neck- supple  Lungs- Clear to ausculation bilaterally, normal work of breathing Heart- irregular rate and rhythm, tachycardia, no murmurs, rubs or gallops  GI- soft, NT, ND, + BS Extremities- no clubbing, cyanosis, or edema MS- no significant deformity or atrophy Skin- no rash or lesion Psych- euthymic mood, full affect Neuro- strength and sensation are intact  Wt Readings from Last 3 Encounters:  08/04/19 58.7 kg  07/29/19 54.4 kg  06/11/19 58.9 kg    EKG today demonstrates afib HR 120, PVCs, QRS 88, QTc 401  Echo 10/23/17 demonstrated  - Left ventricle: The cavity size was normal. Systolic function was  normal. The estimated ejection fraction was in the range of 50%  to 55%. Wall motion was normal; there were no regional wall  motion abnormalities.  - Mitral valve: Mildly thickened leaflets . There was moderate  regurgitation directed posteriorly.  - Left atrium: The atrium was mildly dilated.  - Right  ventricle: Pacer wire or catheter noted in right ventricle.  - Tricuspid valve: There was mild regurgitation.  - Pericardium, extracardiac: There was a left pleural effusion.   Epic records are reviewed at length today  CHA2DS2-VASc Score = 4  The patient's score is based upon: CHF History: 1 HTN History: 0 Age : 2 Diabetes History: 0 Stroke History: 0 Vascular Disease History: 0 Gender: 1      ASSESSMENT AND PLAN: 1. Permanent Atrial Fibrillation (ICD10:  I48.11) The patient's CHA2DS2-VASc score is 4, indicating a 4.8% annual risk of stroke.   Patient with rapid rates intolerant of rate controlling medications 2/2 hypotension.  We discussed possibility of AV node ablation today and she is agreeable to evaluation.  Continue Eliquis 2.5 mg BID  2. Secondary Hypercoagulable State (ICD10:  D68.69) The patient is at significant risk for stroke/thromboembolism based upon her CHA2DS2-VASc Score of 4.  Continue Apixaban (Eliquis).   3. Mitral regurgitation  S/p Mitraclip at [facility] 07/21/19.  4. HTN Stable, no changes today.  5. SSS S/p PPM, followed by Dr Rayann Heman and the device clinic.   Follow up with Dr Rayann Heman to discuss AV node ablation.    [facility] [address] [place], [place] [address] [phone] 08/04/2019 3:14 PM

## 2019-08-06 DIAGNOSIS — H3561 Retinal hemorrhage, right eye: Secondary | ICD-10-CM | POA: Diagnosis not present

## 2019-08-06 DIAGNOSIS — H43813 Vitreous degeneration, bilateral: Secondary | ICD-10-CM | POA: Diagnosis not present

## 2019-08-06 DIAGNOSIS — H35362 Drusen (degenerative) of macula, left eye: Secondary | ICD-10-CM | POA: Diagnosis not present

## 2019-08-06 DIAGNOSIS — H34831 Tributary (branch) retinal vein occlusion, right eye, with macular edema: Secondary | ICD-10-CM | POA: Diagnosis not present

## 2019-08-09 ENCOUNTER — Telehealth (HOSPITAL_COMMUNITY): Payer: Self-pay

## 2019-08-09 NOTE — Telephone Encounter (Signed)
Pt insurance is active and benefits verified through [facility]. Co-pay $45.00, DED $0.00/$0.00 met, out of pocket $5,000.00/$3,608.77 met, co-insurance 0%. No pre-authorization required. Nikki/[employer] Medicare, 08/09/19 @ 4PM, EJF#[id number]  Will contact patient to see if she is interested in the Cardiac Rehab Program.

## 2019-08-09 NOTE — Telephone Encounter (Signed)
Attempted to call patient in regards to Cardiac Rehab - unable to leave VM

## 2019-08-12 DIAGNOSIS — W19XXXD Unspecified fall, subsequent encounter: Secondary | ICD-10-CM | POA: Diagnosis not present

## 2019-08-12 DIAGNOSIS — Z952 Presence of prosthetic heart valve: Secondary | ICD-10-CM | POA: Diagnosis not present

## 2019-08-12 DIAGNOSIS — I130 Hypertensive heart and chronic kidney disease with heart failure and stage 1 through stage 4 chronic kidney disease, or unspecified chronic kidney disease: Secondary | ICD-10-CM | POA: Diagnosis not present

## 2019-08-12 DIAGNOSIS — S32030D Wedge compression fracture of third lumbar vertebra, subsequent encounter for fracture with routine healing: Secondary | ICD-10-CM | POA: Diagnosis not present

## 2019-08-12 DIAGNOSIS — I50810 Right heart failure, unspecified: Secondary | ICD-10-CM | POA: Diagnosis not present

## 2019-08-12 DIAGNOSIS — I340 Nonrheumatic mitral (valve) insufficiency: Secondary | ICD-10-CM | POA: Diagnosis not present

## 2019-08-12 DIAGNOSIS — S32010D Wedge compression fracture of first lumbar vertebra, subsequent encounter for fracture with routine healing: Secondary | ICD-10-CM | POA: Diagnosis not present

## 2019-08-12 DIAGNOSIS — R5381 Other malaise: Secondary | ICD-10-CM | POA: Diagnosis not present

## 2019-08-12 DIAGNOSIS — I081 Rheumatic disorders of both mitral and tricuspid valves: Secondary | ICD-10-CM | POA: Diagnosis not present

## 2019-08-12 DIAGNOSIS — I5033 Acute on chronic diastolic (congestive) heart failure: Secondary | ICD-10-CM | POA: Diagnosis not present

## 2019-08-18 DIAGNOSIS — I4891 Unspecified atrial fibrillation: Secondary | ICD-10-CM | POA: Diagnosis not present

## 2019-08-18 DIAGNOSIS — N189 Chronic kidney disease, unspecified: Secondary | ICD-10-CM | POA: Diagnosis not present

## 2019-08-18 DIAGNOSIS — I482 Chronic atrial fibrillation, unspecified: Secondary | ICD-10-CM | POA: Diagnosis not present

## 2019-08-18 DIAGNOSIS — Z7901 Long term (current) use of anticoagulants: Secondary | ICD-10-CM | POA: Diagnosis not present

## 2019-08-18 DIAGNOSIS — N1831 Chronic kidney disease, stage 3a: Secondary | ICD-10-CM | POA: Diagnosis not present

## 2019-08-18 DIAGNOSIS — I5032 Chronic diastolic (congestive) heart failure: Secondary | ICD-10-CM | POA: Diagnosis not present

## 2019-08-18 DIAGNOSIS — Z79899 Other long term (current) drug therapy: Secondary | ICD-10-CM | POA: Diagnosis not present

## 2019-08-18 DIAGNOSIS — D631 Anemia in chronic kidney disease: Secondary | ICD-10-CM | POA: Diagnosis not present

## 2019-08-19 DIAGNOSIS — I272 Pulmonary hypertension, unspecified: Secondary | ICD-10-CM | POA: Diagnosis not present

## 2019-08-19 DIAGNOSIS — R0609 Other forms of dyspnea: Secondary | ICD-10-CM | POA: Diagnosis not present

## 2019-08-19 DIAGNOSIS — I509 Heart failure, unspecified: Secondary | ICD-10-CM | POA: Diagnosis not present

## 2019-08-19 DIAGNOSIS — Z7901 Long term (current) use of anticoagulants: Secondary | ICD-10-CM | POA: Diagnosis not present

## 2019-08-19 DIAGNOSIS — Z7982 Long term (current) use of aspirin: Secondary | ICD-10-CM | POA: Diagnosis not present

## 2019-08-19 DIAGNOSIS — Z79899 Other long term (current) drug therapy: Secondary | ICD-10-CM | POA: Diagnosis not present

## 2019-08-19 DIAGNOSIS — I340 Nonrheumatic mitral (valve) insufficiency: Secondary | ICD-10-CM | POA: Diagnosis not present

## 2019-08-19 DIAGNOSIS — Z952 Presence of prosthetic heart valve: Secondary | ICD-10-CM | POA: Diagnosis not present

## 2019-08-19 DIAGNOSIS — I361 Nonrheumatic tricuspid (valve) insufficiency: Secondary | ICD-10-CM | POA: Diagnosis not present

## 2019-08-20 DIAGNOSIS — I081 Rheumatic disorders of both mitral and tricuspid valves: Secondary | ICD-10-CM | POA: Diagnosis not present

## 2019-08-20 DIAGNOSIS — S32030D Wedge compression fracture of third lumbar vertebra, subsequent encounter for fracture with routine healing: Secondary | ICD-10-CM | POA: Diagnosis not present

## 2019-08-20 DIAGNOSIS — Z952 Presence of prosthetic heart valve: Secondary | ICD-10-CM | POA: Diagnosis not present

## 2019-08-20 DIAGNOSIS — I340 Nonrheumatic mitral (valve) insufficiency: Secondary | ICD-10-CM | POA: Diagnosis not present

## 2019-08-20 DIAGNOSIS — W19XXXD Unspecified fall, subsequent encounter: Secondary | ICD-10-CM | POA: Diagnosis not present

## 2019-08-20 DIAGNOSIS — S32010D Wedge compression fracture of first lumbar vertebra, subsequent encounter for fracture with routine healing: Secondary | ICD-10-CM | POA: Diagnosis not present

## 2019-08-20 DIAGNOSIS — I50810 Right heart failure, unspecified: Secondary | ICD-10-CM | POA: Diagnosis not present

## 2019-08-20 DIAGNOSIS — R5381 Other malaise: Secondary | ICD-10-CM | POA: Diagnosis not present

## 2019-08-20 DIAGNOSIS — I130 Hypertensive heart and chronic kidney disease with heart failure and stage 1 through stage 4 chronic kidney disease, or unspecified chronic kidney disease: Secondary | ICD-10-CM | POA: Diagnosis not present

## 2019-08-20 DIAGNOSIS — I5033 Acute on chronic diastolic (congestive) heart failure: Secondary | ICD-10-CM | POA: Diagnosis not present

## 2019-08-22 ENCOUNTER — Emergency Department (HOSPITAL_COMMUNITY): Payer: Medicare HMO

## 2019-08-22 ENCOUNTER — Encounter (HOSPITAL_COMMUNITY): Payer: Self-pay | Admitting: Internal Medicine

## 2019-08-22 ENCOUNTER — Other Ambulatory Visit: Payer: Self-pay

## 2019-08-22 ENCOUNTER — Observation Stay (HOSPITAL_COMMUNITY)
Admission: EM | Admit: 2019-08-22 | Discharge: 2019-08-23 | Disposition: A | Discharge status: Home / Self Care | Payer: Medicare HMO | Attending: Internal Medicine | Admitting: Internal Medicine

## 2019-08-22 DIAGNOSIS — N183 Chronic kidney disease, stage 3 unspecified: Secondary | ICD-10-CM | POA: Insufficient documentation

## 2019-08-22 DIAGNOSIS — E785 Hyperlipidemia, unspecified: Secondary | ICD-10-CM | POA: Diagnosis present

## 2019-08-22 DIAGNOSIS — E875 Hyperkalemia: Secondary | ICD-10-CM | POA: Diagnosis present

## 2019-08-22 DIAGNOSIS — I5033 Acute on chronic diastolic (congestive) heart failure: Secondary | ICD-10-CM | POA: Diagnosis not present

## 2019-08-22 DIAGNOSIS — E039 Hypothyroidism, unspecified: Secondary | ICD-10-CM | POA: Diagnosis not present

## 2019-08-22 DIAGNOSIS — J811 Chronic pulmonary edema: Secondary | ICD-10-CM | POA: Diagnosis not present

## 2019-08-22 DIAGNOSIS — Z20822 Contact with and (suspected) exposure to covid-19: Secondary | ICD-10-CM | POA: Diagnosis not present

## 2019-08-22 DIAGNOSIS — D649 Anemia, unspecified: Secondary | ICD-10-CM | POA: Diagnosis present

## 2019-08-22 DIAGNOSIS — I130 Hypertensive heart and chronic kidney disease with heart failure and stage 1 through stage 4 chronic kidney disease, or unspecified chronic kidney disease: Secondary | ICD-10-CM | POA: Diagnosis not present

## 2019-08-22 DIAGNOSIS — R0789 Other chest pain: Secondary | ICD-10-CM | POA: Diagnosis not present

## 2019-08-22 DIAGNOSIS — Z789 Other specified health status: Secondary | ICD-10-CM

## 2019-08-22 DIAGNOSIS — R079 Chest pain, unspecified: Secondary | ICD-10-CM

## 2019-08-22 DIAGNOSIS — I5022 Chronic systolic (congestive) heart failure: Secondary | ICD-10-CM | POA: Diagnosis not present

## 2019-08-22 DIAGNOSIS — R0602 Shortness of breath: Secondary | ICD-10-CM | POA: Diagnosis not present

## 2019-08-22 DIAGNOSIS — I4821 Permanent atrial fibrillation: Secondary | ICD-10-CM | POA: Diagnosis present

## 2019-08-22 DIAGNOSIS — E871 Hypo-osmolality and hyponatremia: Secondary | ICD-10-CM | POA: Diagnosis present

## 2019-08-22 DIAGNOSIS — J189 Pneumonia, unspecified organism: Secondary | ICD-10-CM | POA: Diagnosis not present

## 2019-08-22 DIAGNOSIS — Z79899 Other long term (current) drug therapy: Secondary | ICD-10-CM | POA: Insufficient documentation

## 2019-08-22 DIAGNOSIS — I517 Cardiomegaly: Secondary | ICD-10-CM | POA: Diagnosis not present

## 2019-08-22 DIAGNOSIS — R000 Tachycardia, unspecified: Secondary | ICD-10-CM | POA: Diagnosis not present

## 2019-08-22 LAB — COMPREHENSIVE METABOLIC PANEL
ALT: 20 U/L (ref 0–44)
AST: 31 U/L (ref 15–41)
Albumin: 3.8 g/dL (ref 3.5–5.0)
Alkaline Phosphatase: 75 U/L (ref 38–126)
Anion gap: 10 (ref 5–15)
BUN: 37 mg/dL — ABNORMAL HIGH (ref 8–23)
CO2: 20 mmol/L — ABNORMAL LOW (ref 22–32)
Calcium: 9.3 mg/dL (ref 8.9–10.3)
Chloride: 97 mmol/L — ABNORMAL LOW (ref 98–111)
Creatinine, Ser: 1.39 mg/dL — ABNORMAL HIGH (ref 0.44–1.00)
GFR calc Af Amer: 40 mL/min — ABNORMAL LOW (ref >60)
GFR calc non Af Amer: 34 mL/min — ABNORMAL LOW (ref >60)
Glucose, Bld: 138 mg/dL — ABNORMAL HIGH (ref 70–99)
Potassium: 5.2 mmol/L — ABNORMAL HIGH (ref 3.5–5.1)
Sodium: 127 mmol/L — ABNORMAL LOW (ref 135–145)
Total Bilirubin: 3 mg/dL — ABNORMAL HIGH (ref 0.3–1.2)
Total Protein: 7.5 g/dL (ref 6.5–8.1)

## 2019-08-22 LAB — CBC WITH DIFFERENTIAL/PLATELET
Abs Immature Granulocytes: 0.01 10*3/uL (ref 0.00–0.07)
Basophils Absolute: 0 10*3/uL (ref 0.0–0.1)
Basophils Relative: 1 %
Eosinophils Absolute: 0 10*3/uL (ref 0.0–0.5)
Eosinophils Relative: 0 %
HCT: 28.3 % — ABNORMAL LOW (ref 36.0–46.0)
Hemoglobin: 9.2 g/dL — ABNORMAL LOW (ref 12.0–15.0)
Immature Granulocytes: 0 %
Lymphocytes Relative: 15 %
Lymphs Abs: 0.9 10*3/uL (ref 0.7–4.0)
MCH: 30.8 pg (ref 26.0–34.0)
MCHC: 32.5 g/dL (ref 30.0–36.0)
MCV: 94.6 fL (ref 80.0–100.0)
Monocytes Absolute: 0.6 10*3/uL (ref 0.1–1.0)
Monocytes Relative: 11 %
Neutro Abs: 4.1 10*3/uL (ref 1.7–7.7)
Neutrophils Relative %: 73 %
Platelets: 199 10*3/uL (ref 150–400)
RBC: 2.99 MIL/uL — ABNORMAL LOW (ref 3.87–5.11)
RDW: 22.8 % — ABNORMAL HIGH (ref 11.5–15.5)
WBC: 5.6 10*3/uL (ref 4.0–10.5)
nRBC: 1.2 % — ABNORMAL HIGH (ref 0.0–0.2)

## 2019-08-22 LAB — TROPONIN I (HIGH SENSITIVITY)
Troponin I (High Sensitivity): 20 ng/L — ABNORMAL HIGH (ref <18)
Troponin I (High Sensitivity): 20 ng/L — ABNORMAL HIGH (ref <18)

## 2019-08-22 LAB — BRAIN NATRIURETIC PEPTIDE: B Natriuretic Peptide: 2155.6 pg/mL — ABNORMAL HIGH (ref 0.0–100.0)

## 2019-08-22 LAB — SARS CORONAVIRUS 2 BY RT PCR (HOSPITAL ORDER, PERFORMED IN ~~LOC~~ HOSPITAL LAB): SARS Coronavirus 2: NEGATIVE

## 2019-08-22 MED ORDER — FUROSEMIDE 10 MG/ML IJ SOLN
20.0000 mg | Freq: Two times a day (BID) | INTRAMUSCULAR | Status: DC
  Administered 2019-08-22 – 2019-08-23 (×2): 20 mg via INTRAVENOUS
  Filled 2019-08-22 (×2): qty 2

## 2019-08-22 MED ORDER — LEVOTHYROXINE SODIUM 88 MCG PO TABS
88.0000 ug | ORAL_TABLET | Freq: Every day | ORAL | Status: DC
  Administered 2019-08-23: 88 ug via ORAL
  Filled 2019-08-22: qty 1

## 2019-08-22 MED ORDER — FUROSEMIDE 10 MG/ML IJ SOLN
40.0000 mg | Freq: Once | INTRAMUSCULAR | Status: AC
  Administered 2019-08-22: 40 mg via INTRAVENOUS
  Filled 2019-08-22: qty 4

## 2019-08-22 MED ORDER — LEVOTHYROXINE SODIUM 88 MCG PO TABS: 88.0000 ug | ORAL_TABLET | ORAL | Status: DC

## 2019-08-22 MED ORDER — METOPROLOL TARTRATE 25 MG PO TABS
12.5000 mg | ORAL_TABLET | Freq: Two times a day (BID) | ORAL | Status: DC
  Administered 2019-08-23: 12.5 mg via ORAL
  Filled 2019-08-22 (×2): qty 1

## 2019-08-22 MED ORDER — MIRTAZAPINE 15 MG PO TABS
7.5000 mg | ORAL_TABLET | Freq: Every day | ORAL | Status: DC
  Administered 2019-08-22: 7.5 mg via ORAL
  Filled 2019-08-22: qty 1

## 2019-08-22 MED ORDER — ASPIRIN EC 81 MG PO TBEC
81.0000 mg | DELAYED_RELEASE_TABLET | Freq: Every day | ORAL | Status: DC
  Administered 2019-08-22 – 2019-08-23 (×2): 81 mg via ORAL
  Filled 2019-08-22 (×2): qty 1

## 2019-08-22 MED ORDER — APIXABAN 2.5 MG PO TABS
2.5000 mg | ORAL_TABLET | Freq: Two times a day (BID) | ORAL | Status: DC
  Administered 2019-08-22 – 2019-08-23 (×3): 2.5 mg via ORAL
  Filled 2019-08-22 (×4): qty 1

## 2019-08-22 NOTE — ED Notes (Signed)
Breakfast tray given

## 2019-08-22 NOTE — Assessment & Plan Note (Addendum)
-  Follows outpatient with cardiology -continue Eliquis -pacemaker in place -continue metoprolol -upcoming outpatient appointment on 08/24/2019 with Dr. Rayann Heman

## 2019-08-22 NOTE — Assessment & Plan Note (Signed)
-  Continue statin

## 2019-08-22 NOTE — Progress Notes (Signed)
PROGRESS NOTE    Nancy Meadows   ZOX:[id number]  DOB: 04/28/1932  DOA: 08/22/2019     0  PCP: Merrilee Seashore, MD  CC: Shortness of breath and lower extremity edema  Hospital Course: Nancy Meadows is an 86 year old African-American female with PMH sick sinus syndrome s/p PPM, chronic diastolic CHF, permanent A. fib, normocytic anemia, cholelithiasis, CKD 3, goiter/hypothyroidism, hyperlipidemia, bladder lesion with hematuria and frequency, history of subdural hematoma who was brought to the ER with worsening shortness of breath.  She also had symptoms of orthopnea, PND, and worsening lower extremity edema. She also endorsed that for some reason she had not been taking her Lasix since Wednesday prior to admission.  She could not elaborate as to why she was holding it.  She also had reported to the admitting staff that she was taking in a moderate amount of fluids and salt intake. BNP was elevated 2155 on admission.  CXR revealed vascular congestion, and she had significant lower extremity pitting edema.  She was started on IV Lasix with good response and admitted for further IV diuresis.   Interval History:  Admitted overnight after worsening edema and shortness of breath at home.  She is feeling a little better this morning after diuresis with IV Lasix.  Denies any chest pain, palpitations.  Old records reviewed in assessment of this patient  ROS: Constitutional: negative for chills and fevers, Respiratory: positive for Shortness of breath, Cardiovascular: negative for chest pain and Gastrointestinal: negative for abdominal pain  Assessment & Plan: Hypothyroidism -Continue Synthroid  HLD (hyperlipidemia) -Continue statin  Permanent atrial fibrillation ([place]) -Follows outpatient with cardiology -continue Eliquis -pacemaker in place -continue metoprolol -upcoming outpatient appointment on 08/24/2019 with Dr. Rayann Heman  Acute on chronic diastolic congestive heart failure  ([place]) -Multifactorial in setting of missing doses of Lasix at home the past few days as well as possible salt intake -responding well to IV Lasix, will continue -continue daily weights  Hyponatremia -For now considering hypervolemic hyponatremia -monitor BMP while on diuresis  Hyperkalemia -Mild, no indication for treatment.  Patient denies palpitations -currently on diuresis with Lasix, follow-up BMP in a.m.  Equivocal immunity to COVID-19 virus -Patient states she received first dose of Pfizer vaccine in February 2021.  Had been waiting due to being sick intermittently throughout the year for second dose -patient has been encouraged by cardiology to get second vaccine; also encouraged her to consider doing this as well.  At this point may possibly even need to repeat series altogether given uncertainty of immunity after 6 months at this time   Antimicrobials: None  DVT prophylaxis: Eliquis Code Status: Full Family Communication: None present Disposition Plan:  Status is: Observation  The patient remains OBS appropriate and will d/c before 2 midnights.  Dispo: The patient is from: Home              Anticipated d/c is to: Home              Anticipated d/c date is: 1 day              Patient currently is not medically stable to d/c.       Objective: Blood pressure 100/85, pulse 92, temperature (!) 97.4 F (36.3 C), temperature source Oral, resp. rate (!) 25, height _0  (1.702 m), weight 64.3 kg, SpO2 97 %.  Examination: General appearance: Pleasant elderly woman resting in bed in no distress Head: Normocephalic, without obvious abnormality, atraumatic Eyes: EOMI Lungs: clear to auscultation bilaterally  Heart: irregularly irregular rhythm and S1, S2 normal Abdomen: normal findings: bowel sounds normal and soft, non-tender Extremities: No edema Skin: mobility and turgor normal Neurologic: Grossly normal  Consultants:   None  Procedures:   None  Data  Reviewed: I have personally reviewed following labs and imaging studies Results for orders placed or performed during the hospital encounter of 08/22/19 (from the past 24 hour(s))  CBC with Differential/Platelet     Status: Abnormal   Collection Time: 08/22/19  2:12 AM  Result Value Ref Range   WBC 5.6 4.0 - 10.5 K/uL   RBC 2.99 (L) 3.87 - 5.11 MIL/uL   Hemoglobin 9.2 (L) 12.0 - 15.0 g/dL   HCT 28.3 (L) 36 - 46 %   MCV 94.6 80.0 - 100.0 fL   MCH 30.8 26.0 - 34.0 pg   MCHC 32.5 30.0 - 36.0 g/dL   RDW 22.8 (H) 11.5 - 15.5 %   Platelets 199 150 - 400 K/uL   nRBC 1.2 (H) 0.0 - 0.2 %   Neutrophils Relative % 73 %   Neutro Abs 4.1 1.7 - 7.7 K/uL   Lymphocytes Relative 15 %   Lymphs Abs 0.9 0.7 - 4.0 K/uL   Monocytes Relative 11 %   Monocytes Absolute 0.6 0 - 1 K/uL   Eosinophils Relative 0 %   Eosinophils Absolute 0.0 0 - 0 K/uL   Basophils Relative 1 %   Basophils Absolute 0.0 0 - 0 K/uL   Immature Granulocytes 0 %   Abs Immature Granulocytes 0.01 0.00 - 0.07 K/uL   Polychromasia PRESENT    Target Cells PRESENT   Comprehensive metabolic panel     Status: Abnormal   Collection Time: 08/22/19  2:12 AM  Result Value Ref Range   Sodium 127 (L) 135 - 145 mmol/L   Potassium 5.2 (H) 3.5 - 5.1 mmol/L   Chloride 97 (L) 98 - 111 mmol/L   CO2 20 (L) 22 - 32 mmol/L   Glucose, Bld 138 (H) 70 - 99 mg/dL   BUN 37 (H) 8 - 23 mg/dL   Creatinine, Ser 1.39 (H) 0.44 - 1.00 mg/dL   Calcium 9.3 8.9 - 10.3 mg/dL   Total Protein 7.5 6.5 - 8.1 g/dL   Albumin 3.8 3.5 - 5.0 g/dL   AST 31 15 - 41 U/L   ALT 20 0 - 44 U/L   Alkaline Phosphatase 75 38 - 126 U/L   Total Bilirubin 3.0 (H) 0.3 - 1.2 mg/dL   GFR calc non Af Amer 34 (L) >60 mL/min   GFR calc Af Amer 40 (L) >60 mL/min   Anion gap 10 5 - 15  Brain natriuretic peptide     Status: Abnormal   Collection Time: 08/22/19  2:12 AM  Result Value Ref Range   B Natriuretic Peptide 2,155.6 (H) 0.0 - 100.0 pg/mL  Troponin I (High Sensitivity)      Status: Abnormal   Collection Time: 08/22/19  2:12 AM  Result Value Ref Range   Troponin I (High Sensitivity) 20 (H) <18 ng/L  SARS Coronavirus 2 by RT PCR (hospital order, performed in [place] hospital lab) Nasopharyngeal Nasopharyngeal Swab     Status: None   Collection Time: 08/22/19  2:57 AM   Specimen: Nasopharyngeal Swab  Result Value Ref Range   SARS Coronavirus 2 NEGATIVE NEGATIVE  Troponin I (High Sensitivity)     Status: Abnormal   Collection Time: 08/22/19  4:12 AM  Result Value Ref Range  Troponin I (High Sensitivity) 20 (H) <18 ng/L    Recent Results (from the past 240 hour(s))  SARS Coronavirus 2 by RT PCR (hospital order, performed in [facility] hospital lab) Nasopharyngeal Nasopharyngeal Swab     Status: None   Collection Time: 08/22/19  2:57 AM   Specimen: Nasopharyngeal Swab  Result Value Ref Range Status   SARS Coronavirus 2 NEGATIVE NEGATIVE Final    Comment: (NOTE) SARS-CoV-2 target nucleic acids are NOT DETECTED.  The SARS-CoV-2 RNA is generally detectable in upper and lower respiratory specimens during the acute phase of infection. The lowest concentration of SARS-CoV-2 viral copies this assay can detect is 250 copies / mL. A negative result does not preclude SARS-CoV-2 infection and should not be used as the sole basis for treatment or other patient management decisions.  A negative result may occur with improper specimen collection / handling, submission of specimen other than nasopharyngeal swab, presence of viral mutation(s) within the areas targeted by this assay, and inadequate number of viral copies (<250 copies / mL). A negative result must be combined with clinical observations, patient history, and epidemiological information.  Fact Sheet for Patients:   StrictlyIdeas.no  Fact Sheet for Healthcare Providers: BankingDealers.co.za  This test is not yet approved or  cleared by the [place]  FDA and has been authorized for detection and/or diagnosis of SARS-CoV-2 by FDA under an Emergency Use Authorization (EUA).  This EUA will remain in effect (meaning this test can be used) for the duration of the COVID-19 declaration under Section 564(b)(1) of the Act, 21 U.S.C. section 360bbb-3(b)(1), unless the authorization is terminated or revoked sooner.  Performed at [facility], [place] [address] [place], [place] [address]      Radiology Studies: DG Chest 2 View  Result Date: 08/22/2019 CLINICAL DATA:  Shortness of breath EXAM: CHEST - 2 VIEW COMPARISON:  July 29, 2019 FINDINGS: The heart size and mediastinal contours are unchanged with mild cardiomegaly. Aortic knob calcifications are seen. Left-sided pacemaker is present. Again noted is airspace consolidation within the right upper lobe, which appears unchanged from the prior exam of July 29, 2018, however improved since Jun 08, 2019. No new airspace consolidation is noted. There is prominence of the central pulmonary vasculature. No pleural effusion. No acute osseous abnormality. IMPRESSION: 1. Mild cardiomegaly and pulmonary vascular congestion. 2. Unchanged airspace consolidation within the right upper lobe, however improved since Jun 08, 2019. This could be due to improving pneumonia Followup PA and lateral chest X-ray is recommended in 3-4 weeks following trial of antibiotic therapy to ensure resolution and exclude underlying malignancy. Electronically Signed   By: Prudencio Pair M.D.   On: 08/22/2019 02:39   DG Chest 2 View  Final Result       Scheduled Meds: . apixaban  2.5 mg Oral BID  . aspirin EC  81 mg Oral Daily  . furosemide  20 mg Intravenous BID  . levothyroxine  88 mcg Oral Q0600  . metoprolol tartrate  12.5 mg Oral BID  . mirtazapine  7.5 mg Oral QHS   PRN Meds:  Continuous Infusions:    LOS: 0 days  Time spent: Greater than 50% of the 35 minute visit was spent in counseling/coordination of  care for the patient as laid out in the A&P.   Dwyane Dee, MD [facility] 08/22/2019, 11:32 AM   Contact via secure chat.  To contact the attending provider between 7A-7P or the covering provider during after hours 7P-7A,  please log into the web site [email or web address] and access using universal [place] password for that web site. If you do not have the password, please call the hospital operator.

## 2019-08-22 NOTE — Assessment & Plan Note (Addendum)
-  For now considering hypervolemic hyponatremia -monitor BMP while on diuresis - slight downtrend on lasix; informed patient's son to have repeat BMP in next 1-2 days, he voiced understanding

## 2019-08-22 NOTE — ED Notes (Signed)
Attempted to ambulate patient. Pt oxygen saturation decreased to 87% with 4 steps. Pt breathing labored.

## 2019-08-22 NOTE — H&P (Signed)
History and Physical    Nancy Meadows UXN:[id number] DOB: 1932/04/15 DOA: 08/22/2019  PCP: Merrilee Seashore, MD   Patient coming from: Home.   I have personally briefly reviewed patient's old medical records in [place]  Chief Complaint: Shortness of breath.  HPI: Nancy Meadows is a 86 y.o. female with medical history significant of head trauma, respiratory failure, normocytic anemia, history of cholelithiasis, stage III CKD, dyspnea, eczema, goiter/hypothyroidism, hyperlipidemia, bladder lesion with hematuria and frequency, paroxysmal atrial fibrillation, sick sinus syndrome, syncope, history of subdural hematoma who is brought to the emergency department due to progressively worse shortness of breath associated with orthopnea, PND and bilateral lower extremity pitting edema since earlier in the week.  She was treated with furosemide with good initial results, but states that yesterday she felt very weak and dyspneic.  She mentions that she has been eating cheesy crackers and salted crackers with peanut butter.  She has also been drinking fluids, because she was told by a healthcare provider that she may have been dehydrated.  She denies fever, rhinorrhea, sore throat, hemoptysis, wheezing, chest pain, dizziness, diaphoresis, abdominal pain, nausea, emesis, constipation, recent loose stools, melena or hematochezia.  No dysuria or hematuria, but complains of occasional frequency.    ED Course: Initial vital signs were temperature 97.7 F, pulse 63, respiration 15, blood pressure 106/87 mmHg O2 sat 93% on room air.  She received supplemental oxygen and furosemide 40 mg IVP x1.  Her CBC showed a white count of 5.6, hemoglobin 9.2 g/dL platelets 199.  Sodium 127, potassium 5.3 chloride 97 CO2 20 mmol/L.  LFTs were normal.  Glucose 138, BUN 37 creatinine 1.39 mg/dL.  BNP was 2000 155.6 pg/mL.  Troponin was 20 ng/L twice.  SARS coronavirus 2 PCR negative.  Her chest radiograph shows  cardiomegaly with vascular congestion and improving previous airspace disease.  Please see images and radiology report for further detail.  Review of Systems: As per HPI otherwise all other systems reviewed and are negative.  Past Medical History:  Diagnosis Date  . Acute head trauma 02/09/2012  . Acute respiratory failure ([place]) 10/22/2017  . Anemia 10/22/2017  . Arthritis    ddd-- occ. pain  . Cholelithiasis   . CKD (chronic kidney disease) stage 3, GFR 30-59 ml/min 03/27/2018  . DIZZINESS 03/07/2010   Qualifier: Diagnosis of  By: Burnett Kanaris    . Dyspnea 03/27/2018  . Eczema   . Goiter   . HLD (hyperlipidemia)   . Hx of cardiovascular stress test    a. ETT-MV 7/14:  Low risk, ant defect c/w soft tissue atten., no ischemia, EF 77%  . HYPERLIPIDEMIA 03/08/2010   Qualifier: Diagnosis of  By: Stanford Breed, MD, Kandyce Rud   . Hypothyroidism 03/08/2010   Qualifier: Diagnosis of  By: Stanford Breed, MD, Kandyce Rud   . Hypoxia   . Lesion of bladder    hematuria/ frequency  . Palpitations 08/17/2012  . Paroxysmal atrial fibrillation ([place]) 01/2016   cahds2vasc sore is 3.  consider anticoagulation if afib burden increases  . Pleural effusion, bilateral 10/22/2017  . SAH (subarachnoid hemorrhage) ([place]) 02/09/2012  . SDH (subdural hematoma) ([facility])    in the setting of syncope and fall prior to PPM implant, now resolved  . Sick sinus syndrome ([place])   . Subdural hematoma, acute ([place]) 02/09/2012  . Syncope    pauses of >4 seconds    Past Surgical History:  Procedure Laterality Date  . ABDOMINAL HYSTERECTOMY  age 32  .  APPENDECTOMY  age 76  . bilateral salpingoophectomy  age 9  . CATARACT EXTRACTION W/ INTRAOCULAR LENS  IMPLANT, BILATERAL  2012  . CYSTOSCOPY WITH BIOPSY  11/21/2010   Procedure: CYSTOSCOPY WITH BIOPSY;  Surgeon: Molli Hazard, MD;  Location: [facility];  Service: Urology;  Laterality: N/A;  CYSTOSCOPY WITH BLADDER BIOPSY  AND INSTILLATION OF  INDIGO CARMINE  . CYSTOSCOPY/RETROGRADE/URETEROSCOPY  11/21/2010   Procedure: CYSTOSCOPY/RETROGRADE/URETEROSCOPY;  Surgeon: Molli Hazard, MD;  Location: [facility];  Service: Urology;  Laterality: Left;  . gallstones removed  age 27  . LOOP RECORDER IMPLANT N/A 12/31/2012   Procedure: LOOP RECORDER IMPLANT;  Surgeon: Coralyn Mark, MD;  Location: [place] C[facility];  Service: Cardiovascular;  Laterality: N/A;  . PACEMAKER INSERTION  05-20-2013   MDT ADDRL1 pacemaker implanted by Dr Rayann Heman for SSS and symptomatic bradycardia  . PERMANENT PACEMAKER INSERTION Left 05/20/2013   Procedure: PERMANENT PACEMAKER INSERTION;  Surgeon: Coralyn Mark, MD;  Location: [place] C[facility];  Service: Cardiovascular;  Laterality: Left;    Social History  reports that she has never smoked. She has never used smokeless tobacco. She reports that she does not drink alcohol and does not use drugs.  Allergies  Allergen Reactions  . Acetaminophen Other (See Comments)    Syncope  . Meclizine Other (See Comments)    Increased dizziness and confusion    Family History  Problem Relation Age of Onset  . Congestive Heart Failure Father   . Heart disease Father   . Stroke Mother    Prior to Admission medications   Medication Sig Start Date End Date Taking? Authorizing Provider  apixaban (ELIQUIS) 2.5 MG TABS tablet Take 1 tablet (2.5 mg total) by mouth 2 (two) times daily. 07/20/19  Yes Allred, Jeneen Rinks, MD  aspirin EC 81 MG tablet Take 81 mg by mouth daily. Swallow whole.   Yes [provider]  levothyroxine (SYNTHROID, LEVOTHROID) 88 MCG tablet Take 88 mcg by mouth every morning.    Yes [provider]  metoprolol tartrate (LOPRESSOR) 25 MG tablet Take 12.5 mg by mouth 2 (two) times daily. 08/18/19  Yes [provider]  mirtazapine (REMERON) 7.5 MG tablet Take 7.5 mg by mouth at bedtime.   Yes [provider]    Physical Exam: Vitals:   08/22/19 0219 08/22/19 0245  08/22/19 0249 08/22/19 0500  BP: 106/87 104/72  102/80  Pulse: 63 77 89 87  Resp: 15 (!) 21 14 (!) 28  Temp: 97.7 F (36.5 C)     TempSrc: Oral     SpO2: 93% 94% 96% 97%    Constitutional: Frail, elderly female. Eyes: PERRL, lids and conjunctivae normal ENMT: Nasal cannula in place.  Mucous membranes are moist.  Lips are dry.  Posterior pharynx clear of any exudate or lesions. Neck: normal, supple, no masses, no thyromegaly Respiratory: Bibasilar crackles. Normal respiratory effort. No accessory muscle use.  Cardiovascular: Irregularly irregular rhythm in the 59D, 3/6 systolic murmur no murmurs / rubs / gallops.  1+ lower extremity pitting edema. 2+ pedal pulses. No carotid bruits.  Abdomen: Nondistended. Bowel sounds positive.  Soft, no tenderness, no masses palpated. No hepatosplenomegaly.  Musculoskeletal: no clubbing / cyanosis.  Good ROM, no contractures. Normal muscle tone.  Skin: no rashes, lesions, ulcers on limited dermatological examination. Neurologic: CN 2-12 grossly intact. Sensation intact, DTR normal. Strength 5/5 in all 4.  Psychiatric: Normal judgment and insight. Alert and oriented x 3. Normal mood.  Labs on Admission: I have personally reviewed following labs and imaging studies  CBC: Recent Labs  Lab 08/22/19 0212  WBC 5.6  NEUTROABS 4.1  HGB 9.2*  HCT 28.3*  MCV 94.6  PLT 638    Basic Metabolic Panel: Recent Labs  Lab 08/22/19 0212  NA 127*  K 5.2*  CL 97*  CO2 20*  GLUCOSE 138*  BUN 37*  CREATININE 1.39*  CALCIUM 9.3    GFR: CrCl cannot be calculated (Unknown ideal weight.).  Liver Function Tests: Recent Labs  Lab 08/22/19 0212  AST 31  ALT 20  ALKPHOS 75  BILITOT 3.0*  PROT 7.5  ALBUMIN 3.8   Radiological Exams on Admission: DG Chest 2 View  Result Date: 08/22/2019 CLINICAL DATA:  Shortness of breath EXAM: CHEST - 2 VIEW COMPARISON:  July 29, 2019 FINDINGS: The heart size and mediastinal contours are unchanged with mild  cardiomegaly. Aortic knob calcifications are seen. Left-sided pacemaker is present. Again noted is airspace consolidation within the right upper lobe, which appears unchanged from the prior exam of July 29, 2018, however improved since Jun 08, 2019. No new airspace consolidation is noted. There is prominence of the central pulmonary vasculature. No pleural effusion. No acute osseous abnormality. IMPRESSION: 1. Mild cardiomegaly and pulmonary vascular congestion. 2. Unchanged airspace consolidation within the right upper lobe, however improved since Jun 08, 2019. This could be due to improving pneumonia Followup PA and lateral chest X-ray is recommended in 3-4 weeks following trial of antibiotic therapy to ensure resolution and exclude underlying malignancy. Electronically Signed   By: Prudencio Pair M.D.   On: 08/22/2019 02:39   Echo 08/19/2019 at [place]  A two-dimensional transthoracic echocardiogram with color flow and  Doppler was performed. Image Quality: Technically adequate.  -  SUMMARY  The left ventricular size is normal.  Left ventricular systolic function is low normal.  LV ejection fraction = 50-55%.  Unable to fully assess LV regional wall motion  The right ventricle is mildly dilated.  The right ventricular systolic function is mildly reduced.  Device lead in the right ventricle  The left atrium is moderately to severely dilated.  The right atrium is mildly to moderately dilated.  Status post mitraClip procedure.  The mean gradient across the mitral valve is 4.4 mmHg.  The heart rate for the mean mitral valve gradient is 110 BPM.  There is moderate mitral regurgitation.  The mitral regurgitant jet is eccentrically directed.  There is moderate tricuspid regurgitation.  Estimated right ventricular systolic pressure is 50 mmHg.  Estimated right atrial pressure is 15 mmHg..  Mild to moderate pulmonary hypertension.  There is no pericardial effusion.  Compared with  prior TTE PASP is better   EKG: Independently reviewed.  Vent. rate 88 BPM PR interval * ms QRS duration 99 ms QT/QTc 386/467 ms P-R-T axes * -73 59 Atrial fibrillation Left anterior fascicular block Abnormal R-wave progression, early transition  Assessment/Plan Principal Problem:   Acute on chronic diastolic congestive heart failure (HCC) Observation/telemetry Continue supplemental oxygen. Fluid restriction. Sodium restriction. Monitor daily weights, intake and output. Furosemide 20 mg IVP twice daily. Monitor renal function electrolytes. Scheduled to follow-up with cardiology this week. Consider cardiology evaluation while in the hospital.  Active Problems:   Permanent atrial fibrillation (HCC) CHA?DS?-VASc Score of at least 5. Continue Eliquis 2.5 p.o. twice daily Holding a.m. metoprolol due to acute CHF observation.    Hyponatremia Hypervolemic in nature. Continue fluid restriction and diuretic. Monitor sodium  level.    Hyperkalemia Received furosemide earlier. Follow-up potassium level.     Hypothyroidism Continue levothyroxine 88 mcg p.o. daily.    HLD (hyperlipidemia) Not on medical therapy.    Normocytic anemia Monitor H&H.   DVT prophylaxis: On apixaban. Code Status:   Full code. Family Communication:   Disposition Plan:   Patient is from:  Home.  Anticipated DC to:  Home.  Anticipated DC date:  08/23/2019.  Anticipated DC barriers: Clinical status. Consults called: Admission status:  Observation/telemetry.   Severity of Illness: High given symptomatology, age and comorbidities.  Reubin Milan MD [facility]  How to contact the [facility] Attending or Consulting provider [place] or covering provider during after hours [place], for this patient?   1. Check the care team in [facility] and look for a) attending/consulting [facility] provider listed and b) the [facility] team listed 2. Log into [email or web address] and use [place]'s universal password to access.  If you do not have the password, please contact the hospital operator. 3. Locate the [facility] provider you are looking for under [facility] and page to a number that you can be directly reached. 4. If you still have difficulty reaching the provider, please page the [facility] (Director on Call) for the Hospitalists listed on amion for assistance.  08/22/2019, 5:17 AM   This document was prepared using Dragon voice recognition software and may contain some unintended transcription errors.

## 2019-08-22 NOTE — Assessment & Plan Note (Signed)
-  Patient states she received first dose of Pfizer vaccine in February 2021.  Had been waiting due to being sick intermittently throughout the year for second dose -patient has been encouraged by cardiology to get second vaccine; also encouraged her to consider doing this as well.  At this point may possibly even need to repeat series altogether given uncertainty of immunity after 6 months at this time

## 2019-08-22 NOTE — Plan of Care (Signed)

## 2019-08-22 NOTE — ED Notes (Signed)
Patient transported to X-ray 

## 2019-08-22 NOTE — ED Provider Notes (Addendum)
[facility] DEPT Provider Note  CSN: [id number] Arrival date & time: 08/22/19 0155  Chief Complaint(s) Shortness of Breath  HPI Nancy Meadows is a 86 y.o. female with a past medical history listed below pertinent for history recent mitral valve replacement currently being treated for heart failure due to increased dyspnea on exertion and peripheral edema.  Patient was placed on Lasix several days ago.  Has been taking the medication as prescribed.  Reports that initially the Lasix improved her symptoms but for the past day she has had worsening dyspnea on exertion even with minimal movement.  She denies any associated chest pain.  No recent fevers or infections.  No abdominal pain.  No nausea or vomiting.  She does have peripheral edema.  HPI  Past Medical History Past Medical History:  Diagnosis Date  . Acute head trauma 02/09/2012  . Acute respiratory failure ([place]) 10/22/2017  . Anemia 10/22/2017  . Arthritis    ddd-- occ. pain  . Cholelithiasis   . CKD (chronic kidney disease) stage 3, GFR 30-59 ml/min 03/27/2018  . DIZZINESS 03/07/2010   Qualifier: Diagnosis of  By: Burnett Kanaris    . Dyspnea 03/27/2018  . Eczema   . Goiter   . HLD (hyperlipidemia)   . Hx of cardiovascular stress test    a. ETT-MV 7/14:  Low risk, ant defect c/w soft tissue atten., no ischemia, EF 77%  . HYPERLIPIDEMIA 03/08/2010   Qualifier: Diagnosis of  By: Stanford Breed, MD, Kandyce Rud   . Hypothyroidism 03/08/2010   Qualifier: Diagnosis of  By: Stanford Breed, MD, Kandyce Rud   . Hypoxia   . Lesion of bladder    hematuria/ frequency  . Palpitations 08/17/2012  . Paroxysmal atrial fibrillation ([place]) 01/2016   cahds2vasc sore is 3.  consider anticoagulation if afib burden increases  . Pleural effusion, bilateral 10/22/2017  . SAH (subarachnoid hemorrhage) ([place]) 02/09/2012  . SDH (subdural hematoma) ([facility])    in the setting of syncope and fall prior to PPM implant, now  resolved  . Sick sinus syndrome ([place])   . Subdural hematoma, acute ([place]) 02/09/2012  . Syncope    pauses of >4 seconds   Patient Active Problem List   Diagnosis Date Noted  . Permanent atrial fibrillation ([place]) 08/04/2019  . Secondary hypercoagulable state ([place]) 08/04/2019  . Acute diastolic CHF (congestive heart failure) ([place]) 06/10/2019  . Acute on chronic diastolic CHF (congestive heart failure) ([place]) 06/10/2019  . Fall   . Severe mitral regurgitation   . Compression fracture of first lumbar vertebra ([place]) 06/08/2019  . Closed compression fracture of L3 lumbar vertebra, initial encounter ([place]) 06/08/2019  . Dyspnea 03/27/2018  . CKD (chronic kidney disease) stage 3, GFR 30-59 ml/min 03/27/2018  . AF (paroxysmal atrial fibrillation) ([place]) 03/27/2018  . Hypoxia   . Atrial fibrillation with RVR ([place])   . Acute respiratory failure ([place]) 10/22/2017  . Pleural effusion, bilateral 10/22/2017  . Anemia 10/22/2017  . Premature atrial contraction 09/05/2014  . Sick sinus syndrome ([place]) 03/07/2013  . Palpitations 08/17/2012  . Syncope 02/09/2012  . Acute head trauma 02/09/2012  . Subdural hematoma, acute ([place]) 02/09/2012  . SAH (subarachnoid hemorrhage) ([place]) 02/09/2012  . Hypothyroidism 03/08/2010  . HYPERLIPIDEMIA 03/08/2010  . CARDIAC MURMUR 03/08/2010  . CHEST PAIN 03/08/2010  . DIZZINESS 03/07/2010   Home Medication(s) Prior to Admission medications   Medication Sig Start Date End Date Taking? Authorizing Provider  apixaban (ELIQUIS) 2.5 MG TABS tablet Take 1 tablet (  2.5 mg total) by mouth 2 (two) times daily. 07/20/19  Yes Allred, Jeneen Rinks, MD  aspirin EC 81 MG tablet Take 81 mg by mouth daily. Swallow whole.   Yes [provider]  levothyroxine (SYNTHROID, LEVOTHROID) 88 MCG tablet Take 88 mcg by mouth every morning.    Yes [provider]  metoprolol tartrate (LOPRESSOR) 25 MG tablet Take 12.5 mg by mouth 2 (two) times daily. 08/18/19  Yes [provider]   mirtazapine (REMERON) 7.5 MG tablet Take 7.5 mg by mouth at bedtime.   Yes [provider]                                                                                                                                    Past Surgical History Past Surgical History:  Procedure Laterality Date  . ABDOMINAL HYSTERECTOMY  age 62  . APPENDECTOMY  age 84  . bilateral salpingoophectomy  age 86  . CATARACT EXTRACTION W/ INTRAOCULAR LENS  IMPLANT, BILATERAL  2012  . CYSTOSCOPY WITH BIOPSY  11/21/2010   Procedure: CYSTOSCOPY WITH BIOPSY;  Surgeon: Molli Hazard, MD;  Location: [facility];  Service: Urology;  Laterality: N/A;  CYSTOSCOPY WITH BLADDER BIOPSY  AND INSTILLATION OF INDIGO CARMINE  . CYSTOSCOPY/RETROGRADE/URETEROSCOPY  11/21/2010   Procedure: CYSTOSCOPY/RETROGRADE/URETEROSCOPY;  Surgeon: Molli Hazard, MD;  Location: [facility];  Service: Urology;  Laterality: Left;  . gallstones removed  age 67  . LOOP RECORDER IMPLANT N/A 12/31/2012   Procedure: LOOP RECORDER IMPLANT;  Surgeon: Coralyn Mark, MD;  Location: [facility] CATH [facility];  Service: Cardiovascular;  Laterality: N/A;  . PACEMAKER INSERTION  05-20-2013   MDT ADDRL1 pacemaker implanted by Dr Rayann Heman for SSS and symptomatic bradycardia  . PERMANENT PACEMAKER INSERTION Left 05/20/2013   Procedure: PERMANENT PACEMAKER INSERTION;  Surgeon: Coralyn Mark, MD;  Location: [facility] CATH [facility];  Service: Cardiovascular;  Laterality: Left;   Family History Family History  Problem Relation Age of Onset  . Congestive Heart Failure Father   . Heart disease Father   . Stroke Mother     Social History Social History   Tobacco Use  . Smoking status: Never Smoker  . Smokeless tobacco: Never Used  . Tobacco comment: tobacco use - no  Vaping Use  . Vaping Use: Never used  Substance Use Topics  . Alcohol use: No  . Drug use: No   Allergies Acetaminophen and Meclizine  Review of Systems Review of  Systems All other systems are reviewed and are negative for acute change except as noted in the HPI  Physical Exam Vital Signs  I have reviewed the triage vital signs BP 104/72   Pulse 89   Temp 97.7 F (36.5 C) (Oral)   Resp 14   SpO2 96%   Physical Exam Vitals reviewed.  Constitutional:      General: She is not in acute distress.  Appearance: She is well-developed. She is not diaphoretic.  HENT:     Head: Normocephalic and atraumatic.     Nose: Nose normal.  Eyes:     General: No scleral icterus.       Right eye: No discharge.        Left eye: No discharge.     Conjunctiva/sclera: Conjunctivae normal.     Pupils: Pupils are equal, round, and reactive to light.  Cardiovascular:     Rate and Rhythm: Normal rate and regular rhythm.     Heart sounds: Murmur heard.  Systolic murmur is present with a grade of 3/6.  No friction rub. No gallop.   Pulmonary:     Effort: Pulmonary effort is normal. Tachypnea present. No respiratory distress.     Breath sounds: Normal breath sounds. No stridor. No rales.  Abdominal:     General: There is no distension.     Palpations: Abdomen is soft.     Tenderness: There is no abdominal tenderness.  Musculoskeletal:        General: No tenderness.     Cervical back: Normal range of motion and neck supple.     Right lower leg: 1+ Pitting Edema present.     Left lower leg: 1+ Pitting Edema present.  Skin:    General: Skin is warm and dry.     Findings: No erythema or rash.  Neurological:     Mental Status: She is alert and oriented to person, place, and time.     ED Results and Treatments Labs (all labs ordered are listed, but only abnormal results are displayed) Labs Reviewed  CBC WITH DIFFERENTIAL/PLATELET - Abnormal; Notable for the following components:      Result Value   RBC 2.99 (*)    Hemoglobin 9.2 (*)    HCT 28.3 (*)    RDW 22.8 (*)    nRBC 1.2 (*)    All other components within normal limits  COMPREHENSIVE METABOLIC  PANEL - Abnormal; Notable for the following components:   Sodium 127 (*)    Potassium 5.2 (*)    Chloride 97 (*)    CO2 20 (*)    Glucose, Bld 138 (*)    BUN 37 (*)    Creatinine, Ser 1.39 (*)    Total Bilirubin 3.0 (*)    GFR calc non Af Amer 34 (*)    GFR calc Af Amer 40 (*)    All other components within normal limits  BRAIN NATRIURETIC PEPTIDE - Abnormal; Notable for the following components:   B Natriuretic Peptide 2,155.6 (*)    All other components within normal limits  TROPONIN I (HIGH SENSITIVITY) - Abnormal; Notable for the following components:   Troponin I (High Sensitivity) 20 (*)    All other components within normal limits  SARS CORONAVIRUS 2 BY RT PCR (HOSPITAL ORDER, [facility])  TROPONIN I (HIGH SENSITIVITY)  EKG  EKG Interpretation  Date/Time:  Sunday August 22 2019 02:21:01 EDT Ventricular Rate:  88 PR Interval:    QRS Duration: 99 QT Interval:  386 QTC Calculation: 467 R Axis:   -73 Text Interpretation: Atrial fibrillation Left anterior fascicular block Abnormal R-wave progression, early transition Confirmed by Addison Lank [phone]) on 08/22/2019 4:15:35 AM      Radiology DG Chest 2 View  Result Date: 08/22/2019 CLINICAL DATA:  Shortness of breath EXAM: CHEST - 2 VIEW COMPARISON:  July 29, 2019 FINDINGS: The heart size and mediastinal contours are unchanged with mild cardiomegaly. Aortic knob calcifications are seen. Left-sided pacemaker is present. Again noted is airspace consolidation within the right upper lobe, which appears unchanged from the prior exam of July 29, 2018, however improved since Jun 08, 2019. No new airspace consolidation is noted. There is prominence of the central pulmonary vasculature. No pleural effusion. No acute osseous abnormality. IMPRESSION: 1. Mild cardiomegaly and pulmonary vascular  congestion. 2. Unchanged airspace consolidation within the right upper lobe, however improved since Jun 08, 2019. This could be due to improving pneumonia Followup PA and lateral chest X-ray is recommended in 3-4 weeks following trial of antibiotic therapy to ensure resolution and exclude underlying malignancy. Electronically Signed   By: Prudencio Pair M.D.   On: 08/22/2019 02:39    Pertinent labs & imaging results that were available during my care of the patient were reviewed by me and considered in my medical decision making (see chart for details).  Medications Ordered in ED Medications  furosemide (LASIX) injection 40 mg (has no administration in time range)                                                                                                                                    Procedures Procedures  (including critical care time)  Medical Decision Making / ED Course I have reviewed the nursing notes for this encounter and the patient's prior records (if available in EHR or on provided paperwork).   Pa P Minix was evaluated in Emergency Department on 08/22/2019 for the symptoms described in the history of present illness. She was evaluated in the context of the global COVID-19 pandemic, which necessitated consideration that the patient might be at risk for infection with the SARS-CoV-2 virus that causes COVID-19. Institutional protocols and algorithms that pertain to the evaluation of patients at risk for COVID-19 are in a state of rapid change based on information released by regulatory bodies including the CDC and federal and state organizations. These policies and algorithms were followed during the patient's care in the ED.  Work-up consistent with CHF exacerbation. Patient had increased work of breathing with minimal movement and sats dropped to 80s. Patient was placed on supplemental oxygen and given IV Lasix.  We will discuss case with hospitalist team for admission and  further management.      Final Clinical Impression(s) / ED Diagnoses  Final diagnoses:  Acute on chronic diastolic congestive heart failure, NYHA class 2 ([place])      This chart was dictated using voice recognition software.  Despite best efforts to proofread,  errors can occur which can change the documentation meaning.     Fatima Blank, MD 08/22/19 [phone]

## 2019-08-22 NOTE — Assessment & Plan Note (Addendum)
-  Multifactorial in setting of missing doses of Lasix at home the past few days as well as possible salt intake -responding well to IV Lasix, will continue -continue daily weights -Ambulated well with physical therapy with no oxygen desaturation. Considered stable for d/c home; discussed with son also prior to d/c - resumed lasix 40 mg PO daily at discharge

## 2019-08-22 NOTE — Assessment & Plan Note (Addendum)
-  Mild, no indication for treatment.  Patient denies palpitations -currently on diuresis with Lasix, follow-up BMP in a.m. - resolved prior to dc

## 2019-08-22 NOTE — Progress Notes (Signed)
°   08/22/19 0937  Assess: MEWS Score  Temp 97.6 F (36.4 C)  BP 99/78  Pulse Rate 83  Resp (!) 24  SpO2 98 %  O2 Device Nasal Cannula  O2 Flow Rate (L/min) 2 L/min  Assess: MEWS Score  MEWS Temp 0  MEWS Systolic 1  MEWS Pulse 0  MEWS RR 1  MEWS LOC 0  MEWS Score 2  MEWS Score Color Yellow  Assess: if the MEWS score is Yellow or Red  Were vital signs taken at a resting state? Yes  Focused Assessment No change from prior assessment  Early Detection of Sepsis Score *See Row Information* Low  MEWS guidelines implemented *See Row Information* Yes  Treat  MEWS Interventions Escalated (See documentation below)  Pain Scale 0-10  Pain Score 0  Take Vital Signs  Increase Vital Sign Frequency  Yellow: Q 2hr X 2 then Q 4hr X 2, if remains yellow, continue Q 4hrs  Escalate  MEWS: Escalate Yellow: discuss with charge nurse/RN and consider discussing with provider and RRT  Notify: Charge Nurse/RN  Name of Charge Nurse/RN Notified Barron Schmid  Date Charge Nurse/RN Notified 08/22/19  Time Charge Nurse/RN Notified 0940

## 2019-08-22 NOTE — Hospital Course (Addendum)
Nancy Meadows is an 86 year old African-American female with PMH sick sinus syndrome s/p PPM, chronic diastolic CHF, permanent A. fib, normocytic anemia, cholelithiasis, CKD 3, goiter/hypothyroidism, hyperlipidemia, bladder lesion with hematuria and frequency, history of subdural hematoma who was brought to the ER with worsening shortness of breath.  She also had symptoms of orthopnea, PND, and worsening lower extremity edema. She also endorsed that for some reason she had not been taking her Lasix since Wednesday prior to admission.  She could not elaborate as to why she was holding it.  She also had reported to the admitting staff that she was taking in a moderate amount of fluids and salt intake. BNP was elevated 2155 on admission.  CXR revealed vascular congestion, and she had significant lower extremity pitting edema.  She was started on IV Lasix with good response and admitted for further IV diuresis. She ambulated with physical therapy on 08/23/2019 and had no significant oxygen desaturation.  She did remain very dyspneic and labored with breathing.  After discussion with her son, this has been her baseline at home and possibly related partly to her underlying A. fib.  She has an upcoming appointment with Dr. Rayann Heman on 08/24/2019 that she is also wishing to attend.  Her sodium did downtrend mildly during hospitalization.  Discussed this with her son on the day of discharge and recommended she have repeat BMP in the next 1 to 2 days.  He voiced understanding and says they have standing lab orders and can easily have labs checked on her outpatient.  He plans to do so either Tuesday or Wednesday.

## 2019-08-22 NOTE — ED Notes (Signed)
Called son at [phone] and let them know what room she went to.

## 2019-08-22 NOTE — Assessment & Plan Note (Signed)
-  Continue Synthroid °

## 2019-08-22 NOTE — ED Triage Notes (Signed)
Pt BIB EMS from home c/o shob with exertion. Pt tachypneic RR 32-34, 98% RA, BP 108/76, and HR 70-110. Recent negative COVID test. Afebrile. Bilateral edema in lower extremities. Hx of afib, CHF and pacemaker.

## 2019-08-23 ENCOUNTER — Encounter (HOSPITAL_COMMUNITY): Payer: Self-pay | Admitting: Internal Medicine

## 2019-08-23 DIAGNOSIS — I4821 Permanent atrial fibrillation: Secondary | ICD-10-CM | POA: Diagnosis not present

## 2019-08-23 DIAGNOSIS — I5033 Acute on chronic diastolic (congestive) heart failure: Secondary | ICD-10-CM | POA: Diagnosis not present

## 2019-08-23 LAB — BASIC METABOLIC PANEL
Anion gap: 8 (ref 5–15)
BUN: 38 mg/dL — ABNORMAL HIGH (ref 8–23)
CO2: 22 mmol/L (ref 22–32)
Calcium: 8.7 mg/dL — ABNORMAL LOW (ref 8.9–10.3)
Chloride: 94 mmol/L — ABNORMAL LOW (ref 98–111)
Creatinine, Ser: 1.32 mg/dL — ABNORMAL HIGH (ref 0.44–1.00)
GFR calc Af Amer: 43 mL/min — ABNORMAL LOW (ref >60)
GFR calc non Af Amer: 37 mL/min — ABNORMAL LOW (ref >60)
Glucose, Bld: 118 mg/dL — ABNORMAL HIGH (ref 70–99)
Potassium: 4.5 mmol/L (ref 3.5–5.1)
Sodium: 124 mmol/L — ABNORMAL LOW (ref 135–145)

## 2019-08-23 LAB — CBC
HCT: 26.3 % — ABNORMAL LOW (ref 36.0–46.0)
Hemoglobin: 8.4 g/dL — ABNORMAL LOW (ref 12.0–15.0)
MCH: 30.4 pg (ref 26.0–34.0)
MCHC: 31.9 g/dL (ref 30.0–36.0)
MCV: 95.3 fL (ref 80.0–100.0)
Platelets: 193 10*3/uL (ref 150–400)
RBC: 2.76 MIL/uL — ABNORMAL LOW (ref 3.87–5.11)
RDW: 22.5 % — ABNORMAL HIGH (ref 11.5–15.5)
WBC: 6.8 10*3/uL (ref 4.0–10.5)
nRBC: 0.6 % — ABNORMAL HIGH (ref 0.0–0.2)

## 2019-08-23 LAB — MAGNESIUM: Magnesium: 2.4 mg/dL (ref 1.7–2.4)

## 2019-08-23 MED ORDER — FUROSEMIDE 40 MG PO TABS
40.0000 mg | ORAL_TABLET | Freq: Every day | ORAL | 3 refills | Status: DC
Start: 2019-08-23 — End: 2019-08-24

## 2019-08-23 MED ORDER — GUAIFENESIN-DM 100-10 MG/5ML PO SYRP
5.0000 mL | ORAL_SOLUTION | ORAL | Status: DC | PRN
  Administered 2019-08-23: 5 mL via ORAL
  Filled 2019-08-23: qty 10

## 2019-08-23 NOTE — Evaluation (Signed)
Physical Therapy Evaluation Patient Details Name: Nancy Meadows MRN: [id number] DOB: October 18, 1932 Today's Date: 08/23/2019   History of Present Illness  86 year old African-American female with PMH sick sinus syndrome s/p PPM, chronic diastolic CHF, permanent A. fib, normocytic anemia, cholelithiasis, CKD 3, goiter/hypothyroidism, hyperlipidemia, bladder lesion with hematuria,  subdural hematoma who was brought to the ER with worsening shortness of breath. admitted with acute on chronic dCHF  Clinical Impression  Pt admitted with above diagnosis.  Mobility limited today to amb ~ 20' d/t fatigue and incr WOB. Pt with 3-4/4 dyspnea, HR max 120, SpO2=93-100% on RA. Recommend HHPT vs SNF depending on progress. Pt lives along and is independent at her baseline, she reports she can function from a w/c level at home if needed.    Pt currently with functional limitations due to the deficits listed below (see PT Problem List). Pt will benefit from skilled PT to increase their independence and safety with mobility to allow discharge to the venue listed below.       Follow Up Recommendations Home health PT (vs SNF pending progress)    Equipment Recommendations  None recommended by PT    Recommendations for Other Services       Precautions / Restrictions Precautions Precautions: Fall Precaution Comments: monitor HR/O2      Mobility  Bed Mobility               General bed mobility comments: pt on BSC on arrival  Transfers Overall transfer level: Needs assistance Equipment used: Rolling walker (2 wheeled) Transfers: Sit to/from Stand Sit to Stand: Min guard;Min assist         General transfer comment: cues for hand placement, min/guard for safety on standing, min assist to control descent and pt begins to sit before all the way to chair d/t fatigue  Ambulation/Gait Ambulation/Gait assistance: Min assist;Min guard Gait Distance (Feet): 20 Feet Assistive device: Rolling walker (2  wheeled) Gait Pattern/deviations: Step-through pattern;Decreased stride length;Trunk flexed     General Gait Details: cues for RW position, min to min/guard for balance and safety, overall unsteady gait,   SpO2= 93-100% on RA, HR max 120  Stairs            Wheelchair Mobility    Modified Rankin (Stroke Patients Only)       Balance Overall balance assessment: Needs assistance (denies falls in the last year)           Standing balance-Leahy Scale: Fair Standing balance comment: reliant on UEs for dynamic tasks                             Pertinent Vitals/Pain Pain Assessment: No/denies pain    Home Living Family/patient expects to be discharged to:: Private residence Living Arrangements: Alone Available Help at Discharge: Family Type of Home: Apartment Home Access: Level entry     Home Layout: One level Home Equipment: Environmental consultant - 4 wheels;Walker - 2 wheels;Wheelchair - manual;Grab bars - toilet;Grab bars - tub/shower;Tub bench Additional Comments: pt reports moved into senior apartment that is handicap accessible and is close to her son    Prior Function Level of Independence: Independent with assistive device(s)         Comments: reports I with cooking, laundry. amdb with rollator, reports she was gettign HHPT prior to admission     Hand Dominance        Extremity/Trunk Assessment   Upper Extremity Assessment Upper Extremity Assessment:  Generalized weakness    Lower Extremity Assessment Lower Extremity Assessment: Generalized weakness       Communication   Communication: No difficulties  Cognition Arousal/Alertness: Awake/alert Behavior During Therapy: WFL for tasks assessed/performed Overall Cognitive Status: Within Functional Limits for tasks assessed                                        General Comments      Exercises     Assessment/Plan    PT Assessment Patient needs continued PT services  PT Problem  List Decreased activity tolerance;Decreased strength;Decreased mobility;Decreased balance       PT Treatment Interventions Therapeutic exercise;DME instruction;Gait training;Therapeutic activities;Functional mobility training;Patient/family education    PT Goals (Current goals can be found in the Care Plan section)  Acute Rehab PT Goals Patient Stated Goal: home PT Goal Formulation: With patient Time For Goal Achievement: 09/06/19 Potential to Achieve Goals: Good    Frequency Min 3X/week   Barriers to discharge        Co-evaluation               AM-PAC PT "6 Clicks" Mobility  Outcome Measure Help needed turning from your back to your side while in a flat bed without using bedrails?: A Little Help needed moving from lying on your back to sitting on the side of a flat bed without using bedrails?: A Little Help needed moving to and from a bed to a chair (including a wheelchair)?: A Little Help needed standing up from a chair using your arms (e.g., wheelchair or bedside chair)?: A Little Help needed to walk in hospital room?: A Little Help needed climbing 3-5 steps with a railing? : A Lot 6 Click Score: 17    End of Session Equipment Utilized During Treatment: Gait belt Activity Tolerance: Patient limited by fatigue Patient left: with call bell/phone within reach;with chair alarm set   PT Visit Diagnosis: Difficulty in walking, not elsewhere classified (R26.2);Unsteadiness on feet (R26.81)    Time: 1140-1159 PT Time Calculation (min) (ACUTE ONLY): 19 min   Charges:   PT Evaluation $PT Eval Low Complexity: [place], PT  Acute Rehab Dept ([place]) [phone] Pager [phone]  08/23/2019   [facility] 08/23/2019, 12:11 PM

## 2019-08-23 NOTE — Discharge Summary (Signed)
Physician Discharge Summary  Nancy Meadows LKG:[id number] DOB: Jan 10, 1933 DOA: 08/22/2019  PCP: Nancy Seashore, MD  Admit date: 08/22/2019 Discharge date: 08/23/2019  Admitted From: home Disposition:  home Discharging physician: Nancy Dee, MD  Recommendations for Outpatient Follow-up:  1. Repeat BMP 1-2 days after discharge (discussed with Nancy Meadows, patient's son) 2. Follow up with Dr. Rayann Meadows, 08/24/19 3. Continue lasix; adjust further as needed  Patient discharged to homd in Discharge Condition: stable CODE STATUS: Full Diet recommendation:  Diet Orders (From admission, onward)    Start     Ordered   08/23/19 0000  Diet - low sodium heart healthy        08/23/19 1428   08/22/19 0511  Diet Heart Room service appropriate? Yes; Fluid consistency: Thin; Fluid restriction: 1200 mL Fluid  Diet effective now       Question Answer Comment  Room service appropriate? Yes   Fluid consistency: Thin   Fluid restriction: 1200 mL Fluid      08/22/19 0514          Hospital Course: Ms. Nancy Meadows is an 86 year old African-American female with PMH sick sinus syndrome s/p PPM, chronic diastolic CHF, permanent A. fib, normocytic anemia, cholelithiasis, CKD 3, goiter/hypothyroidism, hyperlipidemia, bladder lesion with hematuria and frequency, history of subdural hematoma who was brought to the ER with worsening shortness of breath.  She also had symptoms of orthopnea, PND, and worsening lower extremity edema. She also endorsed that for some reason she had not been taking her Lasix since Wednesday prior to admission.  She could not elaborate as to why she was holding it.  She also had reported to the admitting staff that she was taking in a moderate amount of fluids and salt intake. BNP was elevated 2155 on admission.  CXR revealed vascular congestion, and she had significant lower extremity pitting edema.  She was started on IV Lasix with good response and admitted for further IV diuresis. She ambulated  with physical therapy on 08/23/2019 and had no significant oxygen desaturation.  She did remain very dyspneic and labored with breathing.  After discussion with her son, this has been her baseline at home and possibly related partly to her underlying A. fib.  She has an upcoming appointment with Dr. Rayann Meadows on 08/24/2019 that she is also wishing to attend.  Her sodium did downtrend mildly during hospitalization.  Discussed this with her son on the day of discharge and recommended she have repeat BMP in the next 1 to 2 days.  He voiced understanding and says they have standing lab orders and can easily have labs checked on her outpatient.  He plans to do so either Tuesday or Wednesday.   Hypothyroidism -Continue Synthroid  HLD (hyperlipidemia) -Continue statin  Permanent atrial fibrillation ([place]) -Follows outpatient with cardiology -continue Eliquis -pacemaker in place -continue metoprolol -upcoming outpatient appointment on 08/24/2019 with Dr. Rayann Meadows  Acute on chronic diastolic congestive heart failure ([place]) -Multifactorial in setting of missing doses of Lasix at home the past few days as well as possible salt intake -responding well to IV Lasix, will continue -continue daily weights -Ambulated well with physical therapy with no oxygen desaturation. Considered stable for d/c home; discussed with son also prior to d/c - resumed lasix 40 mg PO daily at discharge   Hyponatremia -For now considering hypervolemic hyponatremia -monitor BMP while on diuresis - slight downtrend on lasix; informed patient's son to have repeat BMP in next 1-2 days, he voiced understanding   Hyperkalemia -Mild, no indication  for treatment.  Patient denies palpitations -currently on diuresis with Lasix, follow-up BMP in a.m. - resolved prior to dc   Equivocal immunity to COVID-19 virus -Patient states she received first dose of Pfizer vaccine in February 2021.  Had been waiting due to being sick intermittently  throughout the year for second dose -patient has been encouraged by cardiology to get second vaccine; also encouraged her to consider doing this as well.  At this point may possibly even need to repeat series altogether given uncertainty of immunity after 6 months at this time    The patient's chronic medical conditions were treated accordingly per the patient's home medication regimen except as noted.  On day of discharge, patient was felt deemed stable for discharge. Patient/family member advised to call PCP or come back to ER if needed.   Discharge Diagnoses:   Principal Diagnosis: Acute on chronic diastolic congestive heart failure [facility])  Active Hospital Problems   Diagnosis Date Noted  . Acute on chronic diastolic congestive heart failure ([place]) 08/22/2019    Priority: High  . Permanent atrial fibrillation ([place]) 08/04/2019    Priority: High  . Equivocal immunity to COVID-19 virus 08/22/2019  . Hyponatremia   . Normocytic anemia 10/22/2017  . Hypothyroidism 03/08/2010  . HLD (hyperlipidemia) 03/08/2010    Resolved Hospital Problems   Diagnosis Date Noted Date Resolved  . Hyperkalemia  08/23/2019    Discharge Instructions    Diet - low sodium heart healthy   Complete by: As directed    Increase activity slowly   Complete by: As directed      Allergies as of 08/23/2019      Reactions   Acetaminophen Other (See Comments)   Syncope   Meclizine Other (See Comments)   Increased dizziness and confusion      Medication List    TAKE these medications   apixaban 2.5 MG Tabs tablet Commonly known as: Eliquis Take 1 tablet (2.5 mg total) by mouth 2 (two) times daily.   aspirin EC 81 MG tablet Take 81 mg by mouth daily. Swallow whole.   furosemide 40 MG tablet Commonly known as: Lasix Take 1 tablet (40 mg total) by mouth daily.   levothyroxine 88 MCG tablet Commonly known as: SYNTHROID Take 88 mcg by mouth every morning.   metoprolol tartrate 25 MG tablet Commonly  known as: LOPRESSOR Take 12.5 mg by mouth 2 (two) times daily.   mirtazapine 7.5 MG tablet Commonly known as: REMERON Take 7.5 mg by mouth at bedtime.       Allergies  Allergen Reactions  . Acetaminophen Other (See Comments)    Syncope  . Meclizine Other (See Comments)    Increased dizziness and confusion    Discharge Exam: BP 92/60 (BP Location: Right Arm)   Pulse 63   Temp (!) 97.5 F (36.4 C) (Oral)   Resp (!) 21   Ht _0  (1.702 m)   Wt 63.6 kg   SpO2 100%   BMI 21.96 kg/m  General appearance: Pleasant elderly woman resting in bed in no distress Head: Normocephalic, without obvious abnormality, atraumatic Eyes: EOMI Lungs: clear to auscultation bilaterally Heart: irregularly irregular rhythm and S1, S2 normal Abdomen: normal findings: bowel sounds normal and soft, non-tender Extremities: L>R LE edema, 1+ Skin: mobility and turgor normal Neurologic: Grossly normal  The results of significant diagnostics from this hospitalization (including imaging, microbiology, ancillary and laboratory) are listed below for reference.   Microbiology: Recent Results (from the past 240 hour(s))  SARS Coronavirus 2 by RT PCR (hospital order, performed in [facility] hospital lab) Nasopharyngeal Nasopharyngeal Swab     Status: None   Collection Time: 08/22/19  2:57 AM   Specimen: Nasopharyngeal Swab  Result Value Ref Range Status   SARS Coronavirus 2 NEGATIVE NEGATIVE Final    Comment: (NOTE) SARS-CoV-2 target nucleic acids are NOT DETECTED.  The SARS-CoV-2 RNA is generally detectable in upper and lower respiratory specimens during the acute phase of infection. The lowest concentration of SARS-CoV-2 viral copies this assay can detect is 250 copies / mL. A negative result does not preclude SARS-CoV-2 infection and should not be used as the sole basis for treatment or other patient management decisions.  A negative result may occur with improper specimen collection / handling,  submission of specimen other than nasopharyngeal swab, presence of viral mutation(s) within the areas targeted by this assay, and inadequate number of viral copies (<250 copies / mL). A negative result must be combined with clinical observations, patient history, and epidemiological information.  Fact Sheet for Patients:   StrictlyIdeas.no  Fact Sheet for Healthcare Providers: BankingDealers.co.za  This test is not yet approved or  cleared by the [place] FDA and has been authorized for detection and/or diagnosis of SARS-CoV-2 by FDA under an Emergency Use Authorization (EUA).  This EUA will remain in effect (meaning this test can be used) for the duration of the COVID-19 declaration under Section 564(b)(1) of the Act, 21 U.S.C. section 360bbb-3(b)(1), unless the authorization is terminated or revoked sooner.  Performed at [facility], [place] [address], [place], [place] [address]      Labs: BNP (last 3 results) Recent Labs    06/08/19 0424 08/22/19 0212  BNP 660.6* 3,903.0*   Basic Metabolic Panel: Recent Labs  Lab 08/22/19 0212 08/23/19 0521  NA 127* 124*  K 5.2* 4.5  CL 97* 94*  CO2 20* 22  GLUCOSE 138* 118*  BUN 37* 38*  CREATININE 1.39* 1.32*  CALCIUM 9.3 8.7*  MG  --  2.4   Liver Function Tests: Recent Labs  Lab 08/22/19 0212  AST 31  ALT 20  ALKPHOS 75  BILITOT 3.0*  PROT 7.5  ALBUMIN 3.8   No results for input(s): LIPASE, AMYLASE in the last 168 hours. No results for input(s): AMMONIA in the last 168 hours. CBC: Recent Labs  Lab 08/22/19 0212 08/23/19 0521  WBC 5.6 6.8  NEUTROABS 4.1  --   HGB 9.2* 8.4*  HCT 28.3* 26.3*  MCV 94.6 95.3  PLT 199 193   Cardiac Enzymes: No results for input(s): CKTOTAL, CKMB, CKMBINDEX, TROPONINI in the last 168 hours. BNP: Invalid input(s): POCBNP CBG: No results for input(s): GLUCAP in the last 168 hours. D-Dimer No results for  input(s): DDIMER in the last 72 hours. Hgb A1c No results for input(s): HGBA1C in the last 72 hours. Lipid Profile No results for input(s): CHOL, HDL, LDLCALC, TRIG, CHOLHDL, LDLDIRECT in the last 72 hours. Thyroid function studies No results for input(s): TSH, T4TOTAL, T3FREE, THYROIDAB in the last 72 hours.  Invalid input(s): FREET3 Anemia work up No results for input(s): VITAMINB12, FOLATE, FERRITIN, TIBC, IRON, RETICCTPCT in the last 72 hours. Urinalysis    Component Value Date/Time   COLORURINE YELLOW 06/08/2019 0533   APPEARANCEUR CLEAR 06/08/2019 0533   LABSPEC 1.008 06/08/2019 0533   PHURINE 5.0 06/08/2019 0533   GLUCOSEU NEGATIVE 06/08/2019 0533   HGBUR MODERATE (A) 06/08/2019 0533   BILIRUBINUR NEGATIVE 06/08/2019 0533  KETONESUR NEGATIVE 06/08/2019 0533   PROTEINUR NEGATIVE 06/08/2019 0533   UROBILINOGEN 0.2 02/09/2012 0017   NITRITE NEGATIVE 06/08/2019 0533   LEUKOCYTESUR NEGATIVE 06/08/2019 0533   Sepsis Labs Invalid input(s): PROCALCITONIN,  WBC,  LACTICIDVEN Microbiology Recent Results (from the past 240 hour(s))  SARS Coronavirus 2 by RT PCR (hospital order, performed in [place] hospital lab) Nasopharyngeal Nasopharyngeal Swab     Status: None   Collection Time: 08/22/19  2:57 AM   Specimen: Nasopharyngeal Swab  Result Value Ref Range Status   SARS Coronavirus 2 NEGATIVE NEGATIVE Final    Comment: (NOTE) SARS-CoV-2 target nucleic acids are NOT DETECTED.  The SARS-CoV-2 RNA is generally detectable in upper and lower respiratory specimens during the acute phase of infection. The lowest concentration of SARS-CoV-2 viral copies this assay can detect is 250 copies / mL. A negative result does not preclude SARS-CoV-2 infection and should not be used as the sole basis for treatment or other patient management decisions.  A negative result may occur with improper specimen collection / handling, submission of specimen other than nasopharyngeal swab, presence  of viral mutation(s) within the areas targeted by this assay, and inadequate number of viral copies (<250 copies / mL). A negative result must be combined with clinical observations, patient history, and epidemiological information.  Fact Sheet for Patients:   StrictlyIdeas.no  Fact Sheet for Healthcare Providers: BankingDealers.co.za  This test is not yet approved or  cleared by the [place] FDA and has been authorized for detection and/or diagnosis of SARS-CoV-2 by FDA under an Emergency Use Authorization (EUA).  This EUA will remain in effect (meaning this test can be used) for the duration of the COVID-19 declaration under Section 564(b)(1) of the Act, 21 U.S.C. section 360bbb-3(b)(1), unless the authorization is terminated or revoked sooner.  Performed at [facility], [place] [address] [place], [place] [address]     Procedures/Studies: DG Chest 2 View  Result Date: 08/22/2019 CLINICAL DATA:  Shortness of breath EXAM: CHEST - 2 VIEW COMPARISON:  July 29, 2019 FINDINGS: The heart size and mediastinal contours are unchanged with mild cardiomegaly. Aortic knob calcifications are seen. Left-sided pacemaker is present. Again noted is airspace consolidation within the right upper lobe, which appears unchanged from the prior exam of July 29, 2018, however improved since Jun 08, 2019. No new airspace consolidation is noted. There is prominence of the central pulmonary vasculature. No pleural effusion. No acute osseous abnormality. IMPRESSION: 1. Mild cardiomegaly and pulmonary vascular congestion. 2. Unchanged airspace consolidation within the right upper lobe, however improved since Jun 08, 2019. This could be due to improving pneumonia Followup PA and lateral chest X-ray is recommended in 3-4 weeks following trial of antibiotic therapy to ensure resolution and exclude underlying malignancy. Electronically Signed   By: Prudencio Pair M.D.   On: 08/22/2019 02:39   DG Chest Port 1 View  Result Date: 07/29/2019 CLINICAL DATA:  Weakness, nausea, tachycardia EXAM: PORTABLE CHEST 1 VIEW COMPARISON:  06/08/2019 FINDINGS: Single frontal view of the chest demonstrates stable dual lead pacemaker. The cardiac silhouette remains enlarged. There is persistent right upper lobe consolidation with volume loss, with slight improvement since prior study. No effusion or pneumothorax. No acute bony abnormalities. IMPRESSION: 1. Persistent but improving right upper lobe consolidation. The appearance is consistent with a resolving pneumonia. Please note that it may take 6-8 weeks for complete radiographic resolution after completion of appropriate medical management. 2. Stable cardiomegaly. Electronically Signed   By: Legrand Como  Owens Shark M.D.   On: 07/29/2019 22:10     Time coordinating discharge: Over 30 minutes    Nancy Dee, MD  [facility] 08/23/2019, 2:32 PM Pager: Secure chat  If 7PM-7AM, please contact night-coverage [email or web address] Password [email or web address]

## 2019-08-23 NOTE — TOC Initial Note (Signed)
Transition of Care [facility]) - Initial/Assessment Note    Patient Details  Name: Nancy Meadows MRN: [id number] Date of Birth: [birth year]  [facility] CM/SW Contact:    Joaquin Courts, RN Phone Number: 08/23/2019, 3:10 PM  Clinical Narrative:  CM spoke with patient and son regarding recommendation for HHPT. Son reports patient currently active with [facility]. Agency confirms patient is active for [facility] and PT.  Resumption of care orders and supporting clinicals sent to agency.                  Expected Discharge Plan: [place] Barriers to Discharge: No Barriers Identified   Patient Goals and CMS Choice Patient states their goals for this hospitalization and ongoing recovery are:: to go home with therapy CMS Medicare.gov Compare Post Acute Care list provided to:: Patient Choice offered to / list presented to : Patient, Adult Children  Expected Discharge Plan and Services Expected Discharge Plan: [place]   Discharge Planning Services: CM Consult Post Acute Care Choice: Resumption of Svcs/PTA Provider Living arrangements for the past 2 months: Single Family Home Expected Discharge Date: 08/23/19               DME Arranged: N/A DME Agency: NA       HH Arranged: PT, RN [place] Agency: [place] Date [place]: 08/23/19 Time [place]: 1509 Representative spoke with at [place]: intake  Prior Living Arrangements/Services Living arrangements for the past 2 months: [place]   Patient language and need for interpreter reviewed:: Yes Do you feel safe going back to the place where you live?: Yes      Need for Family Participation in Patient Care: Yes (Comment) Care giver support system in place?: Yes (comment)   Criminal Activity/Legal Involvement Pertinent to Current Situation/Hospitalization: No - Comment as needed  Activities of Daily Living Home Assistive Devices/Equipment:  Eyeglasses, Shower chair with back, Environmental consultant (specify type) ADL Screening (condition at time of admission) Patient's cognitive ability adequate to safely complete daily activities?: Yes Is the patient deaf or have difficulty hearing?: No Does the patient have difficulty seeing, even when wearing glasses/contacts?: No Does the patient have difficulty concentrating, remembering, or making decisions?: Yes Patient able to express need for assistance with ADLs?: Yes Does the patient have difficulty dressing or bathing?: Yes Independently performs ADLs?: Yes (appropriate for developmental age) Does the patient have difficulty walking or climbing stairs?: Yes Weakness of Legs: Both Weakness of Arms/Hands: None  Permission Sought/Granted                  Emotional Assessment Appearance:: Appears stated age Attitude/Demeanor/Rapport: Engaged Affect (typically observed): Accepting Orientation: : Oriented to Self, Oriented to Place, Oriented to  Time, Oriented to Situation   Psych Involvement: No (comment)  Admission diagnosis:  Acute on chronic diastolic congestive heart failure (HCC) [I50.33] Chest pain [R07.9] Patient Active Problem List   Diagnosis Date Noted  . Acute on chronic diastolic congestive heart failure ([place]) 08/22/2019  . Equivocal immunity to COVID-19 virus 08/22/2019  . Hyponatremia   . Permanent atrial fibrillation ([place]) 08/04/2019  . Secondary hypercoagulable state ([place]) 08/04/2019  . Acute diastolic CHF (congestive heart failure) ([place]) 06/10/2019  . Acute on chronic diastolic CHF (congestive heart failure) ([place]) 06/10/2019  . Fall   . Severe mitral regurgitation   . Compression fracture of first lumbar vertebra ([place]) 06/08/2019  . Closed compression  fracture of L3 lumbar vertebra, initial encounter ([place]) 06/08/2019  . Dyspnea 03/27/2018  . CKD (chronic kidney disease) stage 3, GFR 30-59 ml/min 03/27/2018  . AF (paroxysmal atrial fibrillation) ([place]) 03/27/2018  .  Hypoxia   . Atrial fibrillation with RVR ([place])   . Acute respiratory failure ([place]) 10/22/2017  . Pleural effusion, bilateral 10/22/2017  . Normocytic anemia 10/22/2017  . Premature atrial contraction 09/05/2014  . Sick sinus syndrome ([place]) 03/07/2013  . Palpitations 08/17/2012  . Syncope 02/09/2012  . Acute head trauma 02/09/2012  . Subdural hematoma, acute ([place]) 02/09/2012  . SAH (subarachnoid hemorrhage) ([place]) 02/09/2012  . Hypothyroidism 03/08/2010  . HLD (hyperlipidemia) 03/08/2010  . CARDIAC MURMUR 03/08/2010  . CHEST PAIN 03/08/2010  . DIZZINESS 03/07/2010   PCP:  Merrilee Seashore, MD Pharmacy:   [id number] [place], [id number]: [phone]: [phone] [phone] [place] [id number]: [phone]: [phone]    Social Determinants of Health (SDOH) Interventions    Readmission Risk Interventions No flowsheet data found.

## 2019-08-23 NOTE — Progress Notes (Signed)
Discharge instructions given. Patient verbalized understanding and all questions were answered.  ?

## 2019-08-24 ENCOUNTER — Ambulatory Visit: Payer: Medicare HMO | Admitting: Internal Medicine

## 2019-08-24 ENCOUNTER — Encounter: Payer: Self-pay | Admitting: Internal Medicine

## 2019-08-24 ENCOUNTER — Other Ambulatory Visit: Payer: Self-pay

## 2019-08-24 VITALS — BP 108/60 | HR 91 | Ht 67.0 in | Wt 148.0 lb

## 2019-08-24 DIAGNOSIS — Z950 Presence of cardiac pacemaker: Secondary | ICD-10-CM

## 2019-08-24 DIAGNOSIS — I480 Paroxysmal atrial fibrillation: Secondary | ICD-10-CM | POA: Diagnosis not present

## 2019-08-24 DIAGNOSIS — I10 Essential (primary) hypertension: Secondary | ICD-10-CM | POA: Diagnosis not present

## 2019-08-24 DIAGNOSIS — I495 Sick sinus syndrome: Secondary | ICD-10-CM | POA: Diagnosis not present

## 2019-08-24 NOTE — Patient Instructions (Addendum)
Medication Instructions:  Your physician recommends that you continue on your current medications as directed. Please refer to the Current Medication list given to you today.  Labwork: None ordered.  Testing/Procedures: None ordered.  Follow-Up:  SEE INSTRUCTION LETTER  Remote monitoring is used to monitor your Pacemaker from home. This monitoring reduces the number of office visits required to check your device to one time per year. It allows [place] to keep an eye on the functioning of your device to ensure it is working properly. You are scheduled for a device check from home on 09/22/2019. You may send your transmission at any time that day. If you have a wireless device, the transmission will be sent automatically. After your physician reviews your transmission, you will receive a postcard with your next transmission date.  Any Other Special Instructions Will Be Listed Below (If Applicable).  If you need a refill on your cardiac medications before your next appointment, please call your pharmacy.    Cardiac Ablation Cardiac ablation is a procedure to disable (ablate) a small amount of heart tissue in very specific places. The heart has many electrical connections. Sometimes these connections are abnormal and can cause the heart to beat very fast or irregularly. Ablating some of the problem areas can improve the heart rhythm or return it to normal. Ablation may be done for people who:  Have Wolff-Parkinson-White syndrome.  Have fast heart rhythms (tachycardia).  Have taken medicines for an abnormal heart rhythm (arrhythmia) that were not effective or caused side effects.  Have a high-risk heartbeat that may be life-threatening. During the procedure, a small incision is made in the neck or the groin, and a long, thin, flexible tube (catheter) is inserted into the incision and moved to the heart. Small devices (electrodes) on the tip of the catheter will send out electrical currents. A type  of X-ray (fluoroscopy) will be used to help guide the catheter and to provide images of the heart. Tell a health care provider about:  Any allergies you have.  All medicines you are taking, including vitamins, herbs, eye drops, creams, and over-the-counter medicines.  Any problems you or family members have had with anesthetic medicines.  Any blood disorders you have.  Any surgeries you have had.  Any medical conditions you have, such as kidney failure.  Whether you are pregnant or may be pregnant. What are the risks? Generally, this is a safe procedure. However, problems may occur, including:  Infection.  Bruising and bleeding at the catheter insertion site.  Bleeding into the chest, especially into the sac that surrounds the heart. This is a serious complication.  Stroke or blood clots.  Damage to other structures or organs.  Allergic reaction to medicines or dyes.  Need for a permanent pacemaker if the normal electrical system is damaged. A pacemaker is a small computer that sends electrical signals to the heart and helps your heart beat normally.  The procedure not being fully effective. This may not be recognized until months later. Repeat ablation procedures are sometimes required. What happens before the procedure?  Follow instructions from your health care provider about eating or drinking restrictions.  Ask your health care provider about: ? Changing or stopping your regular medicines. This is especially important if you are taking diabetes medicines or blood thinners. ? Taking medicines such as aspirin and ibuprofen. These medicines can thin your blood. Do not take these medicines before your procedure if your health care provider instructs you not to.  Plan  to have someone take you home from the hospital or clinic.  If you will be going home right after the procedure, plan to have someone with you for 24 hours. What happens during the procedure?  To lower  your risk of infection: ? Your health care team will wash or sanitize their hands. ? Your skin will be washed with soap. ? Hair may be removed from the incision area.  An IV tube will be inserted into one of your veins.  You will be given a medicine to help you relax (sedative).  The skin on your neck or groin will be numbed.  An incision will be made in your neck or your groin.  A needle will be inserted through the incision and into a large vein in your neck or groin.  A catheter will be inserted into the needle and moved to your heart.  Dye may be injected through the catheter to help your surgeon see the area of the heart that needs treatment.  Electrical currents will be sent from the catheter to ablate heart tissue in desired areas. There are three types of energy that may be used to ablate heart tissue: ? Heat (radiofrequency energy). ? Laser energy. ? Extreme cold (cryoablation).  When the necessary tissue has been ablated, the catheter will be removed.  Pressure will be held on the catheter insertion area to prevent excessive bleeding.  A bandage (dressing) will be placed over the catheter insertion area. The procedure may vary among health care providers and hospitals. What happens after the procedure?  Your blood pressure, heart rate, breathing rate, and blood oxygen level will be monitored until the medicines you were given have worn off.  Your catheter insertion area will be monitored for bleeding. You will need to lie still for a few hours to ensure that you do not bleed from the catheter insertion area.  Do not drive for 24 hours or as long as directed by your health care provider. Summary  Cardiac ablation is a procedure to disable (ablate) a small amount of heart tissue in very specific places. Ablating some of the problem areas can improve the heart rhythm or return it to normal.  During the procedure, electrical currents will be sent from the catheter to  ablate heart tissue in desired areas. This information is not intended to replace advice given to you by your health care provider. Make sure you discuss any questions you have with your health care provider. Document Revised: 06/23/2017 Document Reviewed: 11/20/2015 Elsevier Patient Education  [place].

## 2019-08-24 NOTE — Progress Notes (Signed)
PCP: Rowe Pavy, MD   Primary EP:  Dr Rayann Heman  Nobie Putnam Mckim is a 86 y.o. female who presents today for routine electrophysiology followup.  Since last being seen in our clinic, the patient reports doing reasonably well.  She is s/p mitra clip at [facility] 07/21/19.  She has had some issues wth hypotension related to her rate controlling medicines.  She has heart rates 70s-120s. She has permanent afib and is on eliquis for stroke prevention.   She has chronic SOB and edema.  Today, she denies symptoms of palpitations, chest pain,  dizziness, presyncope, or syncope.  The patient is otherwise without complaint today.   Past Medical History:  Diagnosis Date  . Acute head trauma 02/09/2012  . Acute respiratory failure ([place]) 10/22/2017  . Anemia 10/22/2017  . Arthritis    ddd-- occ. pain  . Cholelithiasis   . CKD (chronic kidney disease) stage 3, GFR 30-59 ml/min 03/27/2018  . DIZZINESS 03/07/2010   Qualifier: Diagnosis of  By: Burnett Kanaris    . Dyspnea 03/27/2018  . Eczema   . Goiter   . HLD (hyperlipidemia)   . Hx of cardiovascular stress test    a. ETT-MV 7/14:  Low risk, ant defect c/w soft tissue atten., no ischemia, EF 77%  . HYPERLIPIDEMIA 03/08/2010   Qualifier: Diagnosis of  By: Stanford Breed, MD, Kandyce Rud   . Hypothyroidism 03/08/2010   Qualifier: Diagnosis of  By: Stanford Breed, MD, Kandyce Rud   . Hypoxia   . Lesion of bladder    hematuria/ frequency  . Palpitations 08/17/2012  . Paroxysmal atrial fibrillation ([place]) 01/2016   cahds2vasc sore is 3.  consider anticoagulation if afib burden increases  . Pleural effusion, bilateral 10/22/2017  . SAH (subarachnoid hemorrhage) ([place]) 02/09/2012  . SDH (subdural hematoma) ([facility])    in the setting of syncope and fall prior to PPM implant, now resolved  . Sick sinus syndrome ([place])   . Subdural hematoma, acute ([place]) 02/09/2012  . Syncope    pauses of >4 seconds   Past Surgical History:  Procedure Laterality  Date  . ABDOMINAL HYSTERECTOMY  age 84  . APPENDECTOMY  age 4  . bilateral salpingoophectomy  age 35  . CATARACT EXTRACTION W/ INTRAOCULAR LENS  IMPLANT, BILATERAL  2012  . CYSTOSCOPY WITH BIOPSY  11/21/2010   Procedure: CYSTOSCOPY WITH BIOPSY;  Surgeon: Molli Hazard, MD;  Location: [facility];  Service: Urology;  Laterality: N/A;  CYSTOSCOPY WITH BLADDER BIOPSY  AND INSTILLATION OF INDIGO CARMINE  . CYSTOSCOPY/RETROGRADE/URETEROSCOPY  11/21/2010   Procedure: CYSTOSCOPY/RETROGRADE/URETEROSCOPY;  Surgeon: Molli Hazard, MD;  Location: [facility];  Service: Urology;  Laterality: Left;  . gallstones removed  age 43  . LOOP RECORDER IMPLANT N/A 12/31/2012   Procedure: LOOP RECORDER IMPLANT;  Surgeon: Coralyn Mark, MD;  Location: [place] CATH LAB;  Service: Cardiovascular;  Laterality: N/A;  . PACEMAKER INSERTION  05-20-2013   MDT ADDRL1 pacemaker implanted by Dr Rayann Heman for SSS and symptomatic bradycardia  . PERMANENT PACEMAKER INSERTION Left 05/20/2013   Procedure: PERMANENT PACEMAKER INSERTION;  Surgeon: Coralyn Mark, MD;  Location: [facility] CATH LAB;  Service: Cardiovascular;  Laterality: Left;    ROS- all systems are reviewed and negative except as per HPI above  Current Outpatient Medications  Medication Sig Dispense Refill  . apixaban (ELIQUIS) 2.5 MG TABS tablet Take 1 tablet (2.5 mg total) by mouth 2 (two) times daily. 60 tablet 5  .  aspirin EC 81 MG tablet Take 81 mg by mouth daily. Swallow whole.    . furosemide (LASIX) 20 MG tablet Take 20 mg by mouth 2 (two) times daily.    Marland Kitchen levothyroxine (SYNTHROID, LEVOTHROID) 88 MCG tablet Take 88 mcg by mouth every morning.     . metoprolol tartrate (LOPRESSOR) 25 MG tablet Take 12.5 mg by mouth 2 (two) times daily.    . potassium chloride (KLOR-CON) 10 MEQ tablet Take 10 mEq by mouth daily.     No current facility-administered medications for this visit.    Physical Exam: Vitals:   08/24/19 1421  BP:  108/60  Pulse: 91  SpO2: 100%  Weight: 148 lb (67.1 kg)  Height: 5' 7" (1.702 m)     GEN- The patient is elderly appearing, alert and oriented x 3 today.   Head- normocephalic, atraumatic Eyes-  Sclera clear, conjunctiva pink Ears- hearing intact Oropharynx- clear Lungs-  , normal work of breathing Chest- pacemaker pocket is well healed Heart- irregular rate and rhythm  GI- soft  Extremities- no clubbing, cyanosis, + edema  Pacemaker interrogation- reviewed in detail today,  See PACEART report  ekg tracing ordered today is personally reviewed and shows afib with elevated V rates  Assessment and Plan:  1. Symptomatic sinus bradycardia  Normal pacemaker function See Pace Art report No changes today she is not device dependant today  2. Permanent afib V rates by device interrogation are elevated Today, we discussed AV nodal ablation at length Risks and benefits to AV nodal ablation were discussed at length with patient and son.  They are aware that risks include but are not limited to stroke, bleeding, vascular damage, tamponade, perforation, damage to the heart and other structures,  worsening renal function, and death. The patient understands these risk and wishes to proceed.  We also discussed that she will likely be device dependant post procedure. Continue eliquis for chads2vasc score of 4  3. HTN Stable No change required today  4. Mitral regurgitation S/p mitra clip at [facility] 07/21/19  5. Chronic combined systolic/ diastolic dysfunction  EF 00-41% by echo at [facility] recently Medical therapy has been limited by low BP.  I worry that given her advanced age that her prognosis is guarded.  Risks, benefits and potential toxicities for medications prescribed and/or refilled reviewed with patient today.   Thompson Grayer MD, [facility] 08/24/2019 2:44 PM

## 2019-08-26 ENCOUNTER — Telehealth: Payer: Self-pay | Admitting: *Deleted

## 2019-08-26 NOTE — Addendum Note (Signed)
Addended by: Mendel Ryder on: 08/26/2019 08:17 AM   Modules accepted: Orders

## 2019-08-26 NOTE — Telephone Encounter (Signed)
Called and updated with new arrival time for the procedure Sep 14, 2019 at 12:30.  Verbalized understanding

## 2019-08-28 DIAGNOSIS — J9601 Acute respiratory failure with hypoxia: Secondary | ICD-10-CM | POA: Diagnosis not present

## 2019-08-28 DIAGNOSIS — R0602 Shortness of breath: Secondary | ICD-10-CM | POA: Diagnosis not present

## 2019-08-28 DIAGNOSIS — R918 Other nonspecific abnormal finding of lung field: Secondary | ICD-10-CM | POA: Diagnosis not present

## 2019-08-29 DIAGNOSIS — J9601 Acute respiratory failure with hypoxia: Secondary | ICD-10-CM | POA: Diagnosis not present

## 2019-08-30 DIAGNOSIS — I340 Nonrheumatic mitral (valve) insufficiency: Secondary | ICD-10-CM | POA: Diagnosis not present

## 2019-08-30 DIAGNOSIS — E785 Hyperlipidemia, unspecified: Secondary | ICD-10-CM | POA: Diagnosis not present

## 2019-08-30 DIAGNOSIS — J90 Pleural effusion, not elsewhere classified: Secondary | ICD-10-CM | POA: Diagnosis not present

## 2019-08-30 DIAGNOSIS — J9601 Acute respiratory failure with hypoxia: Secondary | ICD-10-CM | POA: Diagnosis not present

## 2019-08-30 DIAGNOSIS — I4821 Permanent atrial fibrillation: Secondary | ICD-10-CM | POA: Diagnosis not present

## 2019-08-30 DIAGNOSIS — J181 Lobar pneumonia, unspecified organism: Secondary | ICD-10-CM | POA: Diagnosis not present

## 2019-08-30 DIAGNOSIS — R918 Other nonspecific abnormal finding of lung field: Secondary | ICD-10-CM | POA: Diagnosis not present

## 2019-08-30 DIAGNOSIS — N183 Chronic kidney disease, stage 3 unspecified: Secondary | ICD-10-CM | POA: Diagnosis not present

## 2019-08-30 DIAGNOSIS — E049 Nontoxic goiter, unspecified: Secondary | ICD-10-CM | POA: Diagnosis not present

## 2019-08-30 DIAGNOSIS — J189 Pneumonia, unspecified organism: Secondary | ICD-10-CM | POA: Diagnosis not present

## 2019-08-30 DIAGNOSIS — E875 Hyperkalemia: Secondary | ICD-10-CM | POA: Diagnosis not present

## 2019-08-30 DIAGNOSIS — I5043 Acute on chronic combined systolic (congestive) and diastolic (congestive) heart failure: Secondary | ICD-10-CM | POA: Diagnosis not present

## 2019-08-30 DIAGNOSIS — E039 Hypothyroidism, unspecified: Secondary | ICD-10-CM | POA: Diagnosis not present

## 2019-09-07 DIAGNOSIS — R9389 Abnormal findings on diagnostic imaging of other specified body structures: Secondary | ICD-10-CM | POA: Diagnosis not present

## 2019-09-07 DIAGNOSIS — J9601 Acute respiratory failure with hypoxia: Secondary | ICD-10-CM | POA: Diagnosis not present

## 2019-09-08 DIAGNOSIS — I5043 Acute on chronic combined systolic (congestive) and diastolic (congestive) heart failure: Secondary | ICD-10-CM | POA: Diagnosis not present

## 2019-09-08 DIAGNOSIS — J181 Lobar pneumonia, unspecified organism: Secondary | ICD-10-CM | POA: Diagnosis not present

## 2019-09-08 DIAGNOSIS — D631 Anemia in chronic kidney disease: Secondary | ICD-10-CM | POA: Diagnosis not present

## 2019-09-08 DIAGNOSIS — N183 Chronic kidney disease, stage 3 unspecified: Secondary | ICD-10-CM | POA: Diagnosis not present

## 2019-09-08 DIAGNOSIS — I130 Hypertensive heart and chronic kidney disease with heart failure and stage 1 through stage 4 chronic kidney disease, or unspecified chronic kidney disease: Secondary | ICD-10-CM | POA: Diagnosis not present

## 2019-09-08 DIAGNOSIS — I4892 Unspecified atrial flutter: Secondary | ICD-10-CM | POA: Diagnosis not present

## 2019-09-08 DIAGNOSIS — I482 Chronic atrial fibrillation, unspecified: Secondary | ICD-10-CM | POA: Diagnosis not present

## 2019-09-08 DIAGNOSIS — I959 Hypotension, unspecified: Secondary | ICD-10-CM | POA: Diagnosis not present

## 2019-09-08 DIAGNOSIS — I340 Nonrheumatic mitral (valve) insufficiency: Secondary | ICD-10-CM | POA: Diagnosis not present

## 2019-09-08 DIAGNOSIS — R5381 Other malaise: Secondary | ICD-10-CM | POA: Diagnosis not present

## 2019-09-09 ENCOUNTER — Telehealth: Payer: Self-pay | Admitting: Internal Medicine

## 2019-09-09 NOTE — Telephone Encounter (Signed)
Patient's son, Terie Purser states that patient is scheduled to have an ablation on 8/31, but he is wondering if she is too weak for the procedure. She is sleeping a lot, not eating, however her vitals are good.

## 2019-09-10 DIAGNOSIS — J181 Lobar pneumonia, unspecified organism: Secondary | ICD-10-CM | POA: Diagnosis not present

## 2019-09-10 DIAGNOSIS — I4892 Unspecified atrial flutter: Secondary | ICD-10-CM | POA: Diagnosis not present

## 2019-09-10 DIAGNOSIS — I5043 Acute on chronic combined systolic (congestive) and diastolic (congestive) heart failure: Secondary | ICD-10-CM | POA: Diagnosis not present

## 2019-09-10 DIAGNOSIS — I340 Nonrheumatic mitral (valve) insufficiency: Secondary | ICD-10-CM | POA: Diagnosis not present

## 2019-09-10 DIAGNOSIS — R5381 Other malaise: Secondary | ICD-10-CM | POA: Diagnosis not present

## 2019-09-10 DIAGNOSIS — N183 Chronic kidney disease, stage 3 unspecified: Secondary | ICD-10-CM | POA: Diagnosis not present

## 2019-09-10 DIAGNOSIS — I482 Chronic atrial fibrillation, unspecified: Secondary | ICD-10-CM | POA: Diagnosis not present

## 2019-09-10 DIAGNOSIS — D631 Anemia in chronic kidney disease: Secondary | ICD-10-CM | POA: Diagnosis not present

## 2019-09-10 DIAGNOSIS — I959 Hypotension, unspecified: Secondary | ICD-10-CM | POA: Diagnosis not present

## 2019-09-10 DIAGNOSIS — I130 Hypertensive heart and chronic kidney disease with heart failure and stage 1 through stage 4 chronic kidney disease, or unspecified chronic kidney disease: Secondary | ICD-10-CM | POA: Diagnosis not present

## 2019-09-10 NOTE — Telephone Encounter (Signed)
Returned call to [place] and we dicussed that the weakness, sleeping, and not eating a lot is not new for his mother.   Tanna Savoy that patient is okay to have the procedure.   During conversation Terie Purser has dicussed going to sit down with his mother today and ask her if she feels like going through with this procedure. " I will fight with her if she wants to fight."   Terie Purser to call back if Mrs. [place] wants to cancel procedure. IF no call will go forward as planned.

## 2019-09-10 NOTE — Telephone Encounter (Signed)
Son Nancy Meadows following up on earlier call concerning the procedure 8/31 pt still weak and really wants to know?

## 2019-09-11 ENCOUNTER — Inpatient Hospital Stay (HOSPITAL_COMMUNITY): Admission: RE | Admit: 2019-09-11 | Payer: Medicare HMO | Source: Ambulatory Visit

## 2019-09-12 DIAGNOSIS — Z20822 Contact with and (suspected) exposure to covid-19: Secondary | ICD-10-CM | POA: Diagnosis not present

## 2019-09-12 DIAGNOSIS — U071 COVID-19: Secondary | ICD-10-CM | POA: Diagnosis not present

## 2019-09-13 ENCOUNTER — Other Ambulatory Visit (HOSPITAL_COMMUNITY)
Admission: RE | Admit: 2019-09-13 | Discharge: 2019-09-13 | Disposition: A | Discharge status: Home / Self Care | Payer: Medicare HMO | Source: Ambulatory Visit | Attending: Internal Medicine | Admitting: Internal Medicine

## 2019-09-13 DIAGNOSIS — U071 COVID-19: Secondary | ICD-10-CM | POA: Insufficient documentation

## 2019-09-13 DIAGNOSIS — Z01812 Encounter for preprocedural laboratory examination: Secondary | ICD-10-CM | POA: Diagnosis not present

## 2019-09-13 NOTE — Progress Notes (Signed)
Instructed patient on the following items: Arrival time 1230 Nothing to eat or drink after midnight No meds AM of procedure Responsible person to drive you home and stay with you for 24 hrs  Have you missed any doses of anti-coagulant Eliquis- patient didn't take am doses, discussed with Dr Rayann Heman, patient is to take Pm dose tonight, but none in the AM

## 2019-09-14 ENCOUNTER — Ambulatory Visit (HOSPITAL_COMMUNITY): Admission: RE | Admit: 2019-09-14 | Payer: Medicare HMO | Source: Home / Self Care | Admitting: Internal Medicine

## 2019-09-14 ENCOUNTER — Encounter (HOSPITAL_COMMUNITY): Admission: RE | Payer: Medicare HMO | Source: Home / Self Care

## 2019-09-14 LAB — SARS CORONAVIRUS 2 (TAT 6-24 HRS): SARS Coronavirus 2: POSITIVE — AB

## 2019-09-14 SURGERY — AV NODE ABLATION

## 2019-09-14 NOTE — Progress Notes (Signed)
Called patient's son Skyy Nilan, informed him that his mom's covid test was +.  Her procedure is cancelled.  The office will come him to reschedule.  She is not currently having any symptoms.  He states she is only short of breath, she has afib he states.  Told him to quarantine for 10 day and if she starts to develop any symptoms to please call her primary MD

## 2019-09-14 NOTE — Progress Notes (Signed)
Notified Dr Rayann Heman that patient's pre procdure covid test is +.  Procedure cancelled will reschedule for 4 weeks later.  Will call the patient at 8am.

## 2019-09-15 ENCOUNTER — Telehealth: Payer: Self-pay | Admitting: Infectious Diseases

## 2019-09-15 NOTE — Telephone Encounter (Signed)
Called to Discuss with patient about Covid symptoms and the use of the monoclonal antibody infusion for those with mild to moderate Covid symptoms and at a high risk of hospitalization.     Pt appears to qualify for this infusion due to co-morbid conditions and/or a member of an at-risk group in accordance with the FDA Emergency Use Authorization.    Speaking with her son she had a recent hospital stay for community acquired pneumonia. She was treated in the hospital for 3 days until she felt better and then was discharged home.   She was supposed to be having an AV node ablation with Dr. Rayann Heman and the pre-procedure testing and COVID test positive. He questions the results as she is doing well and has had no contact with anyone.  She does have some coughing when she lays flat. No fevers, chills. She is isolated in her independent living room at her local - she has no contact with other residence at this facility. She has a low appetite current  She had one dose of Pfizer - had an acute illness after this and that scared her to get the second one.  He would like to discuss with her PCP and follow her for any symptoms.

## 2019-09-22 ENCOUNTER — Telehealth: Payer: Self-pay | Admitting: Internal Medicine

## 2019-09-22 DIAGNOSIS — I480 Paroxysmal atrial fibrillation: Secondary | ICD-10-CM

## 2019-09-22 NOTE — Telephone Encounter (Signed)
New Message   Patients son is calling because the patient was to have a AV Node Ablation done that was cancelled. He would like to know when it will be rescheduled because the patient is not doing well.

## 2019-09-24 ENCOUNTER — Encounter: Payer: Self-pay | Admitting: *Deleted

## 2019-09-24 NOTE — Addendum Note (Signed)
Addended by: Darrell Jewel on: 09/24/2019 01:21 PM   Modules accepted: Orders

## 2019-09-24 NOTE — Telephone Encounter (Signed)
Procedure rescheduled along with labs and covid testing.

## 2019-10-01 ENCOUNTER — Other Ambulatory Visit: Payer: Self-pay

## 2019-10-01 ENCOUNTER — Other Ambulatory Visit: Payer: Medicare HMO | Admitting: *Deleted

## 2019-10-01 DIAGNOSIS — I480 Paroxysmal atrial fibrillation: Secondary | ICD-10-CM

## 2019-10-02 LAB — CBC WITH DIFFERENTIAL/PLATELET
Basophils Absolute: 0 10*3/uL (ref 0.0–0.2)
Basos: 1 %
EOS (ABSOLUTE): 0.1 10*3/uL (ref 0.0–0.4)
Eos: 1 %
Hematocrit: 30.8 % — ABNORMAL LOW (ref 34.0–46.6)
Hemoglobin: 9.6 g/dL — ABNORMAL LOW (ref 11.1–15.9)
Immature Grans (Abs): 0 10*3/uL (ref 0.0–0.1)
Immature Granulocytes: 0 %
Lymphocytes Absolute: 1.4 10*3/uL (ref 0.7–3.1)
Lymphs: 28 %
MCH: 28.2 pg (ref 26.6–33.0)
MCHC: 31.2 g/dL — ABNORMAL LOW (ref 31.5–35.7)
MCV: 90 fL (ref 79–97)
Monocytes Absolute: 0.5 10*3/uL (ref 0.1–0.9)
Monocytes: 9 %
Neutrophils Absolute: 3.1 10*3/uL (ref 1.4–7.0)
Neutrophils: 61 %
Platelets: 116 10*3/uL — ABNORMAL LOW (ref 150–450)
RBC: 3.41 x10E6/uL — ABNORMAL LOW (ref 3.77–5.28)
RDW: 19.8 % — ABNORMAL HIGH (ref 11.7–15.4)
WBC: 5.1 10*3/uL (ref 3.4–10.8)

## 2019-10-02 LAB — BASIC METABOLIC PANEL
BUN/Creatinine Ratio: 13 (ref 12–28)
BUN: 14 mg/dL (ref 8–27)
CO2: 25 mmol/L (ref 20–29)
Calcium: 8.7 mg/dL (ref 8.7–10.3)
Chloride: 100 mmol/L (ref 96–106)
Creatinine, Ser: 1.04 mg/dL — ABNORMAL HIGH (ref 0.57–1.00)
GFR calc Af Amer: 57 mL/min/{1.73_m2} — ABNORMAL LOW (ref >59)
GFR calc non Af Amer: 49 mL/min/{1.73_m2} — ABNORMAL LOW (ref >59)
Glucose: 92 mg/dL (ref 65–99)
Potassium: 4.1 mmol/L (ref 3.5–5.2)
Sodium: 137 mmol/L (ref 134–144)

## 2019-10-05 DIAGNOSIS — D631 Anemia in chronic kidney disease: Secondary | ICD-10-CM | POA: Diagnosis not present

## 2019-10-05 DIAGNOSIS — R5381 Other malaise: Secondary | ICD-10-CM | POA: Diagnosis not present

## 2019-10-05 DIAGNOSIS — I959 Hypotension, unspecified: Secondary | ICD-10-CM | POA: Diagnosis not present

## 2019-10-05 DIAGNOSIS — I482 Chronic atrial fibrillation, unspecified: Secondary | ICD-10-CM | POA: Diagnosis not present

## 2019-10-05 DIAGNOSIS — I4892 Unspecified atrial flutter: Secondary | ICD-10-CM | POA: Diagnosis not present

## 2019-10-05 DIAGNOSIS — N183 Chronic kidney disease, stage 3 unspecified: Secondary | ICD-10-CM | POA: Diagnosis not present

## 2019-10-05 DIAGNOSIS — J181 Lobar pneumonia, unspecified organism: Secondary | ICD-10-CM | POA: Diagnosis not present

## 2019-10-05 DIAGNOSIS — I130 Hypertensive heart and chronic kidney disease with heart failure and stage 1 through stage 4 chronic kidney disease, or unspecified chronic kidney disease: Secondary | ICD-10-CM | POA: Diagnosis not present

## 2019-10-05 DIAGNOSIS — I5043 Acute on chronic combined systolic (congestive) and diastolic (congestive) heart failure: Secondary | ICD-10-CM | POA: Diagnosis not present

## 2019-10-05 DIAGNOSIS — I340 Nonrheumatic mitral (valve) insufficiency: Secondary | ICD-10-CM | POA: Diagnosis not present

## 2019-10-07 DIAGNOSIS — D631 Anemia in chronic kidney disease: Secondary | ICD-10-CM | POA: Diagnosis not present

## 2019-10-07 DIAGNOSIS — R5381 Other malaise: Secondary | ICD-10-CM | POA: Diagnosis not present

## 2019-10-07 DIAGNOSIS — I482 Chronic atrial fibrillation, unspecified: Secondary | ICD-10-CM | POA: Diagnosis not present

## 2019-10-07 DIAGNOSIS — J181 Lobar pneumonia, unspecified organism: Secondary | ICD-10-CM | POA: Diagnosis not present

## 2019-10-07 DIAGNOSIS — I340 Nonrheumatic mitral (valve) insufficiency: Secondary | ICD-10-CM | POA: Diagnosis not present

## 2019-10-07 DIAGNOSIS — I959 Hypotension, unspecified: Secondary | ICD-10-CM | POA: Diagnosis not present

## 2019-10-07 DIAGNOSIS — I130 Hypertensive heart and chronic kidney disease with heart failure and stage 1 through stage 4 chronic kidney disease, or unspecified chronic kidney disease: Secondary | ICD-10-CM | POA: Diagnosis not present

## 2019-10-07 DIAGNOSIS — I5043 Acute on chronic combined systolic (congestive) and diastolic (congestive) heart failure: Secondary | ICD-10-CM | POA: Diagnosis not present

## 2019-10-07 DIAGNOSIS — N183 Chronic kidney disease, stage 3 unspecified: Secondary | ICD-10-CM | POA: Diagnosis not present

## 2019-10-07 DIAGNOSIS — I4892 Unspecified atrial flutter: Secondary | ICD-10-CM | POA: Diagnosis not present

## 2019-10-11 DIAGNOSIS — I340 Nonrheumatic mitral (valve) insufficiency: Secondary | ICD-10-CM | POA: Diagnosis not present

## 2019-10-11 DIAGNOSIS — I4892 Unspecified atrial flutter: Secondary | ICD-10-CM | POA: Diagnosis not present

## 2019-10-11 DIAGNOSIS — R5381 Other malaise: Secondary | ICD-10-CM | POA: Diagnosis not present

## 2019-10-11 DIAGNOSIS — I130 Hypertensive heart and chronic kidney disease with heart failure and stage 1 through stage 4 chronic kidney disease, or unspecified chronic kidney disease: Secondary | ICD-10-CM | POA: Diagnosis not present

## 2019-10-11 DIAGNOSIS — D631 Anemia in chronic kidney disease: Secondary | ICD-10-CM | POA: Diagnosis not present

## 2019-10-11 DIAGNOSIS — I959 Hypotension, unspecified: Secondary | ICD-10-CM | POA: Diagnosis not present

## 2019-10-11 DIAGNOSIS — J181 Lobar pneumonia, unspecified organism: Secondary | ICD-10-CM | POA: Diagnosis not present

## 2019-10-11 DIAGNOSIS — I482 Chronic atrial fibrillation, unspecified: Secondary | ICD-10-CM | POA: Diagnosis not present

## 2019-10-11 DIAGNOSIS — N183 Chronic kidney disease, stage 3 unspecified: Secondary | ICD-10-CM | POA: Diagnosis not present

## 2019-10-11 DIAGNOSIS — I5043 Acute on chronic combined systolic (congestive) and diastolic (congestive) heart failure: Secondary | ICD-10-CM | POA: Diagnosis not present

## 2019-10-13 DIAGNOSIS — J181 Lobar pneumonia, unspecified organism: Secondary | ICD-10-CM | POA: Diagnosis not present

## 2019-10-13 DIAGNOSIS — R5381 Other malaise: Secondary | ICD-10-CM | POA: Diagnosis not present

## 2019-10-13 DIAGNOSIS — I482 Chronic atrial fibrillation, unspecified: Secondary | ICD-10-CM | POA: Diagnosis not present

## 2019-10-13 DIAGNOSIS — I959 Hypotension, unspecified: Secondary | ICD-10-CM | POA: Diagnosis not present

## 2019-10-13 DIAGNOSIS — I5043 Acute on chronic combined systolic (congestive) and diastolic (congestive) heart failure: Secondary | ICD-10-CM | POA: Diagnosis not present

## 2019-10-13 DIAGNOSIS — I130 Hypertensive heart and chronic kidney disease with heart failure and stage 1 through stage 4 chronic kidney disease, or unspecified chronic kidney disease: Secondary | ICD-10-CM | POA: Diagnosis not present

## 2019-10-13 DIAGNOSIS — N183 Chronic kidney disease, stage 3 unspecified: Secondary | ICD-10-CM | POA: Diagnosis not present

## 2019-10-13 DIAGNOSIS — I4892 Unspecified atrial flutter: Secondary | ICD-10-CM | POA: Diagnosis not present

## 2019-10-13 DIAGNOSIS — I340 Nonrheumatic mitral (valve) insufficiency: Secondary | ICD-10-CM | POA: Diagnosis not present

## 2019-10-13 DIAGNOSIS — D631 Anemia in chronic kidney disease: Secondary | ICD-10-CM | POA: Diagnosis not present

## 2019-10-18 ENCOUNTER — Ambulatory Visit: Payer: Medicare HMO | Admitting: Internal Medicine

## 2019-10-18 NOTE — Progress Notes (Addendum)
Pt not tested for covid prior to procedure due to pt testing + for covid on 09/14/19. Based on the guidelines the pt is in the 90 day window to not retest. The pt is still expected to quarantine until their procedure. Therefore, the pt can still have the scheduled procedure. These are the guidelines as follows:  Guidance: Patient previously tested + COVID; now past 90 day window seeking elective surgery (asymptomatic)  Retest patient If negative, proceed with surgery If positive, postpone surgery for 10 days from positive test Patient to quarantine for the (10 days) Do not retest again prior to surgery (even if scheduled a couple of weeks out) Use standard precautions for surgery

## 2019-10-19 ENCOUNTER — Inpatient Hospital Stay (HOSPITAL_COMMUNITY)
Admission: RE | Admit: 2019-10-19 | Discharge: 2019-10-19 | Disposition: A | Discharge status: Home / Self Care | Payer: Medicare HMO | Source: Ambulatory Visit

## 2019-10-20 NOTE — Progress Notes (Signed)
Notified son of procedure time change.  Patient will now arrive @ 0830, for a 10:00 procedure.  Nothing to eat or drink after midnight,  She will take  Eliquis doses today. NO medications in the morning.  Her son will driver her home tomorrow and arrange someone to stay with her overnight.

## 2019-10-21 ENCOUNTER — Ambulatory Visit (HOSPITAL_COMMUNITY)
Admission: RE | Disposition: A | Discharge status: Home / Self Care | Payer: Self-pay | Source: Home / Self Care | Attending: Internal Medicine

## 2019-10-21 ENCOUNTER — Observation Stay (HOSPITAL_COMMUNITY)
Admission: RE | Admit: 2019-10-21 | Discharge: 2019-10-22 | Disposition: A | Discharge status: Home / Self Care | Payer: Medicare HMO | Attending: Internal Medicine | Admitting: Internal Medicine

## 2019-10-21 ENCOUNTER — Encounter (HOSPITAL_COMMUNITY): Payer: Self-pay | Admitting: Internal Medicine

## 2019-10-21 DIAGNOSIS — E039 Hypothyroidism, unspecified: Secondary | ICD-10-CM | POA: Diagnosis not present

## 2019-10-21 DIAGNOSIS — I959 Hypotension, unspecified: Secondary | ICD-10-CM | POA: Insufficient documentation

## 2019-10-21 DIAGNOSIS — D631 Anemia in chronic kidney disease: Secondary | ICD-10-CM | POA: Diagnosis not present

## 2019-10-21 DIAGNOSIS — I5043 Acute on chronic combined systolic (congestive) and diastolic (congestive) heart failure: Secondary | ICD-10-CM | POA: Diagnosis not present

## 2019-10-21 DIAGNOSIS — E785 Hyperlipidemia, unspecified: Secondary | ICD-10-CM | POA: Diagnosis not present

## 2019-10-21 DIAGNOSIS — Z7982 Long term (current) use of aspirin: Secondary | ICD-10-CM | POA: Diagnosis not present

## 2019-10-21 DIAGNOSIS — Z950 Presence of cardiac pacemaker: Secondary | ICD-10-CM | POA: Insufficient documentation

## 2019-10-21 DIAGNOSIS — R262 Difficulty in walking, not elsewhere classified: Secondary | ICD-10-CM | POA: Insufficient documentation

## 2019-10-21 DIAGNOSIS — Z79899 Other long term (current) drug therapy: Secondary | ICD-10-CM | POA: Insufficient documentation

## 2019-10-21 DIAGNOSIS — I495 Sick sinus syndrome: Secondary | ICD-10-CM | POA: Diagnosis not present

## 2019-10-21 DIAGNOSIS — U071 COVID-19: Secondary | ICD-10-CM | POA: Diagnosis not present

## 2019-10-21 DIAGNOSIS — N183 Chronic kidney disease, stage 3 unspecified: Secondary | ICD-10-CM | POA: Diagnosis not present

## 2019-10-21 DIAGNOSIS — I340 Nonrheumatic mitral (valve) insufficiency: Secondary | ICD-10-CM | POA: Diagnosis not present

## 2019-10-21 DIAGNOSIS — Z7901 Long term (current) use of anticoagulants: Secondary | ICD-10-CM | POA: Diagnosis not present

## 2019-10-21 DIAGNOSIS — I4891 Unspecified atrial fibrillation: Principal | ICD-10-CM | POA: Insufficient documentation

## 2019-10-21 DIAGNOSIS — R0602 Shortness of breath: Secondary | ICD-10-CM | POA: Diagnosis present

## 2019-10-21 DIAGNOSIS — R5381 Other malaise: Secondary | ICD-10-CM | POA: Diagnosis not present

## 2019-10-21 DIAGNOSIS — I482 Chronic atrial fibrillation, unspecified: Secondary | ICD-10-CM | POA: Diagnosis not present

## 2019-10-21 DIAGNOSIS — I4821 Permanent atrial fibrillation: Secondary | ICD-10-CM | POA: Diagnosis not present

## 2019-10-21 DIAGNOSIS — J181 Lobar pneumonia, unspecified organism: Secondary | ICD-10-CM | POA: Diagnosis not present

## 2019-10-21 DIAGNOSIS — I4892 Unspecified atrial flutter: Secondary | ICD-10-CM | POA: Diagnosis not present

## 2019-10-21 HISTORY — PX: AV NODE ABLATION: EP1193

## 2019-10-21 SURGERY — AV NODE ABLATION

## 2019-10-21 MED ORDER — ASPIRIN EC 81 MG PO TBEC
81.0000 mg | DELAYED_RELEASE_TABLET | Freq: Every day | ORAL | Status: DC
  Administered 2019-10-22: 81 mg via ORAL
  Filled 2019-10-21: qty 1

## 2019-10-21 MED ORDER — LEVOTHYROXINE SODIUM 88 MCG PO TABS
88.0000 ug | ORAL_TABLET | Freq: Every day | ORAL | Status: DC
  Administered 2019-10-22: 88 ug via ORAL
  Filled 2019-10-21: qty 1

## 2019-10-21 MED ORDER — SODIUM CHLORIDE 0.9% FLUSH: 3.0000 mL | INTRAVENOUS | Status: DC | PRN

## 2019-10-21 MED ORDER — MUSCLE RUB 10-15 % EX CREA: 1.0000 "application " | TOPICAL_CREAM | CUTANEOUS | 0 refills | Status: AC | PRN

## 2019-10-21 MED ORDER — FUROSEMIDE 20 MG PO TABS
20.0000 mg | ORAL_TABLET | Freq: Two times a day (BID) | ORAL | Status: DC
  Administered 2019-10-21 – 2019-10-22 (×2): 20 mg via ORAL
  Filled 2019-10-21 (×2): qty 1

## 2019-10-21 MED ORDER — ONDANSETRON HCL 4 MG/2ML IJ SOLN: 4.0000 mg | Freq: Four times a day (QID) | INTRAMUSCULAR | Status: DC | PRN

## 2019-10-21 MED ORDER — ACETAMINOPHEN 325 MG PO TABS: 650.0000 mg | ORAL_TABLET | ORAL | Status: DC | PRN

## 2019-10-21 MED ORDER — MIDAZOLAM HCL 5 MG/5ML IJ SOLN
INTRAMUSCULAR | Status: DC | PRN
  Administered 2019-10-21 (×2): 0.5 mg via INTRAVENOUS
  Administered 2019-10-21: 1 mg via INTRAVENOUS

## 2019-10-21 MED ORDER — HYDROCODONE-ACETAMINOPHEN 5-325 MG PO TABS: 1.0000 | ORAL_TABLET | ORAL | Status: DC | PRN

## 2019-10-21 MED ORDER — HEPARIN (PORCINE) IN NACL 1000-0.9 UT/500ML-% IV SOLN
INTRAVENOUS | Status: AC
  Filled 2019-10-21: qty 500

## 2019-10-21 MED ORDER — BUPIVACAINE HCL (PF) 0.25 % IJ SOLN
INTRAMUSCULAR | Status: DC | PRN
  Administered 2019-10-21: 30 mL

## 2019-10-21 MED ORDER — APIXABAN 2.5 MG PO TABS
2.5000 mg | ORAL_TABLET | Freq: Two times a day (BID) | ORAL | Status: DC
  Administered 2019-10-22: 2.5 mg via ORAL
  Filled 2019-10-21: qty 1

## 2019-10-21 MED ORDER — HEPARIN (PORCINE) IN NACL 1000-0.9 UT/500ML-% IV SOLN: INTRAVENOUS | Status: DC | PRN

## 2019-10-21 MED ORDER — BUPIVACAINE HCL (PF) 0.25 % IJ SOLN
INTRAMUSCULAR | Status: AC
  Filled 2019-10-21: qty 30

## 2019-10-21 MED ORDER — SODIUM CHLORIDE 0.9 % IV SOLN: 250.0000 mL | INTRAVENOUS | Status: DC | PRN

## 2019-10-21 MED ORDER — POTASSIUM CHLORIDE CRYS ER 10 MEQ PO TBCR
10.0000 meq | EXTENDED_RELEASE_TABLET | Freq: Every day | ORAL | Status: DC
  Administered 2019-10-21 – 2019-10-22 (×2): 10 meq via ORAL
  Filled 2019-10-21 (×2): qty 1

## 2019-10-21 MED ORDER — MUSCLE RUB 10-15 % EX CREA
1.0000 "application " | TOPICAL_CREAM | CUTANEOUS | Status: DC | PRN
  Administered 2019-10-21: 1 via TOPICAL
  Filled 2019-10-21: qty 85

## 2019-10-21 MED ORDER — METOPROLOL TARTRATE 25 MG PO TABS: 12.5000 mg | ORAL_TABLET | Freq: Two times a day (BID) | ORAL | Status: DC

## 2019-10-21 MED ORDER — SODIUM CHLORIDE 0.9% FLUSH: 3.0000 mL | Freq: Two times a day (BID) | INTRAVENOUS | Status: DC

## 2019-10-21 MED ORDER — SODIUM CHLORIDE 0.9 % IV SOLN: INTRAVENOUS | Status: DC

## 2019-10-21 MED ORDER — MIDAZOLAM HCL 5 MG/5ML IJ SOLN
INTRAMUSCULAR | Status: AC
  Filled 2019-10-21: qty 5

## 2019-10-21 SURGICAL SUPPLY — 7 items
CATH CELSIUS THERMO F CV 7FR (ABLATOR) ×2 IMPLANT
CLOSURE PERCLOSE PROSTYLE (VASCULAR PRODUCTS) ×2 IMPLANT
PACK EP LATEX FREE (CUSTOM PROCEDURE TRAY) ×2
PACK EP LF (CUSTOM PROCEDURE TRAY) ×1 IMPLANT
PAD PRO RADIOLUCENT 2001M-C (PAD) ×2 IMPLANT
SHEATH PINNACLE 8F 10CM (SHEATH) ×4 IMPLANT
SHEATH PROBE COVER 6X72 (BAG) ×2 IMPLANT

## 2019-10-21 NOTE — Discharge Summary (Addendum)
ELECTROPHYSIOLOGY PROCEDURE DISCHARGE SUMMARY    Patient ID: Nancy Meadows,  MRN: [id number], DOB/AGE: 84-Sep-1934 84 y.o.  Admit date: 10/21/2019 Discharge date: 10/22/2019  Primary Care Physician: Rowe Pavy, MD  Primary Cardiologist: Thompson Grayer, MD  Electrophysiologist: Dr. Rayann Heman  Primary Discharge Diagnosis:  Permanent atrial fibrillation with poorly controlled rates  Secondary Discharge Diagnosis:  SSS Tachybradycardia MR s/p Mitraclip at [facility] Medtronic dual chamber PPM   Procedures This Admission:  1.  AV nodal ablation on 10/21/2019 by Dr. Rayann Heman.   This study demonstrated; i. AFib with RVR upon presentation.  ii. Successful AV nodal ablation iii. No early apparent complications.    Brief HPI: Nancy Meadows is a 86 y.o. female with a history of poorly controlled atrial fibrillation and tachybradycardia syndrome s/p previous placement of MDT DDD pacemaker. She has continued to fail medical rate control therapy. .  . Risks, benefits, and alternatives to catheter ablation of AV node were reviewed with the patient who wished to proceed.   Hospital Course:  The patient was admitted and underwent RFCA of AV node with details as outlined above.  They were monitored on telemetry overnight which demonstrated V pacing in the 80s.  Groin was without complication on the day of discharge.  The patient was examined and considered to be stable for discharge.  Wound care and restrictions were reviewed with the patient.  The patient will be seen back by EP APP in 2 weeks and Dr. Rayann Heman in 12 weeks for post ablation follow up.   Pt had intermittent SOB that she thought was at least partially associated with anxiety. Pt tolerated up to chair without difficulty. Walked halls with PT and recommended for HHPT. Discussed personally with son and they feel comfortable supporting her at home.   This patients CHA2DS2-VASc Score and unadjusted Ischemic Stroke Rate (% per year)  is equal to 7.2 % stroke rate/year from a score of 5 Above score calculated as 1 point each if present [CHF, HTN, DM, Vascular=MI/PAD/Aortic Plaque, Age if 65-74, or Female] Above score calculated as 2 points each if present [Age > 75, or Stroke/TIA/TE]   Physical Exam: Vitals:   10/22/19 0800 10/22/19 0900 10/22/19 1000 10/22/19 1135  BP:  98/72 113/83 100/68  Pulse:  (!) 103 80 80  Resp: (!) 26 (!) 22 17 (!) 22  Temp:  98.2 F (36.8 C) 98.4 F (36.9 C) 98.7 F (37.1 C)  TempSrc:  Oral Oral Oral  SpO2:  96% 98% 98%  Weight:      Height:        GEN- The patient is well appearing, alert and oriented x 3 today.   HEENT: normocephalic, atraumatic; sclera clear, conjunctiva pink; hearing intact; oropharynx clear; neck supple  Lungs- Clear to ausculation bilaterally, normal work of breathing.  No wheezes, rales, rhonchi Heart- Regular rate and rhythm, no murmurs, rubs or gallops  GI- soft, non-tender, non-distended, bowel sounds present  Extremities- no clubbing, cyanosis, or edema; DP/PT/radial pulses 2+ bilaterally, groin without hematoma/bruit MS- no significant deformity or atrophy Skin- warm and dry, no rash or lesion Psych- euthymic mood, full affect Neuro- strength and sensation are intact  Labs:   Lab Results  Component Value Date   WBC 5.1 10/01/2019   HGB 9.6 (L) 10/01/2019   HCT 30.8 (L) 10/01/2019   MCV 90 10/01/2019   PLT 116 (L) 10/01/2019   No results for input(s): NA, K, CL, CO2, BUN, CREATININE, CALCIUM, PROT, BILITOT, ALKPHOS, ALT,  AST, GLUCOSE in the last 168 hours.  Invalid input(s): LABALBU   Discharge Medications:  Allergies as of 10/22/2019      Reactions   Meclizine Other (See Comments)   Increased dizziness and confusion      Medication List    STOP taking these medications   metoprolol tartrate 25 MG tablet Commonly known as: LOPRESSOR     TAKE these medications   apixaban 2.5 MG Tabs tablet Commonly known as: Eliquis Take 1 tablet  (2.5 mg total) by mouth 2 (two) times daily.   aspirin EC 81 MG tablet Take 81 mg by mouth daily. Swallow whole.   furosemide 20 MG tablet Commonly known as: LASIX Take 20 mg by mouth 2 (two) times daily.   levothyroxine 88 MCG tablet Commonly known as: SYNTHROID Take 88 mcg by mouth daily before breakfast.   Muscle Rub 10-15 % Crea Apply 1 application topically as needed for muscle pain.   potassium chloride 10 MEQ tablet Commonly known as: KLOR-CON Take 10 mEq by mouth daily.       Disposition:    Follow-up Information    Thompson Grayer, MD Follow up on 01/26/2020.   Specialty: Cardiology Why: at 3 pm for 3 month post AVN ablation follow up Contact information: [address] [place] [address] [phone]        Baldwin Jamaica, PA-C Follow up on 11/04/2019.   Specialty: Cardiology Why: at 230 pm for post AVN ablation follow up Contact information: [address] [place] [address] [phone]               Duration of Discharge Encounter: Greater than 30 minutes including physician time.  Jacalyn Lefevre, PA-C  10/22/2019 12:47 PM

## 2019-10-21 NOTE — H&P (Signed)
Nancy Meadows is a 86 y.o. female who presents today for AV nodal ablation.  Since last being seen in our clinic, the patient reports doing reasonably well.  She is s/p mitra clip at [facility] 07/21/19.  She has had some issues wth hypotension related to her rate controlling medicines.  She has heart rates 70s-120s. She has permanent afib and is on eliquis for stroke prevention.   She has chronic SOB and edema.  she recently tested positive for COVID but has done well clinically.  Today, she denies symptoms of palpitations, chest pain,  dizziness, presyncope, or syncope.  The patient is otherwise without complaint today.       Past Medical History:  Diagnosis Date  . Acute head trauma 02/09/2012  . Acute respiratory failure ([place]) 10/22/2017  . Anemia 10/22/2017  . Arthritis    ddd-- occ. pain  . Cholelithiasis   . CKD (chronic kidney disease) stage 3, GFR 30-59 ml/min 03/27/2018  . DIZZINESS 03/07/2010   Qualifier: Diagnosis of  By: Burnett Kanaris    . Dyspnea 03/27/2018  . Eczema   . Goiter   . HLD (hyperlipidemia)   . Hx of cardiovascular stress test    a. ETT-MV 7/14:  Low risk, ant defect c/w soft tissue atten., no ischemia, EF 77%  . HYPERLIPIDEMIA 03/08/2010   Qualifier: Diagnosis of  By: Stanford Breed, MD, Kandyce Rud   . Hypothyroidism 03/08/2010   Qualifier: Diagnosis of  By: Stanford Breed, MD, Kandyce Rud   . Hypoxia   . Lesion of bladder    hematuria/ frequency  . Palpitations 08/17/2012  . Paroxysmal atrial fibrillation ([place]) 01/2016   cahds2vasc sore is 3.  consider anticoagulation if afib burden increases  . Pleural effusion, bilateral 10/22/2017  . SAH (subarachnoid hemorrhage) ([place]) 02/09/2012  . SDH (subdural hematoma) (HCC)    in the setting of syncope and fall prior to PPM implant, now resolved  . Sick sinus syndrome ([place])   . Subdural hematoma, acute ([place]) 02/09/2012  . Syncope    pauses of >4 seconds        Past Surgical History:   Procedure Laterality Date  . ABDOMINAL HYSTERECTOMY  age 86  . APPENDECTOMY  age 38  . bilateral salpingoophectomy  age 69  . CATARACT EXTRACTION W/ INTRAOCULAR LENS  IMPLANT, BILATERAL  2012  . CYSTOSCOPY WITH BIOPSY  11/21/2010   Procedure: CYSTOSCOPY WITH BIOPSY;  Surgeon: Molli Hazard, MD;  Location[facility];  Service: Urology;  Laterality: N/A;  CYSTOSCOPY WITH BLADDER BIOPSY  AND INSTILLATION OF INDIGO CARMINE  . CYSTOSCOPY/RETROGRADE/URETEROSCOPY  11/21/2010   Procedure: CYSTOSCOPY/RETROGRADE/URETEROSCOPY;  Surgeon: Molli Hazard, MD;  Location: [facility];  Service: Urology;  Laterality: Left;  . gallstones removed  age 37  . LOOP RECORDER IMPLANT N/A 12/31/2012   Procedure: LOOP RECORDER IMPLANT;  Surgeon: Coralyn Mark, MD;  Location: [facility]ATH [facility];  Service: Cardiovascular;  Laterality: N/A;  . PACEMAKER INSERTION  05-20-2013   MDT ADDRL1 pacemaker implanted by Dr Rayann Heman for SSS and symptomatic bradycardia  . PERMANENT PACEMAKER INSERTION Left 05/20/2013   Procedure: PERMANENT PACEMAKER INSERTION;  Surgeon: Coralyn Mark, MD;  Location: [facility] C[facility];  Service: Cardiovascular;  Laterality: Left;    ROS- all systems are reviewed and negative except as per HPI above        Current Outpatient Medications  Medication Sig Dispense Refill  . apixaban (ELIQUIS) 2.5 MG TABS tablet Take 1 tablet (2.5 mg  total) by mouth 2 (two) times daily. 60 tablet 5  . aspirin EC 81 MG tablet Take 81 mg by mouth daily. Swallow whole.    . furosemide (LASIX) 20 MG tablet Take 20 mg by mouth 2 (two) times daily.    Marland Kitchen levothyroxine (SYNTHROID, LEVOTHROID) 88 MCG tablet Take 88 mcg by mouth every morning.     . metoprolol tartrate (LOPRESSOR) 25 MG tablet Take 12.5 mg by mouth 2 (two) times daily.    . potassium chloride (KLOR-CON) 10 MEQ tablet Take 10 mEq by mouth daily.     No current facility-administered medications for this  visit.   Physical Exam: Vitals:   10/21/19 0830  BP: 103/80  Pulse: (!) 52  Resp: 17  Temp: 98.2 F (36.8 C)  SpO2: 99%  Weight: 54.4 kg  Height: 5' 7.5" (1.715 m)    GEN- The patient is elderly appearing, alert   Head- normocephalic, atraumatic Eyes-  Sclera clear, conjunctiva pink Ears- hearing intact Oropharynx- clear Neck- supple,  Lungs-  normal work of breathing Chest- pacemaker pocket is without hematoma/ bruit Heart- irregular rate and rhythm  GI- soft  Extremities- no clubbing, cyanosis, or edema   Assessment and Plan:  1. Permanent afib with frequent RVR, hypotension and EF 45-50% (likely tachycardia mediated) V rates by device interrogation are elevated Today, we discussed AV nodal ablation at length previously and again today.   chads2vasc score of 4.  She is on eliquis chronically. Risk, benefits, and alternatives to AV nodal ablation were again discussed in detail today. These risks include but are not limited to stroke, bleeding, vascular damage, tamponade, perforation, damage to the heart and other structures,  worsening renal function, pacemaker lead dislodgement and death.   The patient understands these risk.  She and her family (previous discussion) are also aware that she will likely be device dependant and wish to proceed.   Thompson Grayer MD, [facility] 10/21/2019 11:11 AM

## 2019-10-21 NOTE — Plan of Care (Signed)
  Problem: Education: Goal: Knowledge of General Education information will improve Description: Including pain rating scale, medication(s)/side effects and non-pharmacologic comfort measures Outcome: Progressing   Problem: Clinical Measurements: Goal: Ability to maintain clinical measurements within normal limits will improve Outcome: Progressing Goal: Will remain free from infection Outcome: Progressing Goal: Cardiovascular complication will be avoided Outcome: Progressing   Problem: Nutrition: Goal: Adequate nutrition will be maintained Outcome: Progressing

## 2019-10-22 ENCOUNTER — Encounter (HOSPITAL_COMMUNITY): Payer: Self-pay | Admitting: Internal Medicine

## 2019-10-22 ENCOUNTER — Other Ambulatory Visit: Payer: Self-pay

## 2019-10-22 DIAGNOSIS — I442 Atrioventricular block, complete: Secondary | ICD-10-CM

## 2019-10-22 DIAGNOSIS — I4819 Other persistent atrial fibrillation: Secondary | ICD-10-CM

## 2019-10-22 DIAGNOSIS — I4891 Unspecified atrial fibrillation: Secondary | ICD-10-CM | POA: Diagnosis not present

## 2019-10-22 MED FILL — Heparin Sod (Porcine)-NaCl IV Soln 1000 Unit/500ML-0.9%: INTRAVENOUS | Qty: 500 | Status: AC

## 2019-10-22 NOTE — Progress Notes (Signed)
   10/22/19 0700  Assess: MEWS Score  Temp (!) 97 F (36.1 C)  BP 98/67  Pulse Rate 81  ECG Heart Rate 82  Resp (!) 34  SpO2 98 %  O2 Device Room Air  Assess: MEWS Score  MEWS Temp 0  MEWS Systolic 1  MEWS Pulse 0  MEWS RR 2  MEWS LOC 0  MEWS Score 3  MEWS Score Color Yellow  Assess: if the MEWS score is Yellow or Red  Were vital signs taken at a resting state? Yes  Focused Assessment Change from prior assessment (see assessment flowsheet)  Early Detection of Sepsis Score *See Row Information* Low  MEWS guidelines implemented *See Row Information* Yes  Treat  MEWS Interventions Administered scheduled meds/treatments  Pain Scale 0-10  Pain Score 0  Take Vital Signs  Increase Vital Sign Frequency  Yellow: Q 2hr X 2 then Q 4hr X 2, if remains yellow, continue Q 4hrs  Escalate  MEWS: Escalate Yellow: discuss with charge nurse/RN and consider discussing with provider and RRT  Notify: Charge Nurse/RN  Name of Charge Nurse/RN Notified Chrissy, RN   Date Charge Nurse/RN Notified 10/22/19  Time Charge Nurse/RN Notified [phone]  Notify: Provider  Provider Name/Title Tillery, PA  Date Provider Notified 10/22/19  Time Provider Notified [phone]  Notification Type Call  Notification Reason Other (Comment)  Response No new orders  Date of Provider Response 10/22/19  Time of Provider Response [phone]  Document  Patient Outcome Other (Comment) (Remains stable. )  Progress note created (see row info) Yes

## 2019-10-22 NOTE — Progress Notes (Signed)
   Progress Note   Subjective   Doing well today, the patient denies CP or SOB.  No new concerns  Inpatient Medications    Scheduled Meds: . apixaban  2.5 mg Oral BID  . aspirin EC  81 mg Oral Daily  . furosemide  20 mg Oral BID  . levothyroxine  88 mcg Oral QAC breakfast  . potassium chloride  10 mEq Oral Daily   Continuous Infusions: . sodium chloride     PRN Meds: sodium chloride, acetaminophen, HYDROcodone-acetaminophen, Muscle Rub, ondansetron (ZOFRAN) IV, sodium chloride flush   Vital Signs    Vitals:   10/21/19 2300 10/22/19 0203 10/22/19 0700 10/22/19 0800  BP: 110/85 111/82 98/67   Pulse: 80 75 81   Resp: 18 (!) 22 (!) 34 (!) 26  Temp: 97.6 F (36.4 C) (!) 97.3 F (36.3 C) (!) 97 F (36.1 C)   TempSrc: Oral Oral Axillary   SpO2: 99% 100% 98%   Weight:  55.9 kg    Height:        Intake/Output Summary (Last 24 hours) at 10/22/2019 0847 Last data filed at 10/22/2019 0545 Gross per 24 hour  Intake 240 ml  Output --  Net 240 ml   Filed Weights   10/21/19 0830 10/22/19 0203  Weight: 54.4 kg 55.9 kg    Telemetry    Afib, V paced, occasional PVCs - Personally Reviewed  Physical Exam   GEN- The patient is well appearing, alert and oriented x 3 today.   Head- normocephalic, atraumatic Eyes-  Sclera clear, conjunctiva pink Ears- hearing intact Oropharynx- clear Neck- supple, Lungs-  normal work of breathing Heart- Regular rate and rhythm  GI- soft  Extremities- no clubbing, cyanosis, or edema     Labs    ChemistryNo results for input(s): NA, K, CL, CO2, GLUCOSE, BUN, CREATININE, CALCIUM, PROT, ALBUMIN, AST, ALT, ALKPHOS, BILITOT, GFRNONAA, GFRAA, ANIONGAP in the last 168 hours.   HematologyNo results for input(s): WBC, RBC, HGB, HCT, MCV, MCH, MCHC, RDW, PLT in the last 168 hours.     Assessment & Plan    1.  S/p AV nodal ablation Doing well V rates are controlled Return to office in 2 weeks to reprogram to VVIR 70 bpm  2. Persistent  afib Resume anticoagulation  Dc to home today  Thompson Grayer MD, [facility] 10/22/2019 8:47 AM

## 2019-10-22 NOTE — Progress Notes (Signed)
   Paged for intermittent SOB. Pt at times without complaint but at others with tachypnea up to 30-35 breaths per minute.   She states she overall feels OK, just more SOB than normal.  She feels like she is anxious.   Received po lasix this am. Not volume overloaded on exam. Breath sounds normal and no increased WOB on exam.   Pt OOB to chair without issues but SOB with ambulation. Lives alone so will have PT with anticipated discharge for further recommendations.   Legrand Como [address] [place], PA-C  10/22/2019 10:26 AM

## 2019-10-22 NOTE — Progress Notes (Signed)
Patient assisted OOB to chair, tolerated without problems.  Attempted to ambulate patient in hall with walker, patient ambulated 10 feet and c/o feeling tired and SOB, asked to sit down.  Oxygen saturation 92% on room air.  Patient refused to ambulate further at this time.  States she is too weak.  PA Tillery notified and advised that patient states she lives alone.  Order for PT/OT to evaluate patient.

## 2019-10-22 NOTE — TOC Initial Note (Signed)
Transition of Care [facility]) - Initial/Assessment Note    Patient Details  Name: Nancy Meadows MRN: [id number] Date of Birth: [birth year]  [facility]) CM/SW Contact:    Bethena Roys, RN Phone Number: 10/22/2019, 12:52 PM  Clinical Narrative:  Case Manager spoke with patient regarding home health needs. Patient was currently active with [facility] services in [place]. Patient could not remember the name of the agency. Case Manager received verbal permission to call the son Cayleen Benjamin and he stated patient is active with [facility]. Case Manager then called intake and verified services for Registered Nurse, Physical Therapy and Aide. Case Manager was able to get services switched from [place] to [place] and the patient will be seen on Monday. Case Manager trying to get in contact with hospital liaison for assistance with orders. Orders for home health will be placed in Epic. Son will provide transportation home via private vehicle. No further needs from Case Manager at this time.    Expected Discharge Plan: [place] Barriers to Discharge: No Barriers Identified   Patient Goals and CMS Choice Patient states their goals for this hospitalization and ongoing recovery are:: to return home   Choice offered to / list presented to : NA (Pt currently active with [facility] notified and will service the patient in [place].)  Expected Discharge Plan and Services Expected Discharge Plan: [place] In-house Referral: NA Discharge Planning Services: CM Consult Post Acute Care Choice: Resumption of Svcs/PTA Provider Living arrangements for the past 2 months: Apartment (Senior living apartment.) Expected Discharge Date: 10/22/19               DME Arranged: N/A DME Agency: NA       HH Arranged: NA [place] Agency: NA        Prior Living Arrangements/Services Living  arrangements for the past 2 months: Apartment Web designer living apartment.) Lives with:: Self (son checks in on patient.) Patient language and need for interpreter reviewed:: Yes        Need for Family Participation in Patient Care: Yes (Comment) Care giver support system in place?: Yes (comment)   Criminal Activity/Legal Involvement Pertinent to Current Situation/Hospitalization: No - Comment as needed  Activities of Daily Living Home Assistive Devices/Equipment: None ADL Screening (condition at time of admission) Patient's cognitive ability adequate to safely complete daily activities?: Yes Is the patient deaf or have difficulty hearing?: No Does the patient have difficulty seeing, even when wearing glasses/contacts?: No Does the patient have difficulty concentrating, remembering, or making decisions?: No Patient able to express need for assistance with ADLs?: Yes Does the patient have difficulty dressing or bathing?: Yes Independently performs ADLs?: No Communication: Independent Dressing (OT): Independent Feeding: Independent Bathing: Independent Toileting: Independent In/Out Bed: Needs assistance Is this a change from baseline?: Pre-admission baseline Walks in Home: Needs assistance Is this a change from baseline?: Pre-admission baseline Does the patient have difficulty walking or climbing stairs?: Yes Weakness of Legs: Both Weakness of Arms/Hands: None  Permission Sought/Granted Permission sought to share information with : Family Supports, Customer service manager, Case Optician, dispensing granted to share information with : Yes, Verbal Permission Granted  Share Information with NAME: Son Marine scientist granted to share info w AGENCY: [facility]        Emotional Assessment Appearance:: Appears stated age Attitude/Demeanor/Rapport: Engaged Affect (typically observed): Appropriate Orientation: : Oriented to  Self, Oriented to Place, Oriented  to  Time, Oriented to Situation Alcohol / Substance Use: Not Applicable Psych Involvement: No (comment)  Admission diagnosis:  Atrial fibrillation [facility]) [I48.91] Patient Active Problem List   Diagnosis Date Noted  . Atrial fibrillation ([place]) 10/21/2019  . Pacemaker 08/24/2019  . Acute on chronic diastolic congestive heart failure ([place]) 08/22/2019  . Equivocal immunity to COVID-19 virus 08/22/2019  . Hyponatremia   . Permanent atrial fibrillation ([place]) 08/04/2019  . Secondary hypercoagulable state ([place]) 08/04/2019  . Acute diastolic CHF (congestive heart failure) ([place]) 06/10/2019  . Acute on chronic diastolic CHF (congestive heart failure) ([place]) 06/10/2019  . Fall   . Severe mitral regurgitation   . Compression fracture of first lumbar vertebra ([place]) 06/08/2019  . Closed compression fracture of L3 lumbar vertebra, initial encounter ([place]) 06/08/2019  . Dyspnea 03/27/2018  . CKD (chronic kidney disease) stage 3, GFR 30-59 ml/min (HCC) 03/27/2018  . AF (paroxysmal atrial fibrillation) ([place]) 03/27/2018  . Hypoxia   . Atrial fibrillation with RVR ([place])   . Acute respiratory failure ([place]) 10/22/2017  . Pleural effusion, bilateral 10/22/2017  . Normocytic anemia 10/22/2017  . Premature atrial contraction 09/05/2014  . Sick sinus syndrome ([place]) 03/07/2013  . Palpitations 08/17/2012  . Syncope 02/09/2012  . Acute head trauma 02/09/2012  . Subdural hematoma, acute ([place]) 02/09/2012  . SAH (subarachnoid hemorrhage) ([place]) 02/09/2012  . Hypothyroidism 03/08/2010  . HLD (hyperlipidemia) 03/08/2010  . CARDIAC MURMUR 03/08/2010  . CHEST PAIN 03/08/2010  . DIZZINESS 03/07/2010   PCP:  Rowe Pavy, MD Pharmacy:   [id number] [place] [address] [place]Phone: [phone]: [phone] [phone][facility] [place] [address]Phone: [phone]: [phone] Readmission Risk Interventions No flowsheet data found.

## 2019-10-22 NOTE — Evaluation (Signed)
Physical Therapy Evaluation Patient Details Name: Nancy Meadows MRN: [id number] DOB: Oct 11, 1932 Today's Date: 10/22/2019   History of Present Illness  Pt is a 86 y.o. F with significant PMH of CKD stage 3, paroxsymal atrial fibrillation, SDH/SAH, pacemaker placement who presents for AV nodal ablation 10/21/2019.  Clinical Impression  Prior to admission, pt lives alone in a senior living apartment, uses a Rollator, and is independent with ADL's. Pt presents with decreased functional mobility secondary to decreased cardiopulmonary endurance, weakness, and balance deficits. Pt ambulating 60 feet with a walker, requiring 3 seated rest breaks, HR 80-84 bpm, SpO2 98-100% on RA, BP 107/76 post mobility. Pt would benefit from HHPT to address deficits listed above and maximize functional mobility.     Follow Up Recommendations Home health PT;Supervision/Assistance - 24 hour    Equipment Recommendations  None recommended by PT    Recommendations for Other Services       Precautions / Restrictions Precautions Precautions: Fall Restrictions Weight Bearing Restrictions: No      Mobility  Bed Mobility               General bed mobility comments: OOB in chair  Transfers Overall transfer level: Needs assistance Equipment used: Rolling walker (2 wheeled) Transfers: Sit to/from Stand Sit to Stand: Min guard;Min assist         General transfer comment: Pt requiring up to min assist for transfers to standing x 3 from recliner. Cues for hand placement  Ambulation/Gait Ambulation/Gait assistance: Min guard Gait Distance (Feet): 60 Feet Assistive device: Rolling walker (2 wheeled) Gait Pattern/deviations: Step-through pattern;Decreased stride length;Trunk flexed;Narrow base of support Gait velocity: decreased Gait velocity interpretation: <1.8 ft/sec, indicate of risk for recurrent falls General Gait Details: Cues for walker proximity, activity pacing, negotiation around obstacles.  Min guard for safety, chair follow utilized  Financial trader Rankin (Stroke Patients Only)       Balance Overall balance assessment: Needs assistance Sitting-balance support: Feet supported Sitting balance-Leahy Scale: Good     Standing balance support: Bilateral upper extremity supported Standing balance-Leahy Scale: Poor Standing balance comment: reliant on external support                             Pertinent Vitals/Pain Pain Assessment: No/denies pain    Home Living Family/patient expects to be discharged to:: Private residence Living Arrangements: Alone Available Help at Discharge: Family;Available PRN/intermittently (sister, nieces, son) Type of Home: Apartment Home Access: Level entry     Home Layout: One level Home Equipment: Walker - 4 wheels;Walker - 2 wheels;Grab bars - toilet;Grab bars - tub/shower;Tub bench;Wheelchair - manual      Prior Function Level of Independence: Needs assistance   Gait / Transfers Assistance Needed: uses Rollator   ADL's / Homemaking Assistance Needed: independent ADL's, cooks breakfast, pt family provides other meals         Hand Dominance   Dominant Hand: Right    Extremity/Trunk Assessment   Upper Extremity Assessment Upper Extremity Assessment: Generalized weakness    Lower Extremity Assessment Lower Extremity Assessment: Generalized weakness    Cervical / Trunk Assessment Cervical / Trunk Assessment: Kyphotic  Communication   Communication: No difficulties  Cognition Arousal/Alertness: Awake/alert Behavior During Therapy: WFL for tasks assessed/performed Overall Cognitive Status: Impaired/Different from baseline Area of Impairment: Safety/judgement;Awareness  Safety/Judgement: Decreased awareness of safety;Decreased awareness of deficits Awareness: Emergent   General Comments: Pt states she will be able to "walk longer  distances with her other walker;" decreased awareness of deficits      General Comments      Exercises     Assessment/Plan    PT Assessment Patient needs continued PT services  PT Problem List Decreased strength;Decreased activity tolerance;Decreased balance;Decreased mobility;Decreased safety awareness;Cardiopulmonary status limiting activity       PT Treatment Interventions DME instruction;Gait training;Functional mobility training;Therapeutic activities;Therapeutic exercise;Balance training;Patient/family education    PT Goals (Current goals can be found in the Care Plan section)  Acute Rehab PT Goals Patient Stated Goal: walk longer distances PT Goal Formulation: With patient/family Time For Goal Achievement: 11/05/19 Potential to Achieve Goals: Good    Frequency Min 3X/week   Barriers to discharge        Co-evaluation               AM-PAC PT "6 Clicks" Mobility  Outcome Measure Help needed turning from your back to your side while in a flat bed without using bedrails?: None Help needed moving from lying on your back to sitting on the side of a flat bed without using bedrails?: A Little Help needed moving to and from a bed to a chair (including a wheelchair)?: A Little Help needed standing up from a chair using your arms (e.g., wheelchair or bedside chair)?: A Little Help needed to walk in hospital room?: A Little Help needed climbing 3-5 steps with a railing? : A Lot 6 Click Score: 18    End of Session Equipment Utilized During Treatment: Gait belt Activity Tolerance: Patient tolerated treatment well Patient left: in chair;with call bell/phone within reach Nurse Communication: Mobility status PT Visit Diagnosis: Unsteadiness on feet (R26.81);Muscle weakness (generalized) (M62.81);Difficulty in walking, not elsewhere classified (R26.2)    Time: 1423-2009 PT Time Calculation (min) (ACUTE ONLY): 22 min   Charges:   PT Evaluation $PT Eval Moderate  Complexity: 1 Mod          Wyona Almas, PT, DPT Acute Rehabilitation Services Pager [phone] Office [phone]   Deno Etienne 10/22/2019, 1:37 PM

## 2019-10-22 NOTE — Progress Notes (Addendum)
[place] respiratory rate elevated.

## 2019-10-22 NOTE — Discharge Instructions (Signed)
f Femoral Site Care This sheet gives you information about how to care for yourself after your procedure. Your health care provider may also give you more specific instructions. If you have problems or questions, contact your health care provider. What can I expect after the procedure? After the procedure, it is common to have:  Bruising that usually fades within 1-2 weeks.  Tenderness at the site. Follow these instructions at home: Wound care  Follow instructions from your health care provider about how to take care of your insertion site. Make sure you: ? Wash your hands with soap and water before you change your bandage (dressing). If soap and water are not available, use hand sanitizer. ? Change your dressing as told by your health care provider. ? Leave stitches (sutures), skin glue, or adhesive strips in place. These skin closures may need to stay in place for 2 weeks or longer. If adhesive strip edges start to loosen and curl up, you may trim the loose edges. Do not remove adhesive strips completely unless your health care provider tells you to do that.  Do not take baths, swim, or use a hot tub until your health care provider approves.  You may shower 24-48 hours after the procedure or as told by your health care provider. ? Gently wash the site with plain soap and water. ? Pat the area dry with a clean towel. ? Do not rub the site. This may cause bleeding.  Do not apply powder or lotion to the site. Keep the site clean and dry.  Check your femoral site every day for signs of infection. Check for: ? Redness, swelling, or pain. ? Fluid or blood. ? Warmth. ? Pus or a bad smell. Activity  For the first 2-3 days after your procedure, or as long as directed: ? Avoid climbing stairs as much as possible. ? Do not squat.  Do not lift anything that is heavier than 10 lb (4.5 kg), or the limit that you are told, until your health care provider says that it is safe.  Rest as  directed. ? Avoid sitting for a long time without moving. Get up to take short walks every 1-2 hours.  Do not drive for 24 hours if you were given a medicine to help you relax (sedative). General instructions  Take over-the-counter and prescription medicines only as told by your health care provider.  Keep all follow-up visits as told by your health care provider. This is important. Contact a health care provider if you have:  A fever or chills.  You have redness, swelling, or pain around your insertion site. Get help right away if:  The catheter insertion area swells very fast.  You pass out.  You suddenly start to sweat or your skin gets clammy.  The catheter insertion area is bleeding, and the bleeding does not stop when you hold steady pressure on the area.  The area near or just beyond the catheter insertion site becomes pale, cool, tingly, or numb. These symptoms may represent a serious problem that is an emergency. Do not wait to see if the symptoms will go away. Get medical help right away. Call your local emergency services (911 in the [place] Do not drive yourself to the hospital. Summary  After the procedure, it is common to have bruising that usually fades within 1-2 weeks.  Check your femoral site every day for signs of infection.  Do not lift anything that is heavier than 10 lb (4.5 kg), or  the limit that you are told, until your health care provider says that it is safe. This information is not intended to replace advice given to you by your health care provider. Make sure you discuss any questions you have with your health care provider. Document Revised: 01/13/2017 Document Reviewed: 01/13/2017 Elsevier Patient Education  [employer]  This sheet gives you information about how to care for yourself after your procedure. Your health care provider may also give you more specific instructions. If you have problems or questions,  contact your health care provider. What can I expect after the procedure? After the procedure, it is common to have:  Bruising around your puncture site.  Tenderness around your puncture site.  Skipped heartbeats.  Tiredness (fatigue).  Follow these instructions at home: Puncture site care   Follow instructions from your health care provider about how to take care of your puncture site. Make sure you: ? If present, leave stitches (sutures), skin glue, or adhesive strips in place. These skin closures may need to stay in place for up to 2 weeks. If adhesive strip edges start to loosen and curl up, you may trim the loose edges. Do not remove adhesive strips completely unless your health care provider tells you to do that.  Check your puncture site every day for signs of infection. Check for: ? Redness, swelling, or pain. ? Fluid or blood. If your puncture site starts to bleed, lie down on your back, apply firm pressure to the area, and contact your health care provider. ? Warmth. ? Pus or a bad smell. Driving  Do not drive for at least 4 days after your procedure or however long your health care provider recommends. (Do not resume driving if you have previously been instructed not to drive for other health reasons.)  Do not drive or use heavy machinery while taking prescription pain medicine. Activity  Avoid activities that take a lot of effort for at least 7 days after your procedure.  Do not lift anything that is heavier than 5 lb (4.5 kg) for one week.   No sexual activity for 1 week.   Return to your normal activities as told by your health care provider. Ask your health care provider what activities are safe for you. General instructions  Take over-the-counter and prescription medicines only as told by your health care provider.  Do not use any products that contain nicotine or tobacco, such as cigarettes and e-cigarettes. If you need help quitting, ask your health care  provider.  You may shower after 24 hours, but Do not take baths, swim, or use a hot tub for 1 week.   Do not drink alcohol for 24 hours after your procedure.  Keep all follow-up visits as told by your health care provider. This is important. Contact a health care provider if:  You have redness, mild swelling, or pain around your puncture site.  You have fluid or blood coming from your puncture site that stops after applying firm pressure to the area.  Your puncture site feels warm to the touch.  You have pus or a bad smell coming from your puncture site.  You have a fever.  You have chest pain or discomfort that spreads to your neck, jaw, or arm.  You are sweating a lot.  You feel nauseous.  You have a fast or irregular heartbeat.  You have shortness of breath.  You are dizzy or light-headed and feel the need to  lie down.  You have pain or numbness in the arm or leg closest to your puncture site. Get help right away if:  Your puncture site suddenly swells.  Your puncture site is bleeding and the bleeding does not stop after applying firm pressure to the area. These symptoms may represent a serious problem that is an emergency. Do not wait to see if the symptoms will go away. Get medical help right away. Call your local emergency services (911 in the [place] Do not drive yourself to the hospital. Summary  After the procedure, it is normal to have bruising and tenderness at the puncture site in your groin, neck, or forearm.  Check your puncture site every day for signs of infection.  Get help right away if your puncture site is bleeding and the bleeding does not stop after applying firm pressure to the area. This is a medical emergency. This information is not intended to replace advice given to you by your health care provider. Make sure you discuss any questions you have with your health care provider.   Information on my medicine - ELIQUIS (apixaban)  Why was  Eliquis prescribed for you? Eliquis was prescribed for you to reduce the risk of a blood clot forming that can cause a stroke if you have a medical condition called atrial fibrillation (a type of irregular heartbeat).  What do You need to know about Eliquis ? Take your Eliquis TWICE DAILY - one tablet in the morning and one tablet in the evening with or without food. If you have difficulty swallowing the tablet whole please discuss with your pharmacist how to take the medication safely.  Take Eliquis exactly as prescribed by your doctor and DO NOT stop taking Eliquis without talking to the doctor who prescribed the medication.  Stopping may increase your risk of developing a stroke.  Refill your prescription before you run out.  After discharge, you should have regular check-up appointments with your healthcare provider that is prescribing your Eliquis.  In the future your dose may need to be changed if your kidney function or weight changes by a significant amount or as you get older.  What do you do if you miss a dose? If you miss a dose, take it as soon as you remember on the same day and resume taking twice daily.  Do not take more than one dose of ELIQUIS at the same time to make up a missed dose.  Important Safety Information A possible side effect of Eliquis is bleeding. You should call your healthcare provider right away if you experience any of the following: ? Bleeding from an injury or your nose that does not stop. ? Unusual colored urine (red or dark brown) or unusual colored stools (red or black). ? Unusual bruising for unknown reasons. ? A serious fall or if you hit your head (even if there is no bleeding).  Some medicines may interact with Eliquis and might increase your risk of bleeding or clotting while on Eliquis. To help avoid this, consult your healthcare provider or pharmacist prior to using any new prescription or non-prescription medications, including herbals,  vitamins, non-steroidal anti-inflammatory drugs (NSAIDs) and supplements.  This website has more information on Eliquis (apixaban): http://www.eliquis.com/eliquis/home

## 2019-10-25 DIAGNOSIS — I340 Nonrheumatic mitral (valve) insufficiency: Secondary | ICD-10-CM | POA: Diagnosis not present

## 2019-10-25 DIAGNOSIS — N183 Chronic kidney disease, stage 3 unspecified: Secondary | ICD-10-CM | POA: Diagnosis not present

## 2019-10-25 DIAGNOSIS — I4819 Other persistent atrial fibrillation: Secondary | ICD-10-CM | POA: Diagnosis not present

## 2019-10-25 DIAGNOSIS — I495 Sick sinus syndrome: Secondary | ICD-10-CM | POA: Diagnosis not present

## 2019-10-25 DIAGNOSIS — I5033 Acute on chronic diastolic (congestive) heart failure: Secondary | ICD-10-CM | POA: Diagnosis not present

## 2019-10-25 DIAGNOSIS — D631 Anemia in chronic kidney disease: Secondary | ICD-10-CM | POA: Diagnosis not present

## 2019-10-25 DIAGNOSIS — E785 Hyperlipidemia, unspecified: Secondary | ICD-10-CM | POA: Diagnosis not present

## 2019-10-25 DIAGNOSIS — I130 Hypertensive heart and chronic kidney disease with heart failure and stage 1 through stage 4 chronic kidney disease, or unspecified chronic kidney disease: Secondary | ICD-10-CM | POA: Diagnosis not present

## 2019-10-25 DIAGNOSIS — E039 Hypothyroidism, unspecified: Secondary | ICD-10-CM | POA: Diagnosis not present

## 2019-10-25 DIAGNOSIS — D6869 Other thrombophilia: Secondary | ICD-10-CM | POA: Diagnosis not present

## 2019-10-26 DIAGNOSIS — E785 Hyperlipidemia, unspecified: Secondary | ICD-10-CM | POA: Diagnosis not present

## 2019-10-26 DIAGNOSIS — I495 Sick sinus syndrome: Secondary | ICD-10-CM | POA: Diagnosis not present

## 2019-10-26 DIAGNOSIS — D631 Anemia in chronic kidney disease: Secondary | ICD-10-CM | POA: Diagnosis not present

## 2019-10-26 DIAGNOSIS — I5033 Acute on chronic diastolic (congestive) heart failure: Secondary | ICD-10-CM | POA: Diagnosis not present

## 2019-10-26 DIAGNOSIS — N183 Chronic kidney disease, stage 3 unspecified: Secondary | ICD-10-CM | POA: Diagnosis not present

## 2019-10-26 DIAGNOSIS — I130 Hypertensive heart and chronic kidney disease with heart failure and stage 1 through stage 4 chronic kidney disease, or unspecified chronic kidney disease: Secondary | ICD-10-CM | POA: Diagnosis not present

## 2019-10-26 DIAGNOSIS — E039 Hypothyroidism, unspecified: Secondary | ICD-10-CM | POA: Diagnosis not present

## 2019-10-26 DIAGNOSIS — I4819 Other persistent atrial fibrillation: Secondary | ICD-10-CM | POA: Diagnosis not present

## 2019-10-26 DIAGNOSIS — D6869 Other thrombophilia: Secondary | ICD-10-CM | POA: Diagnosis not present

## 2019-10-26 DIAGNOSIS — I340 Nonrheumatic mitral (valve) insufficiency: Secondary | ICD-10-CM | POA: Diagnosis not present

## 2019-10-28 DIAGNOSIS — I4819 Other persistent atrial fibrillation: Secondary | ICD-10-CM | POA: Diagnosis not present

## 2019-10-28 DIAGNOSIS — I5033 Acute on chronic diastolic (congestive) heart failure: Secondary | ICD-10-CM | POA: Diagnosis not present

## 2019-10-28 DIAGNOSIS — E039 Hypothyroidism, unspecified: Secondary | ICD-10-CM | POA: Diagnosis not present

## 2019-10-28 DIAGNOSIS — I495 Sick sinus syndrome: Secondary | ICD-10-CM | POA: Diagnosis not present

## 2019-10-28 DIAGNOSIS — E785 Hyperlipidemia, unspecified: Secondary | ICD-10-CM | POA: Diagnosis not present

## 2019-10-28 DIAGNOSIS — D631 Anemia in chronic kidney disease: Secondary | ICD-10-CM | POA: Diagnosis not present

## 2019-10-28 DIAGNOSIS — N183 Chronic kidney disease, stage 3 unspecified: Secondary | ICD-10-CM | POA: Diagnosis not present

## 2019-10-28 DIAGNOSIS — I130 Hypertensive heart and chronic kidney disease with heart failure and stage 1 through stage 4 chronic kidney disease, or unspecified chronic kidney disease: Secondary | ICD-10-CM | POA: Diagnosis not present

## 2019-10-28 DIAGNOSIS — I340 Nonrheumatic mitral (valve) insufficiency: Secondary | ICD-10-CM | POA: Diagnosis not present

## 2019-10-28 DIAGNOSIS — D6869 Other thrombophilia: Secondary | ICD-10-CM | POA: Diagnosis not present

## 2019-11-02 DIAGNOSIS — I4819 Other persistent atrial fibrillation: Secondary | ICD-10-CM | POA: Diagnosis not present

## 2019-11-02 DIAGNOSIS — E039 Hypothyroidism, unspecified: Secondary | ICD-10-CM | POA: Diagnosis not present

## 2019-11-02 DIAGNOSIS — D6869 Other thrombophilia: Secondary | ICD-10-CM | POA: Diagnosis not present

## 2019-11-02 DIAGNOSIS — E785 Hyperlipidemia, unspecified: Secondary | ICD-10-CM | POA: Diagnosis not present

## 2019-11-02 DIAGNOSIS — D631 Anemia in chronic kidney disease: Secondary | ICD-10-CM | POA: Diagnosis not present

## 2019-11-02 DIAGNOSIS — I495 Sick sinus syndrome: Secondary | ICD-10-CM | POA: Diagnosis not present

## 2019-11-02 DIAGNOSIS — I5033 Acute on chronic diastolic (congestive) heart failure: Secondary | ICD-10-CM | POA: Diagnosis not present

## 2019-11-02 DIAGNOSIS — I130 Hypertensive heart and chronic kidney disease with heart failure and stage 1 through stage 4 chronic kidney disease, or unspecified chronic kidney disease: Secondary | ICD-10-CM | POA: Diagnosis not present

## 2019-11-02 DIAGNOSIS — I340 Nonrheumatic mitral (valve) insufficiency: Secondary | ICD-10-CM | POA: Diagnosis not present

## 2019-11-02 DIAGNOSIS — N183 Chronic kidney disease, stage 3 unspecified: Secondary | ICD-10-CM | POA: Diagnosis not present

## 2019-11-03 DIAGNOSIS — I340 Nonrheumatic mitral (valve) insufficiency: Secondary | ICD-10-CM | POA: Diagnosis not present

## 2019-11-03 DIAGNOSIS — D631 Anemia in chronic kidney disease: Secondary | ICD-10-CM | POA: Diagnosis not present

## 2019-11-03 DIAGNOSIS — E785 Hyperlipidemia, unspecified: Secondary | ICD-10-CM | POA: Diagnosis not present

## 2019-11-03 DIAGNOSIS — I4819 Other persistent atrial fibrillation: Secondary | ICD-10-CM | POA: Diagnosis not present

## 2019-11-03 DIAGNOSIS — E039 Hypothyroidism, unspecified: Secondary | ICD-10-CM | POA: Diagnosis not present

## 2019-11-03 DIAGNOSIS — I5033 Acute on chronic diastolic (congestive) heart failure: Secondary | ICD-10-CM | POA: Diagnosis not present

## 2019-11-03 DIAGNOSIS — I495 Sick sinus syndrome: Secondary | ICD-10-CM | POA: Diagnosis not present

## 2019-11-03 DIAGNOSIS — N183 Chronic kidney disease, stage 3 unspecified: Secondary | ICD-10-CM | POA: Diagnosis not present

## 2019-11-03 DIAGNOSIS — I130 Hypertensive heart and chronic kidney disease with heart failure and stage 1 through stage 4 chronic kidney disease, or unspecified chronic kidney disease: Secondary | ICD-10-CM | POA: Diagnosis not present

## 2019-11-03 DIAGNOSIS — D6869 Other thrombophilia: Secondary | ICD-10-CM | POA: Diagnosis not present

## 2019-11-04 ENCOUNTER — Encounter: Payer: Medicare HMO | Admitting: Physician Assistant

## 2019-11-04 DIAGNOSIS — D631 Anemia in chronic kidney disease: Secondary | ICD-10-CM | POA: Diagnosis not present

## 2019-11-04 DIAGNOSIS — I340 Nonrheumatic mitral (valve) insufficiency: Secondary | ICD-10-CM | POA: Diagnosis not present

## 2019-11-04 DIAGNOSIS — I4819 Other persistent atrial fibrillation: Secondary | ICD-10-CM | POA: Diagnosis not present

## 2019-11-04 DIAGNOSIS — N183 Chronic kidney disease, stage 3 unspecified: Secondary | ICD-10-CM | POA: Diagnosis not present

## 2019-11-04 DIAGNOSIS — I130 Hypertensive heart and chronic kidney disease with heart failure and stage 1 through stage 4 chronic kidney disease, or unspecified chronic kidney disease: Secondary | ICD-10-CM | POA: Diagnosis not present

## 2019-11-04 DIAGNOSIS — D6869 Other thrombophilia: Secondary | ICD-10-CM | POA: Diagnosis not present

## 2019-11-04 DIAGNOSIS — I495 Sick sinus syndrome: Secondary | ICD-10-CM | POA: Diagnosis not present

## 2019-11-04 DIAGNOSIS — E785 Hyperlipidemia, unspecified: Secondary | ICD-10-CM | POA: Diagnosis not present

## 2019-11-04 DIAGNOSIS — I5033 Acute on chronic diastolic (congestive) heart failure: Secondary | ICD-10-CM | POA: Diagnosis not present

## 2019-11-04 DIAGNOSIS — E039 Hypothyroidism, unspecified: Secondary | ICD-10-CM | POA: Diagnosis not present

## 2019-11-05 DIAGNOSIS — I130 Hypertensive heart and chronic kidney disease with heart failure and stage 1 through stage 4 chronic kidney disease, or unspecified chronic kidney disease: Secondary | ICD-10-CM | POA: Diagnosis not present

## 2019-11-05 DIAGNOSIS — I5033 Acute on chronic diastolic (congestive) heart failure: Secondary | ICD-10-CM | POA: Diagnosis not present

## 2019-11-05 DIAGNOSIS — D631 Anemia in chronic kidney disease: Secondary | ICD-10-CM | POA: Diagnosis not present

## 2019-11-05 DIAGNOSIS — I4819 Other persistent atrial fibrillation: Secondary | ICD-10-CM | POA: Diagnosis not present

## 2019-11-05 DIAGNOSIS — I340 Nonrheumatic mitral (valve) insufficiency: Secondary | ICD-10-CM | POA: Diagnosis not present

## 2019-11-05 DIAGNOSIS — E039 Hypothyroidism, unspecified: Secondary | ICD-10-CM | POA: Diagnosis not present

## 2019-11-05 DIAGNOSIS — D6869 Other thrombophilia: Secondary | ICD-10-CM | POA: Diagnosis not present

## 2019-11-05 DIAGNOSIS — E785 Hyperlipidemia, unspecified: Secondary | ICD-10-CM | POA: Diagnosis not present

## 2019-11-05 DIAGNOSIS — N183 Chronic kidney disease, stage 3 unspecified: Secondary | ICD-10-CM | POA: Diagnosis not present

## 2019-11-05 DIAGNOSIS — I495 Sick sinus syndrome: Secondary | ICD-10-CM | POA: Diagnosis not present

## 2019-11-09 DIAGNOSIS — I5032 Chronic diastolic (congestive) heart failure: Secondary | ICD-10-CM | POA: Diagnosis not present

## 2019-11-10 DIAGNOSIS — N183 Chronic kidney disease, stage 3 unspecified: Secondary | ICD-10-CM | POA: Diagnosis not present

## 2019-11-10 DIAGNOSIS — I495 Sick sinus syndrome: Secondary | ICD-10-CM | POA: Diagnosis not present

## 2019-11-10 DIAGNOSIS — I340 Nonrheumatic mitral (valve) insufficiency: Secondary | ICD-10-CM | POA: Diagnosis not present

## 2019-11-10 DIAGNOSIS — D6869 Other thrombophilia: Secondary | ICD-10-CM | POA: Diagnosis not present

## 2019-11-10 DIAGNOSIS — E039 Hypothyroidism, unspecified: Secondary | ICD-10-CM | POA: Diagnosis not present

## 2019-11-10 DIAGNOSIS — I4819 Other persistent atrial fibrillation: Secondary | ICD-10-CM | POA: Diagnosis not present

## 2019-11-10 DIAGNOSIS — I5033 Acute on chronic diastolic (congestive) heart failure: Secondary | ICD-10-CM | POA: Diagnosis not present

## 2019-11-10 DIAGNOSIS — D631 Anemia in chronic kidney disease: Secondary | ICD-10-CM | POA: Diagnosis not present

## 2019-11-10 DIAGNOSIS — I130 Hypertensive heart and chronic kidney disease with heart failure and stage 1 through stage 4 chronic kidney disease, or unspecified chronic kidney disease: Secondary | ICD-10-CM | POA: Diagnosis not present

## 2019-11-10 DIAGNOSIS — E785 Hyperlipidemia, unspecified: Secondary | ICD-10-CM | POA: Diagnosis not present

## 2019-11-11 ENCOUNTER — Other Ambulatory Visit: Payer: Self-pay

## 2019-11-11 ENCOUNTER — Ambulatory Visit (INDEPENDENT_AMBULATORY_CARE_PROVIDER_SITE_OTHER): Payer: Medicare HMO | Admitting: Emergency Medicine

## 2019-11-11 DIAGNOSIS — I4891 Unspecified atrial fibrillation: Secondary | ICD-10-CM | POA: Diagnosis not present

## 2019-11-11 DIAGNOSIS — I495 Sick sinus syndrome: Secondary | ICD-10-CM

## 2019-11-11 DIAGNOSIS — I5032 Chronic diastolic (congestive) heart failure: Secondary | ICD-10-CM | POA: Diagnosis not present

## 2019-11-11 DIAGNOSIS — N189 Chronic kidney disease, unspecified: Secondary | ICD-10-CM | POA: Diagnosis not present

## 2019-11-11 DIAGNOSIS — N1831 Chronic kidney disease, stage 3a: Secondary | ICD-10-CM | POA: Diagnosis not present

## 2019-11-11 DIAGNOSIS — Z950 Presence of cardiac pacemaker: Secondary | ICD-10-CM | POA: Diagnosis not present

## 2019-11-11 DIAGNOSIS — R5383 Other fatigue: Secondary | ICD-10-CM | POA: Diagnosis not present

## 2019-11-11 DIAGNOSIS — R5381 Other malaise: Secondary | ICD-10-CM | POA: Diagnosis not present

## 2019-11-11 DIAGNOSIS — D631 Anemia in chronic kidney disease: Secondary | ICD-10-CM | POA: Diagnosis not present

## 2019-11-12 DIAGNOSIS — E785 Hyperlipidemia, unspecified: Secondary | ICD-10-CM | POA: Diagnosis not present

## 2019-11-12 DIAGNOSIS — I130 Hypertensive heart and chronic kidney disease with heart failure and stage 1 through stage 4 chronic kidney disease, or unspecified chronic kidney disease: Secondary | ICD-10-CM | POA: Diagnosis not present

## 2019-11-12 DIAGNOSIS — D6869 Other thrombophilia: Secondary | ICD-10-CM | POA: Diagnosis not present

## 2019-11-12 DIAGNOSIS — N183 Chronic kidney disease, stage 3 unspecified: Secondary | ICD-10-CM | POA: Diagnosis not present

## 2019-11-12 DIAGNOSIS — E039 Hypothyroidism, unspecified: Secondary | ICD-10-CM | POA: Diagnosis not present

## 2019-11-12 DIAGNOSIS — I340 Nonrheumatic mitral (valve) insufficiency: Secondary | ICD-10-CM | POA: Diagnosis not present

## 2019-11-12 DIAGNOSIS — I495 Sick sinus syndrome: Secondary | ICD-10-CM | POA: Diagnosis not present

## 2019-11-12 DIAGNOSIS — I5033 Acute on chronic diastolic (congestive) heart failure: Secondary | ICD-10-CM | POA: Diagnosis not present

## 2019-11-12 DIAGNOSIS — D631 Anemia in chronic kidney disease: Secondary | ICD-10-CM | POA: Diagnosis not present

## 2019-11-12 DIAGNOSIS — I4819 Other persistent atrial fibrillation: Secondary | ICD-10-CM | POA: Diagnosis not present

## 2019-11-12 LAB — CUP PACEART INCLINIC DEVICE CHECK
Battery Impedance: 454 Ohm
Battery Remaining Longevity: 93 mo
Battery Voltage: 2.78 V
Brady Statistic RV Percent Paced: 98 %
Date Time Interrogation Session: 20211028121400
Implantable Lead Implant Date: 20150507
Implantable Lead Implant Date: 20150507
Implantable Lead Location: 753859
Implantable Lead Location: 753860
Implantable Lead Model: 5092
Implantable Lead Model: 5592
Implantable Pulse Generator Implant Date: 20150507
Lead Channel Impedance Value: 546 Ohm
Lead Channel Impedance Value: 67 Ohm
Lead Channel Pacing Threshold Amplitude: 0.75 V
Lead Channel Pacing Threshold Pulse Width: 0.4 ms
Lead Channel Setting Pacing Amplitude: 2.5 V
Lead Channel Setting Pacing Pulse Width: 0.4 ms
Lead Channel Setting Sensing Sensitivity: 2 mV

## 2019-11-12 NOTE — Progress Notes (Signed)
Pacemaker check in clinic. Normal device function. Thresholds, sensing, impedances consistent with previous measurements. Device programmed to maximize longevity. 1 high ventricular rate noted with EGM that showed AF with RVR that was 4 seconds. Device programmed at appropriate safety margins. Histogram distribution appropriate for patient activity level. Device programmed to optimize intrinsic conduction. S/P ablation , lower rate decreased from 80 bpm to 70 bpm. Estimated longevity 7 years 9 months. Patient enrolled in remote follow-up and next remote 12/22/19. Patient education completed.

## 2019-11-17 DIAGNOSIS — D631 Anemia in chronic kidney disease: Secondary | ICD-10-CM | POA: Diagnosis not present

## 2019-11-17 DIAGNOSIS — E785 Hyperlipidemia, unspecified: Secondary | ICD-10-CM | POA: Diagnosis not present

## 2019-11-17 DIAGNOSIS — I130 Hypertensive heart and chronic kidney disease with heart failure and stage 1 through stage 4 chronic kidney disease, or unspecified chronic kidney disease: Secondary | ICD-10-CM | POA: Diagnosis not present

## 2019-11-17 DIAGNOSIS — N183 Chronic kidney disease, stage 3 unspecified: Secondary | ICD-10-CM | POA: Diagnosis not present

## 2019-11-17 DIAGNOSIS — I495 Sick sinus syndrome: Secondary | ICD-10-CM | POA: Diagnosis not present

## 2019-11-17 DIAGNOSIS — D6869 Other thrombophilia: Secondary | ICD-10-CM | POA: Diagnosis not present

## 2019-11-17 DIAGNOSIS — I4819 Other persistent atrial fibrillation: Secondary | ICD-10-CM | POA: Diagnosis not present

## 2019-11-17 DIAGNOSIS — I340 Nonrheumatic mitral (valve) insufficiency: Secondary | ICD-10-CM | POA: Diagnosis not present

## 2019-11-17 DIAGNOSIS — E039 Hypothyroidism, unspecified: Secondary | ICD-10-CM | POA: Diagnosis not present

## 2019-11-17 DIAGNOSIS — I5033 Acute on chronic diastolic (congestive) heart failure: Secondary | ICD-10-CM | POA: Diagnosis not present

## 2019-11-24 DIAGNOSIS — D6869 Other thrombophilia: Secondary | ICD-10-CM | POA: Diagnosis not present

## 2019-11-24 DIAGNOSIS — D631 Anemia in chronic kidney disease: Secondary | ICD-10-CM | POA: Diagnosis not present

## 2019-11-24 DIAGNOSIS — I5033 Acute on chronic diastolic (congestive) heart failure: Secondary | ICD-10-CM | POA: Diagnosis not present

## 2019-11-24 DIAGNOSIS — I340 Nonrheumatic mitral (valve) insufficiency: Secondary | ICD-10-CM | POA: Diagnosis not present

## 2019-11-24 DIAGNOSIS — I130 Hypertensive heart and chronic kidney disease with heart failure and stage 1 through stage 4 chronic kidney disease, or unspecified chronic kidney disease: Secondary | ICD-10-CM | POA: Diagnosis not present

## 2019-11-24 DIAGNOSIS — E039 Hypothyroidism, unspecified: Secondary | ICD-10-CM | POA: Diagnosis not present

## 2019-11-24 DIAGNOSIS — N183 Chronic kidney disease, stage 3 unspecified: Secondary | ICD-10-CM | POA: Diagnosis not present

## 2019-11-24 DIAGNOSIS — I495 Sick sinus syndrome: Secondary | ICD-10-CM | POA: Diagnosis not present

## 2019-11-24 DIAGNOSIS — E785 Hyperlipidemia, unspecified: Secondary | ICD-10-CM | POA: Diagnosis not present

## 2019-11-24 DIAGNOSIS — I4819 Other persistent atrial fibrillation: Secondary | ICD-10-CM | POA: Diagnosis not present

## 2019-12-10 DIAGNOSIS — I340 Nonrheumatic mitral (valve) insufficiency: Secondary | ICD-10-CM | POA: Diagnosis not present

## 2019-12-10 DIAGNOSIS — N183 Chronic kidney disease, stage 3 unspecified: Secondary | ICD-10-CM | POA: Diagnosis not present

## 2019-12-10 DIAGNOSIS — I495 Sick sinus syndrome: Secondary | ICD-10-CM | POA: Diagnosis not present

## 2019-12-10 DIAGNOSIS — E039 Hypothyroidism, unspecified: Secondary | ICD-10-CM | POA: Diagnosis not present

## 2019-12-10 DIAGNOSIS — I5033 Acute on chronic diastolic (congestive) heart failure: Secondary | ICD-10-CM | POA: Diagnosis not present

## 2019-12-10 DIAGNOSIS — I4819 Other persistent atrial fibrillation: Secondary | ICD-10-CM | POA: Diagnosis not present

## 2019-12-10 DIAGNOSIS — I130 Hypertensive heart and chronic kidney disease with heart failure and stage 1 through stage 4 chronic kidney disease, or unspecified chronic kidney disease: Secondary | ICD-10-CM | POA: Diagnosis not present

## 2019-12-10 DIAGNOSIS — D631 Anemia in chronic kidney disease: Secondary | ICD-10-CM | POA: Diagnosis not present

## 2019-12-10 DIAGNOSIS — E785 Hyperlipidemia, unspecified: Secondary | ICD-10-CM | POA: Diagnosis not present

## 2019-12-10 DIAGNOSIS — D6869 Other thrombophilia: Secondary | ICD-10-CM | POA: Diagnosis not present

## 2019-12-13 ENCOUNTER — Other Ambulatory Visit: Payer: Self-pay | Admitting: Student

## 2019-12-15 DIAGNOSIS — D631 Anemia in chronic kidney disease: Secondary | ICD-10-CM | POA: Diagnosis not present

## 2019-12-15 DIAGNOSIS — I495 Sick sinus syndrome: Secondary | ICD-10-CM | POA: Diagnosis not present

## 2019-12-15 DIAGNOSIS — N183 Chronic kidney disease, stage 3 unspecified: Secondary | ICD-10-CM | POA: Diagnosis not present

## 2019-12-15 DIAGNOSIS — I340 Nonrheumatic mitral (valve) insufficiency: Secondary | ICD-10-CM | POA: Diagnosis not present

## 2019-12-15 DIAGNOSIS — I130 Hypertensive heart and chronic kidney disease with heart failure and stage 1 through stage 4 chronic kidney disease, or unspecified chronic kidney disease: Secondary | ICD-10-CM | POA: Diagnosis not present

## 2019-12-15 DIAGNOSIS — E039 Hypothyroidism, unspecified: Secondary | ICD-10-CM | POA: Diagnosis not present

## 2019-12-15 DIAGNOSIS — E785 Hyperlipidemia, unspecified: Secondary | ICD-10-CM | POA: Diagnosis not present

## 2019-12-15 DIAGNOSIS — I4819 Other persistent atrial fibrillation: Secondary | ICD-10-CM | POA: Diagnosis not present

## 2019-12-15 DIAGNOSIS — D6869 Other thrombophilia: Secondary | ICD-10-CM | POA: Diagnosis not present

## 2019-12-15 DIAGNOSIS — I5033 Acute on chronic diastolic (congestive) heart failure: Secondary | ICD-10-CM | POA: Diagnosis not present

## 2019-12-21 DIAGNOSIS — D6869 Other thrombophilia: Secondary | ICD-10-CM | POA: Diagnosis not present

## 2019-12-21 DIAGNOSIS — D631 Anemia in chronic kidney disease: Secondary | ICD-10-CM | POA: Diagnosis not present

## 2019-12-21 DIAGNOSIS — E039 Hypothyroidism, unspecified: Secondary | ICD-10-CM | POA: Diagnosis not present

## 2019-12-21 DIAGNOSIS — I4819 Other persistent atrial fibrillation: Secondary | ICD-10-CM | POA: Diagnosis not present

## 2019-12-21 DIAGNOSIS — I5033 Acute on chronic diastolic (congestive) heart failure: Secondary | ICD-10-CM | POA: Diagnosis not present

## 2019-12-21 DIAGNOSIS — I130 Hypertensive heart and chronic kidney disease with heart failure and stage 1 through stage 4 chronic kidney disease, or unspecified chronic kidney disease: Secondary | ICD-10-CM | POA: Diagnosis not present

## 2019-12-21 DIAGNOSIS — I340 Nonrheumatic mitral (valve) insufficiency: Secondary | ICD-10-CM | POA: Diagnosis not present

## 2019-12-21 DIAGNOSIS — N183 Chronic kidney disease, stage 3 unspecified: Secondary | ICD-10-CM | POA: Diagnosis not present

## 2019-12-21 DIAGNOSIS — E785 Hyperlipidemia, unspecified: Secondary | ICD-10-CM | POA: Diagnosis not present

## 2019-12-21 DIAGNOSIS — I495 Sick sinus syndrome: Secondary | ICD-10-CM | POA: Diagnosis not present

## 2020-01-06 DIAGNOSIS — I5032 Chronic diastolic (congestive) heart failure: Secondary | ICD-10-CM | POA: Diagnosis not present

## 2020-01-06 DIAGNOSIS — R69 Illness, unspecified: Secondary | ICD-10-CM | POA: Diagnosis not present

## 2020-01-06 DIAGNOSIS — I509 Heart failure, unspecified: Secondary | ICD-10-CM | POA: Diagnosis not present

## 2020-01-07 ENCOUNTER — Other Ambulatory Visit: Payer: Self-pay | Admitting: Student

## 2020-01-26 ENCOUNTER — Encounter: Payer: Medicare HMO | Admitting: Internal Medicine

## 2020-02-08 ENCOUNTER — Other Ambulatory Visit: Payer: Self-pay | Admitting: Physician Assistant

## 2020-02-08 ENCOUNTER — Other Ambulatory Visit: Payer: Self-pay | Admitting: Student

## 2020-02-10 DIAGNOSIS — R410 Disorientation, unspecified: Secondary | ICD-10-CM | POA: Diagnosis not present

## 2020-02-10 DIAGNOSIS — K591 Functional diarrhea: Secondary | ICD-10-CM | POA: Diagnosis not present

## 2020-02-10 DIAGNOSIS — R300 Dysuria: Secondary | ICD-10-CM | POA: Diagnosis not present

## 2020-02-15 ENCOUNTER — Other Ambulatory Visit: Payer: Self-pay | Admitting: Student

## 2020-02-15 DIAGNOSIS — I5032 Chronic diastolic (congestive) heart failure: Secondary | ICD-10-CM | POA: Diagnosis not present

## 2020-02-15 DIAGNOSIS — N189 Chronic kidney disease, unspecified: Secondary | ICD-10-CM | POA: Diagnosis not present

## 2020-02-15 DIAGNOSIS — E871 Hypo-osmolality and hyponatremia: Secondary | ICD-10-CM | POA: Diagnosis not present

## 2020-02-15 DIAGNOSIS — D638 Anemia in other chronic diseases classified elsewhere: Secondary | ICD-10-CM | POA: Diagnosis not present

## 2020-02-15 DIAGNOSIS — R627 Adult failure to thrive: Secondary | ICD-10-CM | POA: Diagnosis not present

## 2020-02-15 DIAGNOSIS — D649 Anemia, unspecified: Secondary | ICD-10-CM | POA: Diagnosis not present

## 2020-02-15 DIAGNOSIS — Z7901 Long term (current) use of anticoagulants: Secondary | ICD-10-CM | POA: Diagnosis not present

## 2020-02-15 DIAGNOSIS — R5383 Other fatigue: Secondary | ICD-10-CM | POA: Diagnosis not present

## 2020-02-15 DIAGNOSIS — I482 Chronic atrial fibrillation, unspecified: Secondary | ICD-10-CM | POA: Diagnosis not present

## 2020-02-15 DIAGNOSIS — Z20822 Contact with and (suspected) exposure to covid-19: Secondary | ICD-10-CM | POA: Diagnosis not present

## 2020-02-15 DIAGNOSIS — E039 Hypothyroidism, unspecified: Secondary | ICD-10-CM | POA: Diagnosis not present

## 2020-02-15 DIAGNOSIS — E43 Unspecified severe protein-calorie malnutrition: Secondary | ICD-10-CM | POA: Diagnosis not present

## 2020-02-15 DIAGNOSIS — I480 Paroxysmal atrial fibrillation: Secondary | ICD-10-CM | POA: Diagnosis not present

## 2020-02-15 DIAGNOSIS — K8020 Calculus of gallbladder without cholecystitis without obstruction: Secondary | ICD-10-CM | POA: Diagnosis not present

## 2020-02-16 ENCOUNTER — Encounter: Payer: Medicare HMO | Admitting: Internal Medicine

## 2020-02-16 DIAGNOSIS — R911 Solitary pulmonary nodule: Secondary | ICD-10-CM | POA: Diagnosis not present

## 2020-02-16 DIAGNOSIS — E039 Hypothyroidism, unspecified: Secondary | ICD-10-CM | POA: Diagnosis not present

## 2020-02-16 DIAGNOSIS — D649 Anemia, unspecified: Secondary | ICD-10-CM | POA: Diagnosis not present

## 2020-02-16 DIAGNOSIS — R5382 Chronic fatigue, unspecified: Secondary | ICD-10-CM | POA: Diagnosis not present

## 2020-02-16 DIAGNOSIS — E43 Unspecified severe protein-calorie malnutrition: Secondary | ICD-10-CM | POA: Diagnosis not present

## 2020-02-16 DIAGNOSIS — R627 Adult failure to thrive: Secondary | ICD-10-CM | POA: Diagnosis not present

## 2020-02-16 DIAGNOSIS — I480 Paroxysmal atrial fibrillation: Secondary | ICD-10-CM | POA: Diagnosis not present

## 2020-02-16 DIAGNOSIS — K8020 Calculus of gallbladder without cholecystitis without obstruction: Secondary | ICD-10-CM | POA: Diagnosis not present

## 2020-02-16 DIAGNOSIS — E871 Hypo-osmolality and hyponatremia: Secondary | ICD-10-CM | POA: Diagnosis not present

## 2020-02-16 DIAGNOSIS — Z20822 Contact with and (suspected) exposure to covid-19: Secondary | ICD-10-CM | POA: Diagnosis not present

## 2020-02-16 DIAGNOSIS — N281 Cyst of kidney, acquired: Secondary | ICD-10-CM | POA: Diagnosis not present

## 2020-02-16 DIAGNOSIS — I5032 Chronic diastolic (congestive) heart failure: Secondary | ICD-10-CM | POA: Diagnosis not present

## 2020-02-16 DIAGNOSIS — N189 Chronic kidney disease, unspecified: Secondary | ICD-10-CM | POA: Diagnosis not present

## 2020-02-17 DIAGNOSIS — I5032 Chronic diastolic (congestive) heart failure: Secondary | ICD-10-CM | POA: Diagnosis not present

## 2020-02-17 DIAGNOSIS — I480 Paroxysmal atrial fibrillation: Secondary | ICD-10-CM | POA: Diagnosis not present

## 2020-02-17 DIAGNOSIS — K8020 Calculus of gallbladder without cholecystitis without obstruction: Secondary | ICD-10-CM | POA: Diagnosis not present

## 2020-02-17 DIAGNOSIS — E039 Hypothyroidism, unspecified: Secondary | ICD-10-CM | POA: Diagnosis not present

## 2020-02-17 DIAGNOSIS — E43 Unspecified severe protein-calorie malnutrition: Secondary | ICD-10-CM | POA: Diagnosis not present

## 2020-02-17 DIAGNOSIS — Z950 Presence of cardiac pacemaker: Secondary | ICD-10-CM | POA: Diagnosis not present

## 2020-02-17 DIAGNOSIS — R5382 Chronic fatigue, unspecified: Secondary | ICD-10-CM | POA: Diagnosis not present

## 2020-02-17 DIAGNOSIS — Z20822 Contact with and (suspected) exposure to covid-19: Secondary | ICD-10-CM | POA: Diagnosis not present

## 2020-02-17 DIAGNOSIS — N189 Chronic kidney disease, unspecified: Secondary | ICD-10-CM | POA: Diagnosis not present

## 2020-02-17 DIAGNOSIS — R627 Adult failure to thrive: Secondary | ICD-10-CM | POA: Diagnosis not present

## 2020-02-17 DIAGNOSIS — R000 Tachycardia, unspecified: Secondary | ICD-10-CM | POA: Diagnosis not present

## 2020-02-17 DIAGNOSIS — E871 Hypo-osmolality and hyponatremia: Secondary | ICD-10-CM | POA: Diagnosis not present

## 2020-02-17 DIAGNOSIS — D649 Anemia, unspecified: Secondary | ICD-10-CM | POA: Diagnosis not present

## 2020-02-18 DIAGNOSIS — E43 Unspecified severe protein-calorie malnutrition: Secondary | ICD-10-CM | POA: Diagnosis not present

## 2020-02-18 DIAGNOSIS — D649 Anemia, unspecified: Secondary | ICD-10-CM | POA: Diagnosis not present

## 2020-02-18 DIAGNOSIS — E039 Hypothyroidism, unspecified: Secondary | ICD-10-CM | POA: Diagnosis not present

## 2020-02-18 DIAGNOSIS — I480 Paroxysmal atrial fibrillation: Secondary | ICD-10-CM | POA: Diagnosis not present

## 2020-02-18 DIAGNOSIS — I5032 Chronic diastolic (congestive) heart failure: Secondary | ICD-10-CM | POA: Diagnosis not present

## 2020-02-18 DIAGNOSIS — N189 Chronic kidney disease, unspecified: Secondary | ICD-10-CM | POA: Diagnosis not present

## 2020-02-18 DIAGNOSIS — E871 Hypo-osmolality and hyponatremia: Secondary | ICD-10-CM | POA: Diagnosis not present

## 2020-02-18 DIAGNOSIS — R627 Adult failure to thrive: Secondary | ICD-10-CM | POA: Diagnosis not present

## 2020-02-18 DIAGNOSIS — Z20822 Contact with and (suspected) exposure to covid-19: Secondary | ICD-10-CM | POA: Diagnosis not present

## 2020-02-18 DIAGNOSIS — K8020 Calculus of gallbladder without cholecystitis without obstruction: Secondary | ICD-10-CM | POA: Diagnosis not present

## 2020-02-20 ENCOUNTER — Other Ambulatory Visit: Payer: Self-pay | Admitting: Student

## 2020-02-22 DIAGNOSIS — D649 Anemia, unspecified: Secondary | ICD-10-CM | POA: Diagnosis not present

## 2020-02-22 DIAGNOSIS — R911 Solitary pulmonary nodule: Secondary | ICD-10-CM | POA: Diagnosis not present

## 2020-02-22 DIAGNOSIS — E871 Hypo-osmolality and hyponatremia: Secondary | ICD-10-CM | POA: Diagnosis not present

## 2020-02-22 DIAGNOSIS — R627 Adult failure to thrive: Secondary | ICD-10-CM | POA: Diagnosis not present

## 2020-02-22 DIAGNOSIS — I480 Paroxysmal atrial fibrillation: Secondary | ICD-10-CM | POA: Diagnosis not present

## 2020-02-22 DIAGNOSIS — I5032 Chronic diastolic (congestive) heart failure: Secondary | ICD-10-CM | POA: Diagnosis not present

## 2020-02-22 DIAGNOSIS — N183 Chronic kidney disease, stage 3 unspecified: Secondary | ICD-10-CM | POA: Diagnosis not present

## 2020-02-22 DIAGNOSIS — Z950 Presence of cardiac pacemaker: Secondary | ICD-10-CM | POA: Diagnosis not present

## 2020-02-22 DIAGNOSIS — R011 Cardiac murmur, unspecified: Secondary | ICD-10-CM | POA: Diagnosis not present

## 2020-02-22 DIAGNOSIS — I340 Nonrheumatic mitral (valve) insufficiency: Secondary | ICD-10-CM | POA: Diagnosis not present

## 2020-02-24 DIAGNOSIS — R011 Cardiac murmur, unspecified: Secondary | ICD-10-CM | POA: Diagnosis not present

## 2020-02-24 DIAGNOSIS — N183 Chronic kidney disease, stage 3 unspecified: Secondary | ICD-10-CM | POA: Diagnosis not present

## 2020-02-24 DIAGNOSIS — I480 Paroxysmal atrial fibrillation: Secondary | ICD-10-CM | POA: Diagnosis not present

## 2020-02-24 DIAGNOSIS — D649 Anemia, unspecified: Secondary | ICD-10-CM | POA: Diagnosis not present

## 2020-02-24 DIAGNOSIS — R911 Solitary pulmonary nodule: Secondary | ICD-10-CM | POA: Diagnosis not present

## 2020-02-24 DIAGNOSIS — I340 Nonrheumatic mitral (valve) insufficiency: Secondary | ICD-10-CM | POA: Diagnosis not present

## 2020-02-24 DIAGNOSIS — Z950 Presence of cardiac pacemaker: Secondary | ICD-10-CM | POA: Diagnosis not present

## 2020-02-24 DIAGNOSIS — E871 Hypo-osmolality and hyponatremia: Secondary | ICD-10-CM | POA: Diagnosis not present

## 2020-02-24 DIAGNOSIS — I5032 Chronic diastolic (congestive) heart failure: Secondary | ICD-10-CM | POA: Diagnosis not present

## 2020-02-24 DIAGNOSIS — R627 Adult failure to thrive: Secondary | ICD-10-CM | POA: Diagnosis not present

## 2020-02-29 DIAGNOSIS — Z950 Presence of cardiac pacemaker: Secondary | ICD-10-CM | POA: Diagnosis not present

## 2020-02-29 DIAGNOSIS — I480 Paroxysmal atrial fibrillation: Secondary | ICD-10-CM | POA: Diagnosis not present

## 2020-02-29 DIAGNOSIS — R627 Adult failure to thrive: Secondary | ICD-10-CM | POA: Diagnosis not present

## 2020-02-29 DIAGNOSIS — R011 Cardiac murmur, unspecified: Secondary | ICD-10-CM | POA: Diagnosis not present

## 2020-02-29 DIAGNOSIS — R911 Solitary pulmonary nodule: Secondary | ICD-10-CM | POA: Diagnosis not present

## 2020-02-29 DIAGNOSIS — N183 Chronic kidney disease, stage 3 unspecified: Secondary | ICD-10-CM | POA: Diagnosis not present

## 2020-02-29 DIAGNOSIS — I5032 Chronic diastolic (congestive) heart failure: Secondary | ICD-10-CM | POA: Diagnosis not present

## 2020-02-29 DIAGNOSIS — I340 Nonrheumatic mitral (valve) insufficiency: Secondary | ICD-10-CM | POA: Diagnosis not present

## 2020-02-29 DIAGNOSIS — D649 Anemia, unspecified: Secondary | ICD-10-CM | POA: Diagnosis not present

## 2020-02-29 DIAGNOSIS — E871 Hypo-osmolality and hyponatremia: Secondary | ICD-10-CM | POA: Diagnosis not present

## 2020-03-02 DIAGNOSIS — I5032 Chronic diastolic (congestive) heart failure: Secondary | ICD-10-CM | POA: Diagnosis not present

## 2020-03-02 DIAGNOSIS — E871 Hypo-osmolality and hyponatremia: Secondary | ICD-10-CM | POA: Diagnosis not present

## 2020-03-02 DIAGNOSIS — I480 Paroxysmal atrial fibrillation: Secondary | ICD-10-CM | POA: Diagnosis not present

## 2020-03-02 DIAGNOSIS — I340 Nonrheumatic mitral (valve) insufficiency: Secondary | ICD-10-CM | POA: Diagnosis not present

## 2020-03-02 DIAGNOSIS — R911 Solitary pulmonary nodule: Secondary | ICD-10-CM | POA: Diagnosis not present

## 2020-03-02 DIAGNOSIS — R627 Adult failure to thrive: Secondary | ICD-10-CM | POA: Diagnosis not present

## 2020-03-02 DIAGNOSIS — Z950 Presence of cardiac pacemaker: Secondary | ICD-10-CM | POA: Diagnosis not present

## 2020-03-02 DIAGNOSIS — N183 Chronic kidney disease, stage 3 unspecified: Secondary | ICD-10-CM | POA: Diagnosis not present

## 2020-03-02 DIAGNOSIS — R011 Cardiac murmur, unspecified: Secondary | ICD-10-CM | POA: Diagnosis not present

## 2020-03-02 DIAGNOSIS — D649 Anemia, unspecified: Secondary | ICD-10-CM | POA: Diagnosis not present

## 2020-03-03 DIAGNOSIS — Z950 Presence of cardiac pacemaker: Secondary | ICD-10-CM | POA: Diagnosis not present

## 2020-03-03 DIAGNOSIS — D649 Anemia, unspecified: Secondary | ICD-10-CM | POA: Diagnosis not present

## 2020-03-03 DIAGNOSIS — I5032 Chronic diastolic (congestive) heart failure: Secondary | ICD-10-CM | POA: Diagnosis not present

## 2020-03-03 DIAGNOSIS — I480 Paroxysmal atrial fibrillation: Secondary | ICD-10-CM | POA: Diagnosis not present

## 2020-03-03 DIAGNOSIS — N183 Chronic kidney disease, stage 3 unspecified: Secondary | ICD-10-CM | POA: Diagnosis not present

## 2020-03-03 DIAGNOSIS — R911 Solitary pulmonary nodule: Secondary | ICD-10-CM | POA: Diagnosis not present

## 2020-03-03 DIAGNOSIS — E871 Hypo-osmolality and hyponatremia: Secondary | ICD-10-CM | POA: Diagnosis not present

## 2020-03-03 DIAGNOSIS — I340 Nonrheumatic mitral (valve) insufficiency: Secondary | ICD-10-CM | POA: Diagnosis not present

## 2020-03-03 DIAGNOSIS — R011 Cardiac murmur, unspecified: Secondary | ICD-10-CM | POA: Diagnosis not present

## 2020-03-03 DIAGNOSIS — R627 Adult failure to thrive: Secondary | ICD-10-CM | POA: Diagnosis not present

## 2020-03-06 DIAGNOSIS — R69 Illness, unspecified: Secondary | ICD-10-CM | POA: Diagnosis not present

## 2020-03-06 DIAGNOSIS — E261 Secondary hyperaldosteronism: Secondary | ICD-10-CM | POA: Diagnosis not present

## 2020-03-06 DIAGNOSIS — R197 Diarrhea, unspecified: Secondary | ICD-10-CM | POA: Diagnosis not present

## 2020-03-06 DIAGNOSIS — Z7409 Other reduced mobility: Secondary | ICD-10-CM | POA: Diagnosis not present

## 2020-03-06 DIAGNOSIS — R636 Underweight: Secondary | ICD-10-CM | POA: Diagnosis not present

## 2020-03-06 DIAGNOSIS — E039 Hypothyroidism, unspecified: Secondary | ICD-10-CM | POA: Diagnosis not present

## 2020-03-06 DIAGNOSIS — I509 Heart failure, unspecified: Secondary | ICD-10-CM | POA: Diagnosis not present

## 2020-03-06 DIAGNOSIS — Z008 Encounter for other general examination: Secondary | ICD-10-CM | POA: Diagnosis not present

## 2020-03-06 DIAGNOSIS — Z681 Body mass index (BMI) 19 or less, adult: Secondary | ICD-10-CM | POA: Diagnosis not present

## 2020-03-06 DIAGNOSIS — D6869 Other thrombophilia: Secondary | ICD-10-CM | POA: Diagnosis not present

## 2020-03-06 DIAGNOSIS — I4891 Unspecified atrial fibrillation: Secondary | ICD-10-CM | POA: Diagnosis not present

## 2020-03-07 ENCOUNTER — Other Ambulatory Visit (HOSPITAL_COMMUNITY): Payer: Self-pay | Admitting: Internal Medicine

## 2020-03-07 DIAGNOSIS — R911 Solitary pulmonary nodule: Secondary | ICD-10-CM | POA: Diagnosis not present

## 2020-03-07 DIAGNOSIS — R627 Adult failure to thrive: Secondary | ICD-10-CM | POA: Diagnosis not present

## 2020-03-07 DIAGNOSIS — R011 Cardiac murmur, unspecified: Secondary | ICD-10-CM | POA: Diagnosis not present

## 2020-03-07 DIAGNOSIS — N183 Chronic kidney disease, stage 3 unspecified: Secondary | ICD-10-CM | POA: Diagnosis not present

## 2020-03-07 DIAGNOSIS — D649 Anemia, unspecified: Secondary | ICD-10-CM | POA: Diagnosis not present

## 2020-03-07 DIAGNOSIS — I340 Nonrheumatic mitral (valve) insufficiency: Secondary | ICD-10-CM | POA: Diagnosis not present

## 2020-03-07 DIAGNOSIS — E871 Hypo-osmolality and hyponatremia: Secondary | ICD-10-CM | POA: Diagnosis not present

## 2020-03-07 DIAGNOSIS — Z950 Presence of cardiac pacemaker: Secondary | ICD-10-CM | POA: Diagnosis not present

## 2020-03-07 DIAGNOSIS — I5032 Chronic diastolic (congestive) heart failure: Secondary | ICD-10-CM | POA: Diagnosis not present

## 2020-03-07 DIAGNOSIS — I480 Paroxysmal atrial fibrillation: Secondary | ICD-10-CM | POA: Diagnosis not present

## 2020-03-07 NOTE — Telephone Encounter (Signed)
Pt last saw Dr Rayann Heman 08/24/19, last labs 10/01/19 Creat 1.04, age 87, weight 55.9kg, based on specified criteria pt is on appropriate dosage of Eliquis 2.78m BID.  Will refill rx.

## 2020-03-08 DIAGNOSIS — I340 Nonrheumatic mitral (valve) insufficiency: Secondary | ICD-10-CM | POA: Diagnosis not present

## 2020-03-08 DIAGNOSIS — I5032 Chronic diastolic (congestive) heart failure: Secondary | ICD-10-CM | POA: Diagnosis not present

## 2020-03-08 DIAGNOSIS — D649 Anemia, unspecified: Secondary | ICD-10-CM | POA: Diagnosis not present

## 2020-03-08 DIAGNOSIS — R911 Solitary pulmonary nodule: Secondary | ICD-10-CM | POA: Diagnosis not present

## 2020-03-08 DIAGNOSIS — Z950 Presence of cardiac pacemaker: Secondary | ICD-10-CM | POA: Diagnosis not present

## 2020-03-08 DIAGNOSIS — N183 Chronic kidney disease, stage 3 unspecified: Secondary | ICD-10-CM | POA: Diagnosis not present

## 2020-03-08 DIAGNOSIS — I480 Paroxysmal atrial fibrillation: Secondary | ICD-10-CM | POA: Diagnosis not present

## 2020-03-08 DIAGNOSIS — R011 Cardiac murmur, unspecified: Secondary | ICD-10-CM | POA: Diagnosis not present

## 2020-03-08 DIAGNOSIS — R627 Adult failure to thrive: Secondary | ICD-10-CM | POA: Diagnosis not present

## 2020-03-08 DIAGNOSIS — E871 Hypo-osmolality and hyponatremia: Secondary | ICD-10-CM | POA: Diagnosis not present

## 2020-03-09 DIAGNOSIS — R627 Adult failure to thrive: Secondary | ICD-10-CM | POA: Diagnosis not present

## 2020-03-09 DIAGNOSIS — I480 Paroxysmal atrial fibrillation: Secondary | ICD-10-CM | POA: Diagnosis not present

## 2020-03-09 DIAGNOSIS — I340 Nonrheumatic mitral (valve) insufficiency: Secondary | ICD-10-CM | POA: Diagnosis not present

## 2020-03-09 DIAGNOSIS — R911 Solitary pulmonary nodule: Secondary | ICD-10-CM | POA: Diagnosis not present

## 2020-03-09 DIAGNOSIS — I5032 Chronic diastolic (congestive) heart failure: Secondary | ICD-10-CM | POA: Diagnosis not present

## 2020-03-09 DIAGNOSIS — N183 Chronic kidney disease, stage 3 unspecified: Secondary | ICD-10-CM | POA: Diagnosis not present

## 2020-03-09 DIAGNOSIS — E871 Hypo-osmolality and hyponatremia: Secondary | ICD-10-CM | POA: Diagnosis not present

## 2020-03-09 DIAGNOSIS — Z950 Presence of cardiac pacemaker: Secondary | ICD-10-CM | POA: Diagnosis not present

## 2020-03-09 DIAGNOSIS — R011 Cardiac murmur, unspecified: Secondary | ICD-10-CM | POA: Diagnosis not present

## 2020-03-09 DIAGNOSIS — D649 Anemia, unspecified: Secondary | ICD-10-CM | POA: Diagnosis not present

## 2020-03-10 DIAGNOSIS — R011 Cardiac murmur, unspecified: Secondary | ICD-10-CM | POA: Diagnosis not present

## 2020-03-10 DIAGNOSIS — I5032 Chronic diastolic (congestive) heart failure: Secondary | ICD-10-CM | POA: Diagnosis not present

## 2020-03-10 DIAGNOSIS — N183 Chronic kidney disease, stage 3 unspecified: Secondary | ICD-10-CM | POA: Diagnosis not present

## 2020-03-10 DIAGNOSIS — R911 Solitary pulmonary nodule: Secondary | ICD-10-CM | POA: Diagnosis not present

## 2020-03-10 DIAGNOSIS — Z950 Presence of cardiac pacemaker: Secondary | ICD-10-CM | POA: Diagnosis not present

## 2020-03-10 DIAGNOSIS — D649 Anemia, unspecified: Secondary | ICD-10-CM | POA: Diagnosis not present

## 2020-03-10 DIAGNOSIS — E871 Hypo-osmolality and hyponatremia: Secondary | ICD-10-CM | POA: Diagnosis not present

## 2020-03-10 DIAGNOSIS — I480 Paroxysmal atrial fibrillation: Secondary | ICD-10-CM | POA: Diagnosis not present

## 2020-03-10 DIAGNOSIS — R627 Adult failure to thrive: Secondary | ICD-10-CM | POA: Diagnosis not present

## 2020-03-10 DIAGNOSIS — I340 Nonrheumatic mitral (valve) insufficiency: Secondary | ICD-10-CM | POA: Diagnosis not present

## 2020-03-14 DIAGNOSIS — D649 Anemia, unspecified: Secondary | ICD-10-CM | POA: Diagnosis not present

## 2020-03-14 DIAGNOSIS — E871 Hypo-osmolality and hyponatremia: Secondary | ICD-10-CM | POA: Diagnosis not present

## 2020-03-14 DIAGNOSIS — R627 Adult failure to thrive: Secondary | ICD-10-CM | POA: Diagnosis not present

## 2020-03-14 DIAGNOSIS — R011 Cardiac murmur, unspecified: Secondary | ICD-10-CM | POA: Diagnosis not present

## 2020-03-14 DIAGNOSIS — Z950 Presence of cardiac pacemaker: Secondary | ICD-10-CM | POA: Diagnosis not present

## 2020-03-14 DIAGNOSIS — I340 Nonrheumatic mitral (valve) insufficiency: Secondary | ICD-10-CM | POA: Diagnosis not present

## 2020-03-14 DIAGNOSIS — N183 Chronic kidney disease, stage 3 unspecified: Secondary | ICD-10-CM | POA: Diagnosis not present

## 2020-03-14 DIAGNOSIS — R911 Solitary pulmonary nodule: Secondary | ICD-10-CM | POA: Diagnosis not present

## 2020-03-14 DIAGNOSIS — I480 Paroxysmal atrial fibrillation: Secondary | ICD-10-CM | POA: Diagnosis not present

## 2020-03-14 DIAGNOSIS — I5032 Chronic diastolic (congestive) heart failure: Secondary | ICD-10-CM | POA: Diagnosis not present

## 2020-03-15 DIAGNOSIS — N183 Chronic kidney disease, stage 3 unspecified: Secondary | ICD-10-CM | POA: Diagnosis not present

## 2020-03-15 DIAGNOSIS — E871 Hypo-osmolality and hyponatremia: Secondary | ICD-10-CM | POA: Diagnosis not present

## 2020-03-15 DIAGNOSIS — Z950 Presence of cardiac pacemaker: Secondary | ICD-10-CM | POA: Diagnosis not present

## 2020-03-15 DIAGNOSIS — R911 Solitary pulmonary nodule: Secondary | ICD-10-CM | POA: Diagnosis not present

## 2020-03-15 DIAGNOSIS — R011 Cardiac murmur, unspecified: Secondary | ICD-10-CM | POA: Diagnosis not present

## 2020-03-15 DIAGNOSIS — R627 Adult failure to thrive: Secondary | ICD-10-CM | POA: Diagnosis not present

## 2020-03-15 DIAGNOSIS — D649 Anemia, unspecified: Secondary | ICD-10-CM | POA: Diagnosis not present

## 2020-03-15 DIAGNOSIS — I340 Nonrheumatic mitral (valve) insufficiency: Secondary | ICD-10-CM | POA: Diagnosis not present

## 2020-03-15 DIAGNOSIS — I5032 Chronic diastolic (congestive) heart failure: Secondary | ICD-10-CM | POA: Diagnosis not present

## 2020-03-15 DIAGNOSIS — I480 Paroxysmal atrial fibrillation: Secondary | ICD-10-CM | POA: Diagnosis not present

## 2020-03-16 DIAGNOSIS — R911 Solitary pulmonary nodule: Secondary | ICD-10-CM | POA: Diagnosis not present

## 2020-03-16 DIAGNOSIS — R011 Cardiac murmur, unspecified: Secondary | ICD-10-CM | POA: Diagnosis not present

## 2020-03-16 DIAGNOSIS — I480 Paroxysmal atrial fibrillation: Secondary | ICD-10-CM | POA: Diagnosis not present

## 2020-03-16 DIAGNOSIS — I340 Nonrheumatic mitral (valve) insufficiency: Secondary | ICD-10-CM | POA: Diagnosis not present

## 2020-03-16 DIAGNOSIS — I5032 Chronic diastolic (congestive) heart failure: Secondary | ICD-10-CM | POA: Diagnosis not present

## 2020-03-16 DIAGNOSIS — Z950 Presence of cardiac pacemaker: Secondary | ICD-10-CM | POA: Diagnosis not present

## 2020-03-16 DIAGNOSIS — E871 Hypo-osmolality and hyponatremia: Secondary | ICD-10-CM | POA: Diagnosis not present

## 2020-03-16 DIAGNOSIS — N183 Chronic kidney disease, stage 3 unspecified: Secondary | ICD-10-CM | POA: Diagnosis not present

## 2020-03-16 DIAGNOSIS — R627 Adult failure to thrive: Secondary | ICD-10-CM | POA: Diagnosis not present

## 2020-03-16 DIAGNOSIS — D649 Anemia, unspecified: Secondary | ICD-10-CM | POA: Diagnosis not present

## 2020-03-21 DIAGNOSIS — Z950 Presence of cardiac pacemaker: Secondary | ICD-10-CM | POA: Diagnosis not present

## 2020-03-21 DIAGNOSIS — I480 Paroxysmal atrial fibrillation: Secondary | ICD-10-CM | POA: Diagnosis not present

## 2020-03-21 DIAGNOSIS — R627 Adult failure to thrive: Secondary | ICD-10-CM | POA: Diagnosis not present

## 2020-03-21 DIAGNOSIS — N183 Chronic kidney disease, stage 3 unspecified: Secondary | ICD-10-CM | POA: Diagnosis not present

## 2020-03-21 DIAGNOSIS — R911 Solitary pulmonary nodule: Secondary | ICD-10-CM | POA: Diagnosis not present

## 2020-03-21 DIAGNOSIS — R011 Cardiac murmur, unspecified: Secondary | ICD-10-CM | POA: Diagnosis not present

## 2020-03-21 DIAGNOSIS — D649 Anemia, unspecified: Secondary | ICD-10-CM | POA: Diagnosis not present

## 2020-03-21 DIAGNOSIS — I5032 Chronic diastolic (congestive) heart failure: Secondary | ICD-10-CM | POA: Diagnosis not present

## 2020-03-21 DIAGNOSIS — E871 Hypo-osmolality and hyponatremia: Secondary | ICD-10-CM | POA: Diagnosis not present

## 2020-03-21 DIAGNOSIS — I340 Nonrheumatic mitral (valve) insufficiency: Secondary | ICD-10-CM | POA: Diagnosis not present

## 2020-03-22 ENCOUNTER — Ambulatory Visit (INDEPENDENT_AMBULATORY_CARE_PROVIDER_SITE_OTHER): Payer: Medicare HMO

## 2020-03-22 DIAGNOSIS — D649 Anemia, unspecified: Secondary | ICD-10-CM | POA: Diagnosis not present

## 2020-03-22 DIAGNOSIS — I495 Sick sinus syndrome: Secondary | ICD-10-CM

## 2020-03-22 DIAGNOSIS — R911 Solitary pulmonary nodule: Secondary | ICD-10-CM | POA: Diagnosis not present

## 2020-03-22 DIAGNOSIS — E871 Hypo-osmolality and hyponatremia: Secondary | ICD-10-CM | POA: Diagnosis not present

## 2020-03-22 DIAGNOSIS — R011 Cardiac murmur, unspecified: Secondary | ICD-10-CM | POA: Diagnosis not present

## 2020-03-22 DIAGNOSIS — N183 Chronic kidney disease, stage 3 unspecified: Secondary | ICD-10-CM | POA: Diagnosis not present

## 2020-03-22 DIAGNOSIS — I340 Nonrheumatic mitral (valve) insufficiency: Secondary | ICD-10-CM | POA: Diagnosis not present

## 2020-03-22 DIAGNOSIS — R627 Adult failure to thrive: Secondary | ICD-10-CM | POA: Diagnosis not present

## 2020-03-22 DIAGNOSIS — I480 Paroxysmal atrial fibrillation: Secondary | ICD-10-CM | POA: Diagnosis not present

## 2020-03-22 DIAGNOSIS — I5032 Chronic diastolic (congestive) heart failure: Secondary | ICD-10-CM | POA: Diagnosis not present

## 2020-03-22 DIAGNOSIS — Z950 Presence of cardiac pacemaker: Secondary | ICD-10-CM | POA: Diagnosis not present

## 2020-03-23 DIAGNOSIS — E871 Hypo-osmolality and hyponatremia: Secondary | ICD-10-CM | POA: Diagnosis not present

## 2020-03-23 DIAGNOSIS — I480 Paroxysmal atrial fibrillation: Secondary | ICD-10-CM | POA: Diagnosis not present

## 2020-03-23 DIAGNOSIS — R011 Cardiac murmur, unspecified: Secondary | ICD-10-CM | POA: Diagnosis not present

## 2020-03-23 DIAGNOSIS — N183 Chronic kidney disease, stage 3 unspecified: Secondary | ICD-10-CM | POA: Diagnosis not present

## 2020-03-23 DIAGNOSIS — D649 Anemia, unspecified: Secondary | ICD-10-CM | POA: Diagnosis not present

## 2020-03-23 DIAGNOSIS — R911 Solitary pulmonary nodule: Secondary | ICD-10-CM | POA: Diagnosis not present

## 2020-03-23 DIAGNOSIS — I340 Nonrheumatic mitral (valve) insufficiency: Secondary | ICD-10-CM | POA: Diagnosis not present

## 2020-03-23 DIAGNOSIS — Z950 Presence of cardiac pacemaker: Secondary | ICD-10-CM | POA: Diagnosis not present

## 2020-03-23 DIAGNOSIS — R627 Adult failure to thrive: Secondary | ICD-10-CM | POA: Diagnosis not present

## 2020-03-23 DIAGNOSIS — I5032 Chronic diastolic (congestive) heart failure: Secondary | ICD-10-CM | POA: Diagnosis not present

## 2020-03-28 DIAGNOSIS — N183 Chronic kidney disease, stage 3 unspecified: Secondary | ICD-10-CM | POA: Diagnosis not present

## 2020-03-28 DIAGNOSIS — R911 Solitary pulmonary nodule: Secondary | ICD-10-CM | POA: Diagnosis not present

## 2020-03-28 DIAGNOSIS — I340 Nonrheumatic mitral (valve) insufficiency: Secondary | ICD-10-CM | POA: Diagnosis not present

## 2020-03-28 DIAGNOSIS — I480 Paroxysmal atrial fibrillation: Secondary | ICD-10-CM | POA: Diagnosis not present

## 2020-03-28 DIAGNOSIS — R627 Adult failure to thrive: Secondary | ICD-10-CM | POA: Diagnosis not present

## 2020-03-28 DIAGNOSIS — D649 Anemia, unspecified: Secondary | ICD-10-CM | POA: Diagnosis not present

## 2020-03-28 DIAGNOSIS — I5032 Chronic diastolic (congestive) heart failure: Secondary | ICD-10-CM | POA: Diagnosis not present

## 2020-03-28 DIAGNOSIS — Z950 Presence of cardiac pacemaker: Secondary | ICD-10-CM | POA: Diagnosis not present

## 2020-03-28 DIAGNOSIS — R011 Cardiac murmur, unspecified: Secondary | ICD-10-CM | POA: Diagnosis not present

## 2020-03-28 DIAGNOSIS — E871 Hypo-osmolality and hyponatremia: Secondary | ICD-10-CM | POA: Diagnosis not present

## 2020-03-28 LAB — CUP PACEART REMOTE DEVICE CHECK
Battery Impedance: 503 Ohm
Battery Remaining Longevity: 91 mo
Battery Voltage: 2.79 V
Brady Statistic RV Percent Paced: 97 %
Date Time Interrogation Session: 20220314171118
Implantable Lead Implant Date: 20150507
Implantable Lead Implant Date: 20150507
Implantable Lead Location: 753859
Implantable Lead Location: 753860
Implantable Lead Model: 5092
Implantable Lead Model: 5592
Implantable Pulse Generator Implant Date: 20150507
Lead Channel Impedance Value: 535 Ohm
Lead Channel Impedance Value: 67 Ohm
Lead Channel Pacing Threshold Amplitude: 0.625 V
Lead Channel Pacing Threshold Pulse Width: 0.4 ms
Lead Channel Setting Pacing Amplitude: 2.5 V
Lead Channel Setting Pacing Pulse Width: 0.4 ms
Lead Channel Setting Sensing Sensitivity: 2 mV

## 2020-03-29 DIAGNOSIS — I480 Paroxysmal atrial fibrillation: Secondary | ICD-10-CM | POA: Diagnosis not present

## 2020-03-29 DIAGNOSIS — R011 Cardiac murmur, unspecified: Secondary | ICD-10-CM | POA: Diagnosis not present

## 2020-03-29 DIAGNOSIS — I5032 Chronic diastolic (congestive) heart failure: Secondary | ICD-10-CM | POA: Diagnosis not present

## 2020-03-29 DIAGNOSIS — R911 Solitary pulmonary nodule: Secondary | ICD-10-CM | POA: Diagnosis not present

## 2020-03-29 DIAGNOSIS — N183 Chronic kidney disease, stage 3 unspecified: Secondary | ICD-10-CM | POA: Diagnosis not present

## 2020-03-29 DIAGNOSIS — Z950 Presence of cardiac pacemaker: Secondary | ICD-10-CM | POA: Diagnosis not present

## 2020-03-29 DIAGNOSIS — E871 Hypo-osmolality and hyponatremia: Secondary | ICD-10-CM | POA: Diagnosis not present

## 2020-03-29 DIAGNOSIS — R627 Adult failure to thrive: Secondary | ICD-10-CM | POA: Diagnosis not present

## 2020-03-29 DIAGNOSIS — I340 Nonrheumatic mitral (valve) insufficiency: Secondary | ICD-10-CM | POA: Diagnosis not present

## 2020-03-29 DIAGNOSIS — D649 Anemia, unspecified: Secondary | ICD-10-CM | POA: Diagnosis not present

## 2020-03-30 DIAGNOSIS — R911 Solitary pulmonary nodule: Secondary | ICD-10-CM | POA: Diagnosis not present

## 2020-03-30 DIAGNOSIS — I5032 Chronic diastolic (congestive) heart failure: Secondary | ICD-10-CM | POA: Diagnosis not present

## 2020-03-30 DIAGNOSIS — Z950 Presence of cardiac pacemaker: Secondary | ICD-10-CM | POA: Diagnosis not present

## 2020-03-30 DIAGNOSIS — I480 Paroxysmal atrial fibrillation: Secondary | ICD-10-CM | POA: Diagnosis not present

## 2020-03-30 DIAGNOSIS — R011 Cardiac murmur, unspecified: Secondary | ICD-10-CM | POA: Diagnosis not present

## 2020-03-30 DIAGNOSIS — R627 Adult failure to thrive: Secondary | ICD-10-CM | POA: Diagnosis not present

## 2020-03-30 DIAGNOSIS — N183 Chronic kidney disease, stage 3 unspecified: Secondary | ICD-10-CM | POA: Diagnosis not present

## 2020-03-30 DIAGNOSIS — D649 Anemia, unspecified: Secondary | ICD-10-CM | POA: Diagnosis not present

## 2020-03-30 DIAGNOSIS — I340 Nonrheumatic mitral (valve) insufficiency: Secondary | ICD-10-CM | POA: Diagnosis not present

## 2020-03-30 DIAGNOSIS — E871 Hypo-osmolality and hyponatremia: Secondary | ICD-10-CM | POA: Diagnosis not present

## 2020-03-30 NOTE — Progress Notes (Signed)
Remote pacemaker transmission.   

## 2020-03-31 DIAGNOSIS — R627 Adult failure to thrive: Secondary | ICD-10-CM | POA: Diagnosis not present

## 2020-03-31 DIAGNOSIS — D649 Anemia, unspecified: Secondary | ICD-10-CM | POA: Diagnosis not present

## 2020-03-31 DIAGNOSIS — R011 Cardiac murmur, unspecified: Secondary | ICD-10-CM | POA: Diagnosis not present

## 2020-03-31 DIAGNOSIS — Z950 Presence of cardiac pacemaker: Secondary | ICD-10-CM | POA: Diagnosis not present

## 2020-03-31 DIAGNOSIS — N183 Chronic kidney disease, stage 3 unspecified: Secondary | ICD-10-CM | POA: Diagnosis not present

## 2020-03-31 DIAGNOSIS — I480 Paroxysmal atrial fibrillation: Secondary | ICD-10-CM | POA: Diagnosis not present

## 2020-03-31 DIAGNOSIS — E871 Hypo-osmolality and hyponatremia: Secondary | ICD-10-CM | POA: Diagnosis not present

## 2020-03-31 DIAGNOSIS — I5032 Chronic diastolic (congestive) heart failure: Secondary | ICD-10-CM | POA: Diagnosis not present

## 2020-03-31 DIAGNOSIS — R911 Solitary pulmonary nodule: Secondary | ICD-10-CM | POA: Diagnosis not present

## 2020-03-31 DIAGNOSIS — I340 Nonrheumatic mitral (valve) insufficiency: Secondary | ICD-10-CM | POA: Diagnosis not present

## 2020-04-03 DIAGNOSIS — R627 Adult failure to thrive: Secondary | ICD-10-CM | POA: Diagnosis not present

## 2020-04-03 DIAGNOSIS — I340 Nonrheumatic mitral (valve) insufficiency: Secondary | ICD-10-CM | POA: Diagnosis not present

## 2020-04-03 DIAGNOSIS — I5032 Chronic diastolic (congestive) heart failure: Secondary | ICD-10-CM | POA: Diagnosis not present

## 2020-04-03 DIAGNOSIS — Z950 Presence of cardiac pacemaker: Secondary | ICD-10-CM | POA: Diagnosis not present

## 2020-04-03 DIAGNOSIS — D649 Anemia, unspecified: Secondary | ICD-10-CM | POA: Diagnosis not present

## 2020-04-03 DIAGNOSIS — R011 Cardiac murmur, unspecified: Secondary | ICD-10-CM | POA: Diagnosis not present

## 2020-04-03 DIAGNOSIS — E871 Hypo-osmolality and hyponatremia: Secondary | ICD-10-CM | POA: Diagnosis not present

## 2020-04-03 DIAGNOSIS — R911 Solitary pulmonary nodule: Secondary | ICD-10-CM | POA: Diagnosis not present

## 2020-04-03 DIAGNOSIS — I5033 Acute on chronic diastolic (congestive) heart failure: Secondary | ICD-10-CM | POA: Diagnosis not present

## 2020-04-03 DIAGNOSIS — J810 Acute pulmonary edema: Secondary | ICD-10-CM | POA: Diagnosis not present

## 2020-04-03 DIAGNOSIS — N183 Chronic kidney disease, stage 3 unspecified: Secondary | ICD-10-CM | POA: Diagnosis not present

## 2020-04-03 DIAGNOSIS — R0600 Dyspnea, unspecified: Secondary | ICD-10-CM | POA: Diagnosis not present

## 2020-04-03 DIAGNOSIS — I480 Paroxysmal atrial fibrillation: Secondary | ICD-10-CM | POA: Diagnosis not present

## 2020-04-03 DIAGNOSIS — R918 Other nonspecific abnormal finding of lung field: Secondary | ICD-10-CM | POA: Diagnosis not present

## 2020-04-04 DIAGNOSIS — R0600 Dyspnea, unspecified: Secondary | ICD-10-CM | POA: Diagnosis not present

## 2020-04-04 DIAGNOSIS — E039 Hypothyroidism, unspecified: Secondary | ICD-10-CM | POA: Diagnosis not present

## 2020-04-04 DIAGNOSIS — R0602 Shortness of breath: Secondary | ICD-10-CM | POA: Diagnosis not present

## 2020-04-04 DIAGNOSIS — E871 Hypo-osmolality and hyponatremia: Secondary | ICD-10-CM | POA: Diagnosis not present

## 2020-04-04 DIAGNOSIS — E43 Unspecified severe protein-calorie malnutrition: Secondary | ICD-10-CM | POA: Diagnosis not present

## 2020-04-04 DIAGNOSIS — Z950 Presence of cardiac pacemaker: Secondary | ICD-10-CM | POA: Diagnosis not present

## 2020-04-04 DIAGNOSIS — R413 Other amnesia: Secondary | ICD-10-CM | POA: Diagnosis not present

## 2020-04-05 DIAGNOSIS — Z20822 Contact with and (suspected) exposure to covid-19: Secondary | ICD-10-CM | POA: Diagnosis not present

## 2020-04-05 DIAGNOSIS — E43 Unspecified severe protein-calorie malnutrition: Secondary | ICD-10-CM | POA: Diagnosis not present

## 2020-04-05 DIAGNOSIS — Z66 Do not resuscitate: Secondary | ICD-10-CM | POA: Diagnosis not present

## 2020-04-05 DIAGNOSIS — E871 Hypo-osmolality and hyponatremia: Secondary | ICD-10-CM | POA: Diagnosis not present

## 2020-04-05 DIAGNOSIS — I509 Heart failure, unspecified: Secondary | ICD-10-CM | POA: Diagnosis not present

## 2020-04-05 DIAGNOSIS — Z681 Body mass index (BMI) 19 or less, adult: Secondary | ICD-10-CM | POA: Diagnosis not present

## 2020-04-05 DIAGNOSIS — D509 Iron deficiency anemia, unspecified: Secondary | ICD-10-CM | POA: Diagnosis not present

## 2020-04-05 DIAGNOSIS — N179 Acute kidney failure, unspecified: Secondary | ICD-10-CM | POA: Diagnosis not present

## 2020-04-05 DIAGNOSIS — J9691 Respiratory failure, unspecified with hypoxia: Secondary | ICD-10-CM | POA: Diagnosis not present

## 2020-04-05 DIAGNOSIS — N189 Chronic kidney disease, unspecified: Secondary | ICD-10-CM | POA: Diagnosis not present

## 2020-04-05 DIAGNOSIS — Z950 Presence of cardiac pacemaker: Secondary | ICD-10-CM | POA: Diagnosis not present

## 2020-04-05 DIAGNOSIS — R0600 Dyspnea, unspecified: Secondary | ICD-10-CM | POA: Diagnosis not present

## 2020-04-05 DIAGNOSIS — Z952 Presence of prosthetic heart valve: Secondary | ICD-10-CM | POA: Diagnosis not present

## 2020-04-05 DIAGNOSIS — D649 Anemia, unspecified: Secondary | ICD-10-CM | POA: Diagnosis not present

## 2020-04-05 DIAGNOSIS — I5033 Acute on chronic diastolic (congestive) heart failure: Secondary | ICD-10-CM | POA: Diagnosis not present

## 2020-04-05 DIAGNOSIS — R5381 Other malaise: Secondary | ICD-10-CM | POA: Diagnosis not present

## 2020-04-05 DIAGNOSIS — E039 Hypothyroidism, unspecified: Secondary | ICD-10-CM | POA: Diagnosis not present

## 2020-04-05 DIAGNOSIS — I130 Hypertensive heart and chronic kidney disease with heart failure and stage 1 through stage 4 chronic kidney disease, or unspecified chronic kidney disease: Secondary | ICD-10-CM | POA: Diagnosis not present

## 2020-04-05 DIAGNOSIS — I482 Chronic atrial fibrillation, unspecified: Secondary | ICD-10-CM | POA: Diagnosis not present

## 2020-04-07 DIAGNOSIS — Z743 Need for continuous supervision: Secondary | ICD-10-CM | POA: Diagnosis not present

## 2020-04-07 DIAGNOSIS — N189 Chronic kidney disease, unspecified: Secondary | ICD-10-CM | POA: Diagnosis not present

## 2020-04-07 DIAGNOSIS — E039 Hypothyroidism, unspecified: Secondary | ICD-10-CM | POA: Diagnosis not present

## 2020-04-07 DIAGNOSIS — N179 Acute kidney failure, unspecified: Secondary | ICD-10-CM | POA: Diagnosis not present

## 2020-04-07 DIAGNOSIS — Z7901 Long term (current) use of anticoagulants: Secondary | ICD-10-CM | POA: Diagnosis not present

## 2020-04-07 DIAGNOSIS — I5033 Acute on chronic diastolic (congestive) heart failure: Secondary | ICD-10-CM | POA: Diagnosis not present

## 2020-04-07 DIAGNOSIS — E43 Unspecified severe protein-calorie malnutrition: Secondary | ICD-10-CM | POA: Diagnosis not present

## 2020-04-07 DIAGNOSIS — I482 Chronic atrial fibrillation, unspecified: Secondary | ICD-10-CM | POA: Diagnosis not present

## 2020-04-07 DIAGNOSIS — E871 Hypo-osmolality and hyponatremia: Secondary | ICD-10-CM | POA: Diagnosis not present

## 2020-04-07 DIAGNOSIS — R4181 Age-related cognitive decline: Secondary | ICD-10-CM | POA: Diagnosis not present

## 2020-04-07 DIAGNOSIS — R69 Illness, unspecified: Secondary | ICD-10-CM | POA: Diagnosis not present

## 2020-04-07 DIAGNOSIS — R531 Weakness: Secondary | ICD-10-CM | POA: Diagnosis not present

## 2020-04-10 DIAGNOSIS — A280 Pasteurellosis: Secondary | ICD-10-CM | POA: Diagnosis not present

## 2020-04-10 DIAGNOSIS — E440 Moderate protein-calorie malnutrition: Secondary | ICD-10-CM | POA: Diagnosis not present

## 2020-04-10 DIAGNOSIS — Z950 Presence of cardiac pacemaker: Secondary | ICD-10-CM | POA: Diagnosis not present

## 2020-04-10 DIAGNOSIS — I501 Left ventricular failure: Secondary | ICD-10-CM | POA: Diagnosis not present

## 2020-04-10 DIAGNOSIS — I480 Paroxysmal atrial fibrillation: Secondary | ICD-10-CM | POA: Diagnosis not present

## 2020-04-10 DIAGNOSIS — N390 Urinary tract infection, site not specified: Secondary | ICD-10-CM | POA: Diagnosis not present

## 2020-04-10 DIAGNOSIS — Z7901 Long term (current) use of anticoagulants: Secondary | ICD-10-CM | POA: Diagnosis not present

## 2020-04-10 DIAGNOSIS — D72829 Elevated white blood cell count, unspecified: Secondary | ICD-10-CM | POA: Diagnosis not present

## 2020-04-10 DIAGNOSIS — R059 Cough, unspecified: Secondary | ICD-10-CM | POA: Diagnosis not present

## 2020-04-10 DIAGNOSIS — I340 Nonrheumatic mitral (valve) insufficiency: Secondary | ICD-10-CM | POA: Diagnosis not present

## 2020-04-10 DIAGNOSIS — Z5181 Encounter for therapeutic drug level monitoring: Secondary | ICD-10-CM | POA: Diagnosis not present

## 2020-04-10 DIAGNOSIS — F0280 Dementia in other diseases classified elsewhere without behavioral disturbance: Secondary | ICD-10-CM | POA: Diagnosis not present

## 2020-04-10 DIAGNOSIS — I495 Sick sinus syndrome: Secondary | ICD-10-CM | POA: Diagnosis not present

## 2020-04-10 DIAGNOSIS — R69 Illness, unspecified: Secondary | ICD-10-CM | POA: Diagnosis not present

## 2020-04-10 DIAGNOSIS — I10 Essential (primary) hypertension: Secondary | ICD-10-CM | POA: Diagnosis not present

## 2020-04-10 DIAGNOSIS — R4182 Altered mental status, unspecified: Secondary | ICD-10-CM | POA: Diagnosis not present

## 2020-04-10 DIAGNOSIS — E871 Hypo-osmolality and hyponatremia: Secondary | ICD-10-CM | POA: Diagnosis not present

## 2020-04-10 DIAGNOSIS — N183 Chronic kidney disease, stage 3 unspecified: Secondary | ICD-10-CM | POA: Diagnosis not present

## 2020-04-10 DIAGNOSIS — R7989 Other specified abnormal findings of blood chemistry: Secondary | ICD-10-CM | POA: Diagnosis not present

## 2020-04-10 DIAGNOSIS — E43 Unspecified severe protein-calorie malnutrition: Secondary | ICD-10-CM | POA: Diagnosis not present

## 2020-04-10 DIAGNOSIS — I130 Hypertensive heart and chronic kidney disease with heart failure and stage 1 through stage 4 chronic kidney disease, or unspecified chronic kidney disease: Secondary | ICD-10-CM | POA: Diagnosis not present

## 2020-04-10 DIAGNOSIS — R410 Disorientation, unspecified: Secondary | ICD-10-CM | POA: Diagnosis not present

## 2020-04-10 DIAGNOSIS — I5042 Chronic combined systolic (congestive) and diastolic (congestive) heart failure: Secondary | ICD-10-CM | POA: Diagnosis not present

## 2020-04-10 DIAGNOSIS — J188 Other pneumonia, unspecified organism: Secondary | ICD-10-CM | POA: Diagnosis not present

## 2020-04-10 DIAGNOSIS — Z954 Presence of other heart-valve replacement: Secondary | ICD-10-CM | POA: Diagnosis not present

## 2020-04-10 DIAGNOSIS — I5043 Acute on chronic combined systolic (congestive) and diastolic (congestive) heart failure: Secondary | ICD-10-CM | POA: Diagnosis not present

## 2020-04-12 DIAGNOSIS — R69 Illness, unspecified: Secondary | ICD-10-CM | POA: Diagnosis not present

## 2020-04-12 DIAGNOSIS — I5042 Chronic combined systolic (congestive) and diastolic (congestive) heart failure: Secondary | ICD-10-CM | POA: Diagnosis not present

## 2020-04-13 DIAGNOSIS — Z5181 Encounter for therapeutic drug level monitoring: Secondary | ICD-10-CM | POA: Diagnosis not present

## 2020-04-13 DIAGNOSIS — R410 Disorientation, unspecified: Secondary | ICD-10-CM | POA: Diagnosis not present

## 2020-04-20 DIAGNOSIS — I5042 Chronic combined systolic (congestive) and diastolic (congestive) heart failure: Secondary | ICD-10-CM | POA: Diagnosis not present

## 2020-04-20 DIAGNOSIS — E871 Hypo-osmolality and hyponatremia: Secondary | ICD-10-CM | POA: Diagnosis not present

## 2020-04-24 DIAGNOSIS — I10 Essential (primary) hypertension: Secondary | ICD-10-CM | POA: Diagnosis not present

## 2020-05-01 DIAGNOSIS — R4182 Altered mental status, unspecified: Secondary | ICD-10-CM | POA: Diagnosis not present

## 2020-05-02 DIAGNOSIS — Z5181 Encounter for therapeutic drug level monitoring: Secondary | ICD-10-CM | POA: Diagnosis not present

## 2020-05-03 DIAGNOSIS — J188 Other pneumonia, unspecified organism: Secondary | ICD-10-CM | POA: Diagnosis not present

## 2020-05-04 DIAGNOSIS — J188 Other pneumonia, unspecified organism: Secondary | ICD-10-CM | POA: Diagnosis not present

## 2020-05-04 DIAGNOSIS — I5042 Chronic combined systolic (congestive) and diastolic (congestive) heart failure: Secondary | ICD-10-CM | POA: Diagnosis not present

## 2020-05-08 DIAGNOSIS — I5042 Chronic combined systolic (congestive) and diastolic (congestive) heart failure: Secondary | ICD-10-CM | POA: Diagnosis not present

## 2020-05-08 DIAGNOSIS — R69 Illness, unspecified: Secondary | ICD-10-CM | POA: Diagnosis not present

## 2020-05-08 DIAGNOSIS — I495 Sick sinus syndrome: Secondary | ICD-10-CM | POA: Diagnosis not present

## 2020-05-08 DIAGNOSIS — E440 Moderate protein-calorie malnutrition: Secondary | ICD-10-CM | POA: Diagnosis not present

## 2020-05-08 DIAGNOSIS — I480 Paroxysmal atrial fibrillation: Secondary | ICD-10-CM | POA: Diagnosis not present

## 2020-05-08 DIAGNOSIS — F0280 Dementia in other diseases classified elsewhere without behavioral disturbance: Secondary | ICD-10-CM | POA: Diagnosis not present

## 2020-05-17 DIAGNOSIS — R69 Illness, unspecified: Secondary | ICD-10-CM | POA: Diagnosis not present

## 2020-05-17 DIAGNOSIS — D72829 Elevated white blood cell count, unspecified: Secondary | ICD-10-CM | POA: Diagnosis not present

## 2020-05-17 DIAGNOSIS — E43 Unspecified severe protein-calorie malnutrition: Secondary | ICD-10-CM | POA: Diagnosis not present

## 2020-05-18 DIAGNOSIS — R7989 Other specified abnormal findings of blood chemistry: Secondary | ICD-10-CM | POA: Diagnosis not present

## 2020-05-18 DIAGNOSIS — N390 Urinary tract infection, site not specified: Secondary | ICD-10-CM | POA: Diagnosis not present

## 2020-05-19 DIAGNOSIS — D72829 Elevated white blood cell count, unspecified: Secondary | ICD-10-CM | POA: Diagnosis not present

## 2020-06-27 DIAGNOSIS — M7989 Other specified soft tissue disorders: Secondary | ICD-10-CM | POA: Diagnosis not present

## 2020-06-27 DIAGNOSIS — R600 Localized edema: Secondary | ICD-10-CM | POA: Diagnosis not present

## 2020-06-27 DIAGNOSIS — R0600 Dyspnea, unspecified: Secondary | ICD-10-CM | POA: Diagnosis not present

## 2020-06-27 DIAGNOSIS — I5033 Acute on chronic diastolic (congestive) heart failure: Secondary | ICD-10-CM | POA: Diagnosis not present

## 2020-06-27 DIAGNOSIS — I5032 Chronic diastolic (congestive) heart failure: Secondary | ICD-10-CM | POA: Diagnosis not present

## 2020-07-05 DIAGNOSIS — I509 Heart failure, unspecified: Secondary | ICD-10-CM | POA: Diagnosis not present

## 2020-07-05 DIAGNOSIS — E43 Unspecified severe protein-calorie malnutrition: Secondary | ICD-10-CM | POA: Diagnosis not present

## 2020-07-05 DIAGNOSIS — I081 Rheumatic disorders of both mitral and tricuspid valves: Secondary | ICD-10-CM | POA: Diagnosis not present

## 2020-07-05 DIAGNOSIS — R54 Age-related physical debility: Secondary | ICD-10-CM | POA: Diagnosis not present

## 2020-07-05 DIAGNOSIS — I495 Sick sinus syndrome: Secondary | ICD-10-CM | POA: Diagnosis not present

## 2020-07-05 DIAGNOSIS — Z7409 Other reduced mobility: Secondary | ICD-10-CM | POA: Diagnosis not present

## 2020-07-05 DIAGNOSIS — E871 Hypo-osmolality and hyponatremia: Secondary | ICD-10-CM | POA: Diagnosis not present

## 2020-07-05 DIAGNOSIS — I5033 Acute on chronic diastolic (congestive) heart failure: Secondary | ICD-10-CM | POA: Diagnosis not present

## 2020-07-05 DIAGNOSIS — I4821 Permanent atrial fibrillation: Secondary | ICD-10-CM | POA: Diagnosis not present

## 2020-07-11 DIAGNOSIS — I5033 Acute on chronic diastolic (congestive) heart failure: Secondary | ICD-10-CM | POA: Diagnosis not present

## 2020-07-11 DIAGNOSIS — I5032 Chronic diastolic (congestive) heart failure: Secondary | ICD-10-CM | POA: Diagnosis not present

## 2020-07-11 DIAGNOSIS — I480 Paroxysmal atrial fibrillation: Secondary | ICD-10-CM | POA: Diagnosis not present

## 2020-07-14 DIAGNOSIS — I272 Pulmonary hypertension, unspecified: Secondary | ICD-10-CM | POA: Diagnosis not present

## 2020-07-14 DIAGNOSIS — I4821 Permanent atrial fibrillation: Secondary | ICD-10-CM | POA: Diagnosis not present

## 2020-07-14 DIAGNOSIS — I5033 Acute on chronic diastolic (congestive) heart failure: Secondary | ICD-10-CM | POA: Diagnosis not present

## 2020-07-14 DIAGNOSIS — I081 Rheumatic disorders of both mitral and tricuspid valves: Secondary | ICD-10-CM | POA: Diagnosis not present

## 2020-07-14 DIAGNOSIS — I5032 Chronic diastolic (congestive) heart failure: Secondary | ICD-10-CM | POA: Diagnosis not present

## 2020-07-14 DIAGNOSIS — D508 Other iron deficiency anemias: Secondary | ICD-10-CM | POA: Diagnosis not present

## 2020-07-14 DIAGNOSIS — I495 Sick sinus syndrome: Secondary | ICD-10-CM | POA: Diagnosis not present

## 2020-07-14 DIAGNOSIS — R627 Adult failure to thrive: Secondary | ICD-10-CM | POA: Diagnosis not present

## 2020-07-14 DIAGNOSIS — N1831 Chronic kidney disease, stage 3a: Secondary | ICD-10-CM | POA: Diagnosis not present

## 2020-07-14 DIAGNOSIS — Z7901 Long term (current) use of anticoagulants: Secondary | ICD-10-CM | POA: Diagnosis not present

## 2020-07-14 DIAGNOSIS — E43 Unspecified severe protein-calorie malnutrition: Secondary | ICD-10-CM | POA: Diagnosis not present

## 2020-07-17 IMAGING — CT CT ABD-PELV W/ CM
2 of 5 series · 13 of 46 positions shown, 15 images · IV contrast (omnipaque)
Comparison: CT 06/26/2016

CLINICAL DATA: Abdominal distension, fall 3 days prior, right hip
and lower back pain, sacrococcygeal tenderness as well.

EXAM:
CT ABDOMEN AND PELVIS WITH CONTRAST
TECHNIQUE: Multidetector CT imaging of the abdomen and pelvis was performed
using the standard protocol following bolus administration of
intravenous contrast.
CONTRAST:  75mL OMNIPAQUE IOHEXOL 300 MG/ML  SOLN

[Series 2: axial st · axial · 0.75mm/px · z∈[-471,-81]mm · 10 of 90 slices shown, 12 images]
[im 6/90  soft-tissue]
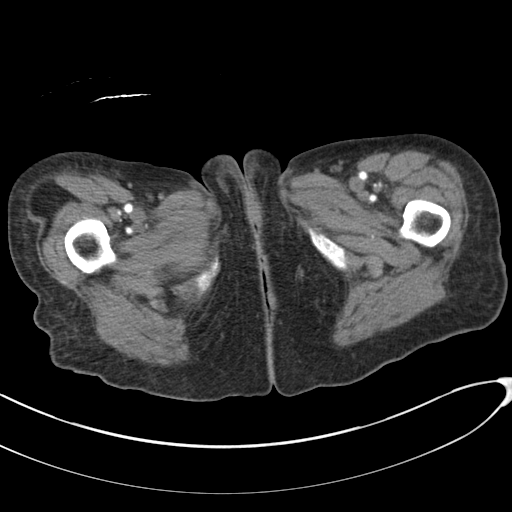
[im 6/90  bone]
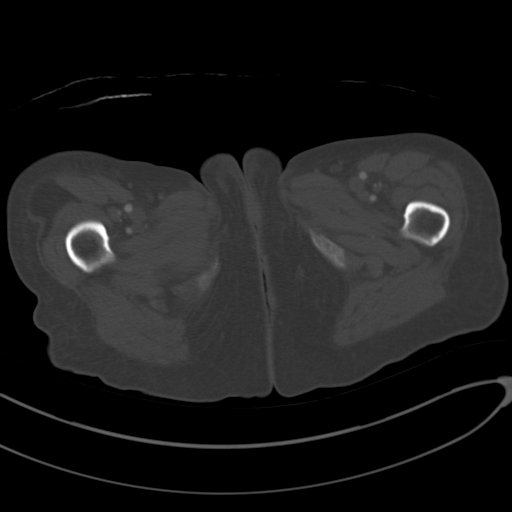
[im 18/90  soft-tissue]
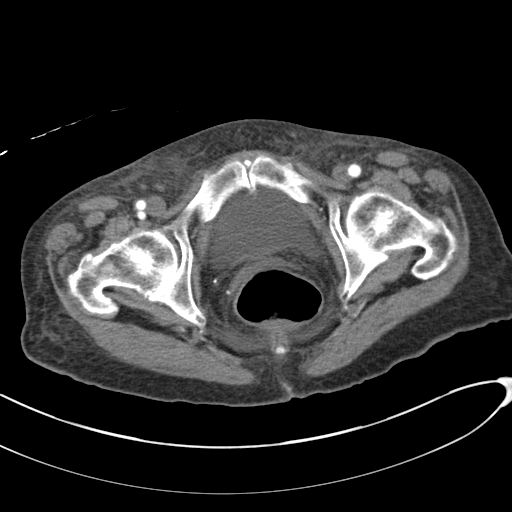
[im 24/90  soft-tissue]
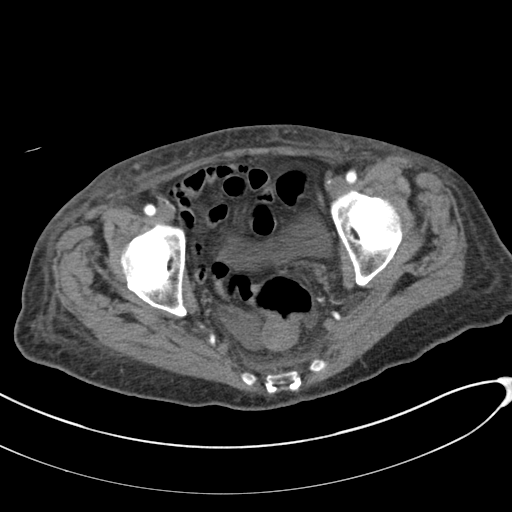
[im 30/90  soft-tissue]
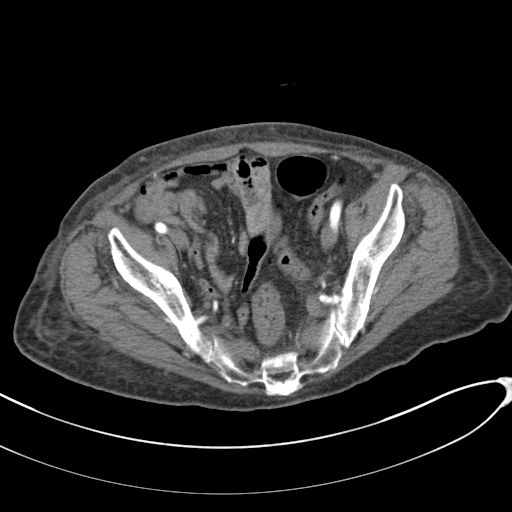
[im 42/90  soft-tissue]
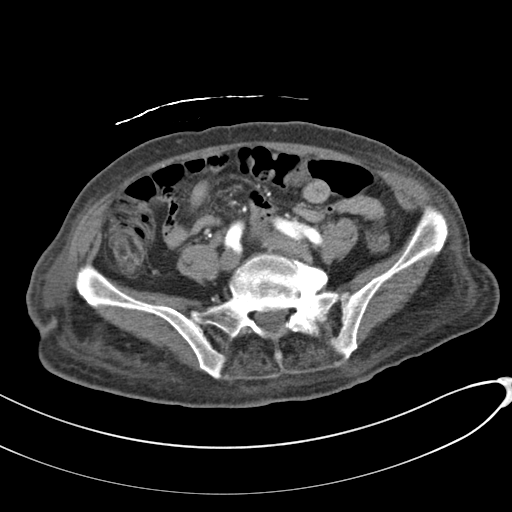
[im 48/90  soft-tissue]
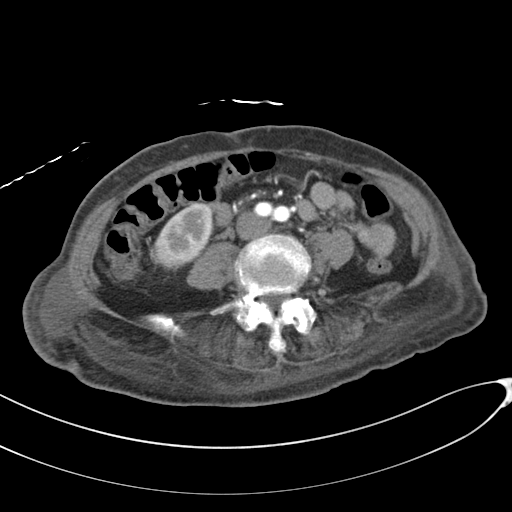
[im 60/90  soft-tissue]
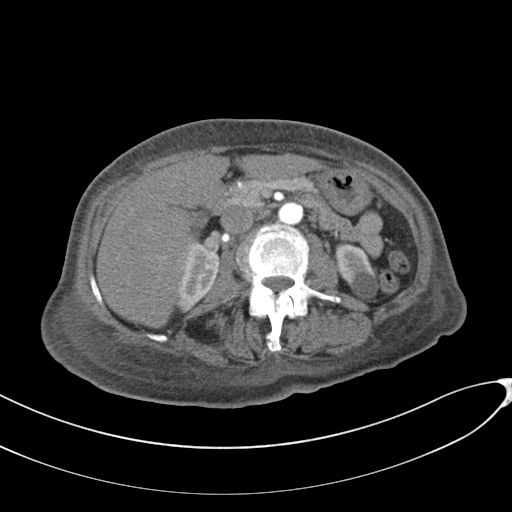
[im 66/90  soft-tissue]
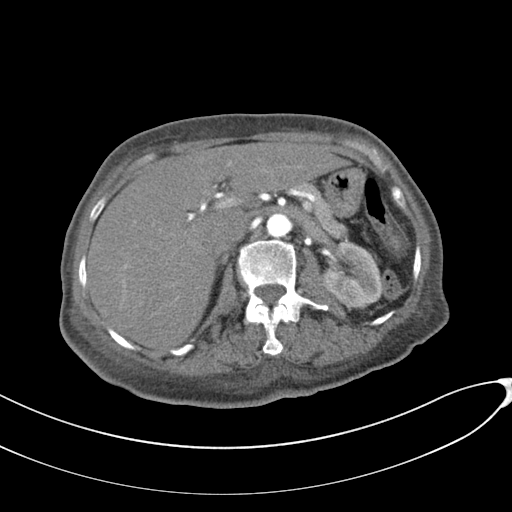
[im 72/90  soft-tissue]
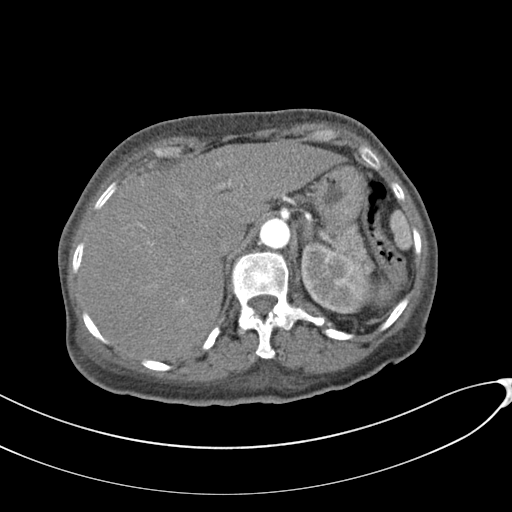
[im 72/90  bone]
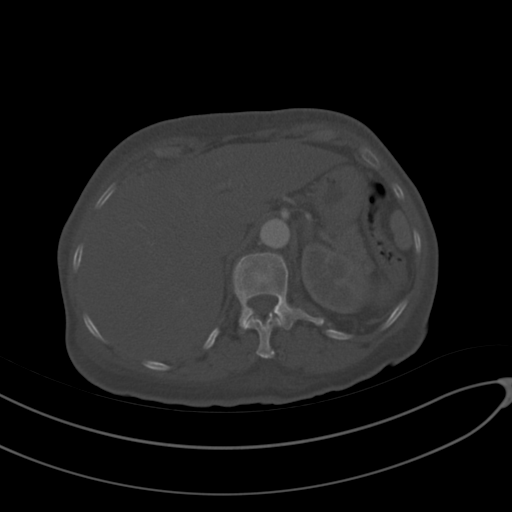
[im 84/90  soft-tissue]
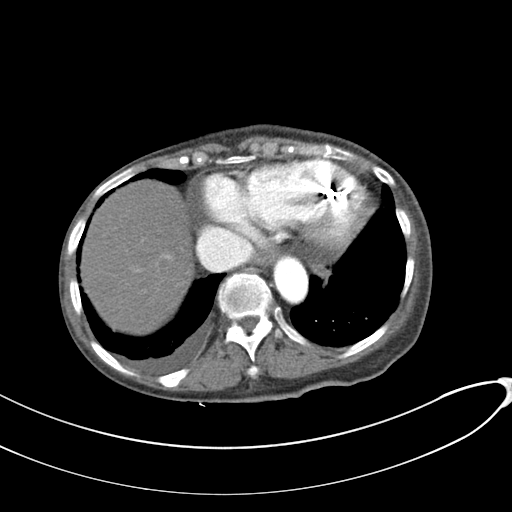

[Series 5: coronal st · coronal · 0.72mm/px · 3 of 120 slices shown]
[im 40/120  soft-tissue]
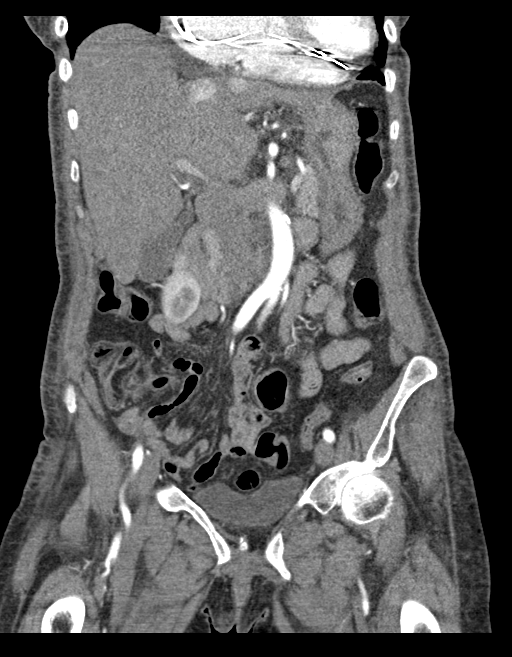
[im 53/120  soft-tissue]
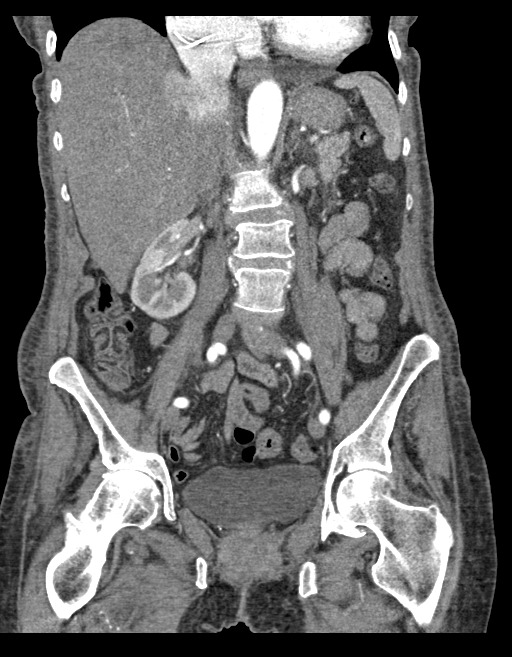
[im 67/120  soft-tissue]
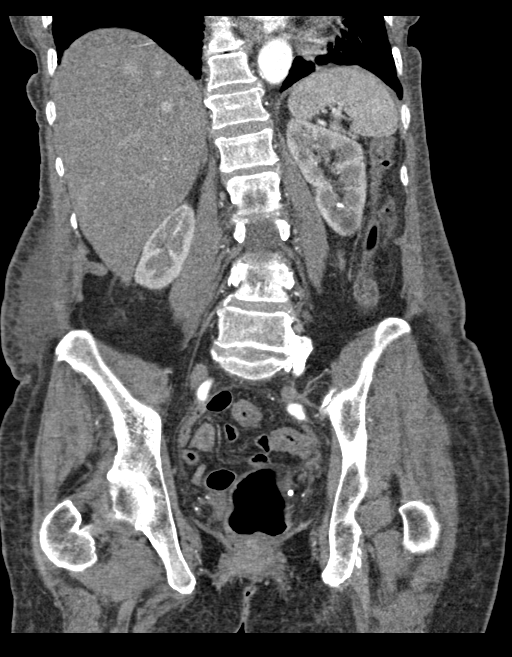

[13 of 46 positions shown; findings below may reference images not displayed]

FINDINGS: Lower chest: There is a small to moderate right pleural effusion
with adjacent atelectatic changes in the right lung base. More
bandlike areas of opacity in left lung base likely reflect
atelectasis and/or scarring. There is cardiomegaly with
predominantly right atrial enlargement with notable reflux of
contrast into the hepatic veins and IVC. Pacer leads are seen at the
cardiac apex and right atrium. No pericardial effusion.

Hepatobiliary: Slight heterogeneity of enhancement on delayed phase
imaging towards the periphery of the right lobe liver may reflect
some features of chronic hepatic congestion/altered perfusion. No
concerning focal liver lesion is seen. Smooth liver surface contour.
Gallbladder and biliary tree are unremarkable without visible
calcified gallstones.

Pancreas: Mild pancreatic atrophy. No focal concerning pancreatic
lesion. No ductal dilatation or peripancreatic inflammation.

Spleen: Normal in size without focal abnormality.

Adrenals/Urinary Tract: Normal adrenal glands. Kidneys enhance and
excrete symmetrically. Stable appearance of a 2.2 cm fluid
attenuation cyst in the posterior lower pole left kidney. A
nonobstructing 1.4 cm calculus is seen in the left lower pole as
well. No concerning renal lesion, obstructive urolithiasis or
hydronephrosis. Urinary bladder is largely decompressed at the time
of exam and therefore poorly evaluated by CT imaging. No gross
bladder abnormality.

Stomach/Bowel: Distal esophagus, stomach and duodenum are
unremarkable. No small bowel thickening or dilatation. The appendix
is surgically absent. Chronic intramural fat throughout the colon is
a nonspecific finding that can be seen both as sequela of prior
inflammatory bowel disease or obese body habitus. No significant
acute colonic wall thickening or dilatation. No pericolonic
stranding or inflammatory change.

Vascular/Lymphatic: Atherosclerotic calcifications throughout the
abdominal aorta and branch vessels. No aneurysm or ectasia. Reflux
of contrast into the IVC and hepatic veins as above. No enlarged
abdominopelvic lymph nodes.

Reproductive: Uterus is surgically absent. No concerning adnexal
lesions.

Other: Diffuse mild body wall edema as well as some central
mesenteric edematous change. No abdominopelvic free air. There is a
small volume of low-attenuation free fluid in the deep pelvis of
unclear etiology. No bowel containing hernias.

Musculoskeletal: There is new anterior wedging compression deformity
of the L1 vertebral body with approximately 35% height loss
anteriorly (AO spine A1). Additional acute anterior wedging
compression deformity of the L3 vertebral body with 15% height loss
anteriorly (AO spine A1). No other acute fracture or suspicious
osseous lesion is seen. No visible sacral insufficiency fractures.
The bones of the pelvis are intact and congruent with arthrosis at
the symphysis pubis likely reflecting sequela of prior osteitis
pubis. There is levocurvature of the lumbar spine with an apex at
the L3 level. Partial ankylosis along the left lateral aspect of the
L5-S1 disc space is unchanged from prior. Stable hemangiomata at L4.
IMPRESSION: 1. Acute anterior wedging compression deformity of the L1 vertebral
body with 35% height loss anteriorly (AO spine A1).
2. Acute anterior wedging compression deformity of the L3 vertebral
body with 15% height loss anteriorly (AO spine A1).
3. Features suggestive of anasarca including a small to moderate
right pleural effusion, diffuse body wall edema and central
mesenteric edematous changes. Small volume of low-attenuation fluid
in the deep pelvis is favored to be secondary to this volume
third-spacing/fluid overload rather than an occult bowel injury
though should correlate with serial abdominal exam findings.
4. No other acute traumatic findings in the abdomen or pelvis.
5. Cardiomegaly with predominantly right atrial enlargement with
notable reflux of contrast into the hepatic veins and IVC,
suggestive of underlying right heart failure. Heterogeneous
attenuation of the liver particularly in the right lobe may reflect
some underlying hepatic congestion/altered perfusion as well.
6. Nonobstructing left nephrolithiasis.
7. Chronic intramural fat throughout the colon is a nonspecific
finding that can be seen both as sequela of prior inflammatory bowel
disease or obese body habitus.
8. Aortic Atherosclerosis ([id number]).

## 2020-07-18 DIAGNOSIS — Z7901 Long term (current) use of anticoagulants: Secondary | ICD-10-CM | POA: Diagnosis not present

## 2020-07-18 DIAGNOSIS — I5033 Acute on chronic diastolic (congestive) heart failure: Secondary | ICD-10-CM | POA: Diagnosis not present

## 2020-07-18 DIAGNOSIS — I495 Sick sinus syndrome: Secondary | ICD-10-CM | POA: Diagnosis not present

## 2020-07-18 DIAGNOSIS — I081 Rheumatic disorders of both mitral and tricuspid valves: Secondary | ICD-10-CM | POA: Diagnosis not present

## 2020-07-18 DIAGNOSIS — R627 Adult failure to thrive: Secondary | ICD-10-CM | POA: Diagnosis not present

## 2020-07-18 DIAGNOSIS — D508 Other iron deficiency anemias: Secondary | ICD-10-CM | POA: Diagnosis not present

## 2020-07-18 DIAGNOSIS — N1831 Chronic kidney disease, stage 3a: Secondary | ICD-10-CM | POA: Diagnosis not present

## 2020-07-18 DIAGNOSIS — I4821 Permanent atrial fibrillation: Secondary | ICD-10-CM | POA: Diagnosis not present

## 2020-07-18 DIAGNOSIS — E43 Unspecified severe protein-calorie malnutrition: Secondary | ICD-10-CM | POA: Diagnosis not present

## 2020-07-18 DIAGNOSIS — I272 Pulmonary hypertension, unspecified: Secondary | ICD-10-CM | POA: Diagnosis not present

## 2020-07-20 DIAGNOSIS — Z7901 Long term (current) use of anticoagulants: Secondary | ICD-10-CM | POA: Diagnosis not present

## 2020-07-20 DIAGNOSIS — I5033 Acute on chronic diastolic (congestive) heart failure: Secondary | ICD-10-CM | POA: Diagnosis not present

## 2020-07-20 DIAGNOSIS — R627 Adult failure to thrive: Secondary | ICD-10-CM | POA: Diagnosis not present

## 2020-07-20 DIAGNOSIS — I4821 Permanent atrial fibrillation: Secondary | ICD-10-CM | POA: Diagnosis not present

## 2020-07-20 DIAGNOSIS — R410 Disorientation, unspecified: Secondary | ICD-10-CM | POA: Diagnosis not present

## 2020-07-20 DIAGNOSIS — I495 Sick sinus syndrome: Secondary | ICD-10-CM | POA: Diagnosis not present

## 2020-07-20 DIAGNOSIS — E43 Unspecified severe protein-calorie malnutrition: Secondary | ICD-10-CM | POA: Diagnosis not present

## 2020-07-20 DIAGNOSIS — I272 Pulmonary hypertension, unspecified: Secondary | ICD-10-CM | POA: Diagnosis not present

## 2020-07-20 DIAGNOSIS — D508 Other iron deficiency anemias: Secondary | ICD-10-CM | POA: Diagnosis not present

## 2020-07-20 DIAGNOSIS — N1831 Chronic kidney disease, stage 3a: Secondary | ICD-10-CM | POA: Diagnosis not present

## 2020-07-20 DIAGNOSIS — G9341 Metabolic encephalopathy: Secondary | ICD-10-CM | POA: Diagnosis not present

## 2020-07-20 DIAGNOSIS — I081 Rheumatic disorders of both mitral and tricuspid valves: Secondary | ICD-10-CM | POA: Diagnosis not present

## 2020-07-21 DIAGNOSIS — J984 Other disorders of lung: Secondary | ICD-10-CM | POA: Diagnosis not present

## 2020-07-21 DIAGNOSIS — I517 Cardiomegaly: Secondary | ICD-10-CM | POA: Diagnosis not present

## 2020-07-21 DIAGNOSIS — R4689 Other symptoms and signs involving appearance and behavior: Secondary | ICD-10-CM | POA: Diagnosis not present

## 2020-07-21 DIAGNOSIS — I5032 Chronic diastolic (congestive) heart failure: Secondary | ICD-10-CM | POA: Diagnosis not present

## 2020-07-21 DIAGNOSIS — J8410 Pulmonary fibrosis, unspecified: Secondary | ICD-10-CM | POA: Diagnosis not present

## 2020-07-21 DIAGNOSIS — G9341 Metabolic encephalopathy: Secondary | ICD-10-CM | POA: Diagnosis not present

## 2020-07-21 DIAGNOSIS — R059 Cough, unspecified: Secondary | ICD-10-CM | POA: Diagnosis not present

## 2020-07-21 DIAGNOSIS — R410 Disorientation, unspecified: Secondary | ICD-10-CM | POA: Diagnosis not present

## 2020-07-25 DIAGNOSIS — I495 Sick sinus syndrome: Secondary | ICD-10-CM | POA: Diagnosis not present

## 2020-07-25 DIAGNOSIS — D508 Other iron deficiency anemias: Secondary | ICD-10-CM | POA: Diagnosis not present

## 2020-07-25 DIAGNOSIS — Z7901 Long term (current) use of anticoagulants: Secondary | ICD-10-CM | POA: Diagnosis not present

## 2020-07-25 DIAGNOSIS — I5033 Acute on chronic diastolic (congestive) heart failure: Secondary | ICD-10-CM | POA: Diagnosis not present

## 2020-07-25 DIAGNOSIS — N1831 Chronic kidney disease, stage 3a: Secondary | ICD-10-CM | POA: Diagnosis not present

## 2020-07-25 DIAGNOSIS — I272 Pulmonary hypertension, unspecified: Secondary | ICD-10-CM | POA: Diagnosis not present

## 2020-07-25 DIAGNOSIS — E43 Unspecified severe protein-calorie malnutrition: Secondary | ICD-10-CM | POA: Diagnosis not present

## 2020-07-25 DIAGNOSIS — R627 Adult failure to thrive: Secondary | ICD-10-CM | POA: Diagnosis not present

## 2020-07-25 DIAGNOSIS — I4821 Permanent atrial fibrillation: Secondary | ICD-10-CM | POA: Diagnosis not present

## 2020-07-25 DIAGNOSIS — I081 Rheumatic disorders of both mitral and tricuspid valves: Secondary | ICD-10-CM | POA: Diagnosis not present

## 2020-07-27 DIAGNOSIS — I081 Rheumatic disorders of both mitral and tricuspid valves: Secondary | ICD-10-CM | POA: Diagnosis not present

## 2020-07-27 DIAGNOSIS — I272 Pulmonary hypertension, unspecified: Secondary | ICD-10-CM | POA: Diagnosis not present

## 2020-07-27 DIAGNOSIS — R627 Adult failure to thrive: Secondary | ICD-10-CM | POA: Diagnosis not present

## 2020-07-27 DIAGNOSIS — I495 Sick sinus syndrome: Secondary | ICD-10-CM | POA: Diagnosis not present

## 2020-07-27 DIAGNOSIS — E43 Unspecified severe protein-calorie malnutrition: Secondary | ICD-10-CM | POA: Diagnosis not present

## 2020-07-27 DIAGNOSIS — N1831 Chronic kidney disease, stage 3a: Secondary | ICD-10-CM | POA: Diagnosis not present

## 2020-07-27 DIAGNOSIS — I4821 Permanent atrial fibrillation: Secondary | ICD-10-CM | POA: Diagnosis not present

## 2020-07-27 DIAGNOSIS — I5033 Acute on chronic diastolic (congestive) heart failure: Secondary | ICD-10-CM | POA: Diagnosis not present

## 2020-07-27 DIAGNOSIS — D508 Other iron deficiency anemias: Secondary | ICD-10-CM | POA: Diagnosis not present

## 2020-07-27 DIAGNOSIS — Z7901 Long term (current) use of anticoagulants: Secondary | ICD-10-CM | POA: Diagnosis not present

## 2020-07-28 DIAGNOSIS — I4821 Permanent atrial fibrillation: Secondary | ICD-10-CM | POA: Diagnosis not present

## 2020-07-28 DIAGNOSIS — D508 Other iron deficiency anemias: Secondary | ICD-10-CM | POA: Diagnosis not present

## 2020-07-28 DIAGNOSIS — I5033 Acute on chronic diastolic (congestive) heart failure: Secondary | ICD-10-CM | POA: Diagnosis not present

## 2020-07-28 DIAGNOSIS — I495 Sick sinus syndrome: Secondary | ICD-10-CM | POA: Diagnosis not present

## 2020-07-28 DIAGNOSIS — E43 Unspecified severe protein-calorie malnutrition: Secondary | ICD-10-CM | POA: Diagnosis not present

## 2020-07-28 DIAGNOSIS — I081 Rheumatic disorders of both mitral and tricuspid valves: Secondary | ICD-10-CM | POA: Diagnosis not present

## 2020-07-28 DIAGNOSIS — Z7901 Long term (current) use of anticoagulants: Secondary | ICD-10-CM | POA: Diagnosis not present

## 2020-07-28 DIAGNOSIS — N1831 Chronic kidney disease, stage 3a: Secondary | ICD-10-CM | POA: Diagnosis not present

## 2020-07-28 DIAGNOSIS — R627 Adult failure to thrive: Secondary | ICD-10-CM | POA: Diagnosis not present

## 2020-07-28 DIAGNOSIS — I272 Pulmonary hypertension, unspecified: Secondary | ICD-10-CM | POA: Diagnosis not present

## 2020-07-31 DIAGNOSIS — D508 Other iron deficiency anemias: Secondary | ICD-10-CM | POA: Diagnosis not present

## 2020-07-31 DIAGNOSIS — E43 Unspecified severe protein-calorie malnutrition: Secondary | ICD-10-CM | POA: Diagnosis not present

## 2020-07-31 DIAGNOSIS — I4821 Permanent atrial fibrillation: Secondary | ICD-10-CM | POA: Diagnosis not present

## 2020-07-31 DIAGNOSIS — I272 Pulmonary hypertension, unspecified: Secondary | ICD-10-CM | POA: Diagnosis not present

## 2020-07-31 DIAGNOSIS — I081 Rheumatic disorders of both mitral and tricuspid valves: Secondary | ICD-10-CM | POA: Diagnosis not present

## 2020-07-31 DIAGNOSIS — I5033 Acute on chronic diastolic (congestive) heart failure: Secondary | ICD-10-CM | POA: Diagnosis not present

## 2020-07-31 DIAGNOSIS — N1831 Chronic kidney disease, stage 3a: Secondary | ICD-10-CM | POA: Diagnosis not present

## 2020-07-31 DIAGNOSIS — R627 Adult failure to thrive: Secondary | ICD-10-CM | POA: Diagnosis not present

## 2020-07-31 DIAGNOSIS — Z7901 Long term (current) use of anticoagulants: Secondary | ICD-10-CM | POA: Diagnosis not present

## 2020-07-31 DIAGNOSIS — I495 Sick sinus syndrome: Secondary | ICD-10-CM | POA: Diagnosis not present

## 2020-08-01 DIAGNOSIS — I5033 Acute on chronic diastolic (congestive) heart failure: Secondary | ICD-10-CM | POA: Diagnosis not present

## 2020-08-01 DIAGNOSIS — D508 Other iron deficiency anemias: Secondary | ICD-10-CM | POA: Diagnosis not present

## 2020-08-01 DIAGNOSIS — R627 Adult failure to thrive: Secondary | ICD-10-CM | POA: Diagnosis not present

## 2020-08-01 DIAGNOSIS — I272 Pulmonary hypertension, unspecified: Secondary | ICD-10-CM | POA: Diagnosis not present

## 2020-08-01 DIAGNOSIS — I495 Sick sinus syndrome: Secondary | ICD-10-CM | POA: Diagnosis not present

## 2020-08-01 DIAGNOSIS — I4821 Permanent atrial fibrillation: Secondary | ICD-10-CM | POA: Diagnosis not present

## 2020-08-01 DIAGNOSIS — N1831 Chronic kidney disease, stage 3a: Secondary | ICD-10-CM | POA: Diagnosis not present

## 2020-08-01 DIAGNOSIS — Z7901 Long term (current) use of anticoagulants: Secondary | ICD-10-CM | POA: Diagnosis not present

## 2020-08-01 DIAGNOSIS — E43 Unspecified severe protein-calorie malnutrition: Secondary | ICD-10-CM | POA: Diagnosis not present

## 2020-08-01 DIAGNOSIS — I081 Rheumatic disorders of both mitral and tricuspid valves: Secondary | ICD-10-CM | POA: Diagnosis not present

## 2020-08-01 DIAGNOSIS — I4891 Unspecified atrial fibrillation: Secondary | ICD-10-CM | POA: Diagnosis not present

## 2020-08-02 DIAGNOSIS — I081 Rheumatic disorders of both mitral and tricuspid valves: Secondary | ICD-10-CM | POA: Diagnosis not present

## 2020-08-02 DIAGNOSIS — N1831 Chronic kidney disease, stage 3a: Secondary | ICD-10-CM | POA: Diagnosis not present

## 2020-08-02 DIAGNOSIS — I272 Pulmonary hypertension, unspecified: Secondary | ICD-10-CM | POA: Diagnosis not present

## 2020-08-02 DIAGNOSIS — R627 Adult failure to thrive: Secondary | ICD-10-CM | POA: Diagnosis not present

## 2020-08-02 DIAGNOSIS — E43 Unspecified severe protein-calorie malnutrition: Secondary | ICD-10-CM | POA: Diagnosis not present

## 2020-08-02 DIAGNOSIS — I4821 Permanent atrial fibrillation: Secondary | ICD-10-CM | POA: Diagnosis not present

## 2020-08-02 DIAGNOSIS — D508 Other iron deficiency anemias: Secondary | ICD-10-CM | POA: Diagnosis not present

## 2020-08-02 DIAGNOSIS — I495 Sick sinus syndrome: Secondary | ICD-10-CM | POA: Diagnosis not present

## 2020-08-02 DIAGNOSIS — I5033 Acute on chronic diastolic (congestive) heart failure: Secondary | ICD-10-CM | POA: Diagnosis not present

## 2020-08-02 DIAGNOSIS — Z7901 Long term (current) use of anticoagulants: Secondary | ICD-10-CM | POA: Diagnosis not present

## 2020-08-03 DIAGNOSIS — I495 Sick sinus syndrome: Secondary | ICD-10-CM | POA: Diagnosis not present

## 2020-08-03 DIAGNOSIS — G9341 Metabolic encephalopathy: Secondary | ICD-10-CM | POA: Diagnosis not present

## 2020-08-03 DIAGNOSIS — I081 Rheumatic disorders of both mitral and tricuspid valves: Secondary | ICD-10-CM | POA: Diagnosis not present

## 2020-08-03 DIAGNOSIS — N1831 Chronic kidney disease, stage 3a: Secondary | ICD-10-CM | POA: Diagnosis not present

## 2020-08-03 DIAGNOSIS — Z7901 Long term (current) use of anticoagulants: Secondary | ICD-10-CM | POA: Diagnosis not present

## 2020-08-03 DIAGNOSIS — E43 Unspecified severe protein-calorie malnutrition: Secondary | ICD-10-CM | POA: Diagnosis not present

## 2020-08-03 DIAGNOSIS — D508 Other iron deficiency anemias: Secondary | ICD-10-CM | POA: Diagnosis not present

## 2020-08-03 DIAGNOSIS — R627 Adult failure to thrive: Secondary | ICD-10-CM | POA: Diagnosis not present

## 2020-08-03 DIAGNOSIS — I5033 Acute on chronic diastolic (congestive) heart failure: Secondary | ICD-10-CM | POA: Diagnosis not present

## 2020-08-03 DIAGNOSIS — I4821 Permanent atrial fibrillation: Secondary | ICD-10-CM | POA: Diagnosis not present

## 2020-08-03 DIAGNOSIS — I272 Pulmonary hypertension, unspecified: Secondary | ICD-10-CM | POA: Diagnosis not present

## 2020-08-07 DIAGNOSIS — D508 Other iron deficiency anemias: Secondary | ICD-10-CM | POA: Diagnosis not present

## 2020-08-07 DIAGNOSIS — I4821 Permanent atrial fibrillation: Secondary | ICD-10-CM | POA: Diagnosis not present

## 2020-08-07 DIAGNOSIS — R627 Adult failure to thrive: Secondary | ICD-10-CM | POA: Diagnosis not present

## 2020-08-07 DIAGNOSIS — N1831 Chronic kidney disease, stage 3a: Secondary | ICD-10-CM | POA: Diagnosis not present

## 2020-08-07 DIAGNOSIS — I081 Rheumatic disorders of both mitral and tricuspid valves: Secondary | ICD-10-CM | POA: Diagnosis not present

## 2020-08-07 DIAGNOSIS — I495 Sick sinus syndrome: Secondary | ICD-10-CM | POA: Diagnosis not present

## 2020-08-07 DIAGNOSIS — I272 Pulmonary hypertension, unspecified: Secondary | ICD-10-CM | POA: Diagnosis not present

## 2020-08-07 DIAGNOSIS — Z7901 Long term (current) use of anticoagulants: Secondary | ICD-10-CM | POA: Diagnosis not present

## 2020-08-07 DIAGNOSIS — E43 Unspecified severe protein-calorie malnutrition: Secondary | ICD-10-CM | POA: Diagnosis not present

## 2020-08-07 DIAGNOSIS — I5033 Acute on chronic diastolic (congestive) heart failure: Secondary | ICD-10-CM | POA: Diagnosis not present

## 2020-08-08 DIAGNOSIS — I5033 Acute on chronic diastolic (congestive) heart failure: Secondary | ICD-10-CM | POA: Diagnosis not present

## 2020-08-08 DIAGNOSIS — I272 Pulmonary hypertension, unspecified: Secondary | ICD-10-CM | POA: Diagnosis not present

## 2020-08-08 DIAGNOSIS — I081 Rheumatic disorders of both mitral and tricuspid valves: Secondary | ICD-10-CM | POA: Diagnosis not present

## 2020-08-08 DIAGNOSIS — D508 Other iron deficiency anemias: Secondary | ICD-10-CM | POA: Diagnosis not present

## 2020-08-08 DIAGNOSIS — I495 Sick sinus syndrome: Secondary | ICD-10-CM | POA: Diagnosis not present

## 2020-08-08 DIAGNOSIS — N1831 Chronic kidney disease, stage 3a: Secondary | ICD-10-CM | POA: Diagnosis not present

## 2020-08-08 DIAGNOSIS — E43 Unspecified severe protein-calorie malnutrition: Secondary | ICD-10-CM | POA: Diagnosis not present

## 2020-08-08 DIAGNOSIS — I4821 Permanent atrial fibrillation: Secondary | ICD-10-CM | POA: Diagnosis not present

## 2020-08-08 DIAGNOSIS — R627 Adult failure to thrive: Secondary | ICD-10-CM | POA: Diagnosis not present

## 2020-08-08 DIAGNOSIS — Z7901 Long term (current) use of anticoagulants: Secondary | ICD-10-CM | POA: Diagnosis not present

## 2020-08-09 DIAGNOSIS — I4821 Permanent atrial fibrillation: Secondary | ICD-10-CM | POA: Diagnosis not present

## 2020-08-09 DIAGNOSIS — E43 Unspecified severe protein-calorie malnutrition: Secondary | ICD-10-CM | POA: Diagnosis not present

## 2020-08-09 DIAGNOSIS — Z7901 Long term (current) use of anticoagulants: Secondary | ICD-10-CM | POA: Diagnosis not present

## 2020-08-09 DIAGNOSIS — N1831 Chronic kidney disease, stage 3a: Secondary | ICD-10-CM | POA: Diagnosis not present

## 2020-08-09 DIAGNOSIS — I5033 Acute on chronic diastolic (congestive) heart failure: Secondary | ICD-10-CM | POA: Diagnosis not present

## 2020-08-09 DIAGNOSIS — I272 Pulmonary hypertension, unspecified: Secondary | ICD-10-CM | POA: Diagnosis not present

## 2020-08-09 DIAGNOSIS — I495 Sick sinus syndrome: Secondary | ICD-10-CM | POA: Diagnosis not present

## 2020-08-09 DIAGNOSIS — I081 Rheumatic disorders of both mitral and tricuspid valves: Secondary | ICD-10-CM | POA: Diagnosis not present

## 2020-08-09 DIAGNOSIS — D508 Other iron deficiency anemias: Secondary | ICD-10-CM | POA: Diagnosis not present

## 2020-08-09 DIAGNOSIS — R627 Adult failure to thrive: Secondary | ICD-10-CM | POA: Diagnosis not present

## 2020-08-11 DIAGNOSIS — Z7901 Long term (current) use of anticoagulants: Secondary | ICD-10-CM | POA: Diagnosis not present

## 2020-08-11 DIAGNOSIS — D508 Other iron deficiency anemias: Secondary | ICD-10-CM | POA: Diagnosis not present

## 2020-08-11 DIAGNOSIS — I4821 Permanent atrial fibrillation: Secondary | ICD-10-CM | POA: Diagnosis not present

## 2020-08-11 DIAGNOSIS — I081 Rheumatic disorders of both mitral and tricuspid valves: Secondary | ICD-10-CM | POA: Diagnosis not present

## 2020-08-11 DIAGNOSIS — I495 Sick sinus syndrome: Secondary | ICD-10-CM | POA: Diagnosis not present

## 2020-08-11 DIAGNOSIS — I5033 Acute on chronic diastolic (congestive) heart failure: Secondary | ICD-10-CM | POA: Diagnosis not present

## 2020-08-11 DIAGNOSIS — R627 Adult failure to thrive: Secondary | ICD-10-CM | POA: Diagnosis not present

## 2020-08-11 DIAGNOSIS — N1831 Chronic kidney disease, stage 3a: Secondary | ICD-10-CM | POA: Diagnosis not present

## 2020-08-11 DIAGNOSIS — E43 Unspecified severe protein-calorie malnutrition: Secondary | ICD-10-CM | POA: Diagnosis not present

## 2020-08-11 DIAGNOSIS — I272 Pulmonary hypertension, unspecified: Secondary | ICD-10-CM | POA: Diagnosis not present

## 2020-08-14 DIAGNOSIS — G301 Alzheimer's disease with late onset: Secondary | ICD-10-CM | POA: Diagnosis not present

## 2020-08-14 DIAGNOSIS — Z7189 Other specified counseling: Secondary | ICD-10-CM | POA: Diagnosis not present

## 2020-08-14 DIAGNOSIS — R69 Illness, unspecified: Secondary | ICD-10-CM | POA: Diagnosis not present

## 2020-08-28 DIAGNOSIS — I340 Nonrheumatic mitral (valve) insufficiency: Secondary | ICD-10-CM | POA: Diagnosis not present

## 2020-08-28 DIAGNOSIS — I5032 Chronic diastolic (congestive) heart failure: Secondary | ICD-10-CM | POA: Diagnosis not present

## 2020-08-28 DIAGNOSIS — G301 Alzheimer's disease with late onset: Secondary | ICD-10-CM | POA: Diagnosis not present

## 2020-08-28 DIAGNOSIS — R69 Illness, unspecified: Secondary | ICD-10-CM | POA: Diagnosis not present

## 2020-09-11 DIAGNOSIS — S0081XD Abrasion of other part of head, subsequent encounter: Secondary | ICD-10-CM | POA: Diagnosis not present

## 2020-09-11 DIAGNOSIS — G309 Alzheimer's disease, unspecified: Secondary | ICD-10-CM | POA: Diagnosis not present

## 2020-09-11 DIAGNOSIS — W19XXXD Unspecified fall, subsequent encounter: Secondary | ICD-10-CM | POA: Diagnosis not present

## 2020-09-11 DIAGNOSIS — R69 Illness, unspecified: Secondary | ICD-10-CM | POA: Diagnosis not present

## 2020-10-14 DEATH — deceased
